# Patient Record
Sex: Female | Born: 1985 | Race: Black or African American | Hispanic: No | Marital: Single | Smoking: Never smoker
Health system: Southern US, Community
[De-identification: ages and names within clinical notes are randomized; demographics above are authoritative.]

## PROBLEM LIST (undated history)

## (undated) ENCOUNTER — Inpatient Hospital Stay (HOSPITAL_COMMUNITY): Payer: Self-pay

## (undated) ENCOUNTER — Inpatient Hospital Stay (HOSPITAL_COMMUNITY): Payer: Medicaid Other

## (undated) DIAGNOSIS — A549 Gonococcal infection, unspecified: Secondary | ICD-10-CM

## (undated) DIAGNOSIS — D649 Anemia, unspecified: Secondary | ICD-10-CM

## (undated) DIAGNOSIS — A749 Chlamydial infection, unspecified: Secondary | ICD-10-CM

## (undated) DIAGNOSIS — R87619 Unspecified abnormal cytological findings in specimens from cervix uteri: Secondary | ICD-10-CM

## (undated) DIAGNOSIS — N390 Urinary tract infection, site not specified: Secondary | ICD-10-CM

## (undated) DIAGNOSIS — IMO0002 Reserved for concepts with insufficient information to code with codable children: Secondary | ICD-10-CM

## (undated) HISTORY — PX: NO PAST SURGERIES: SHX2092

---

## 2008-08-22 ENCOUNTER — Ambulatory Visit (HOSPITAL_COMMUNITY): Admission: RE | Admit: 2008-08-22 | Discharge: 2008-08-22 | Payer: Self-pay | Admitting: Obstetrics and Gynecology

## 2008-10-03 ENCOUNTER — Ambulatory Visit (HOSPITAL_COMMUNITY): Admission: RE | Admit: 2008-10-03 | Discharge: 2008-10-03 | Payer: Self-pay | Admitting: Obstetrics and Gynecology

## 2008-11-01 ENCOUNTER — Ambulatory Visit (HOSPITAL_COMMUNITY): Admission: RE | Admit: 2008-11-01 | Discharge: 2008-11-01 | Payer: Self-pay | Admitting: Obstetrics and Gynecology

## 2008-11-21 ENCOUNTER — Inpatient Hospital Stay (HOSPITAL_COMMUNITY): Admission: AD | Admit: 2008-11-21 | Discharge: 2008-11-21 | Payer: Self-pay | Admitting: Obstetrics and Gynecology

## 2008-11-29 ENCOUNTER — Ambulatory Visit (HOSPITAL_COMMUNITY): Admission: RE | Admit: 2008-11-29 | Discharge: 2008-11-29 | Payer: Self-pay | Admitting: Obstetrics and Gynecology

## 2008-12-06 ENCOUNTER — Inpatient Hospital Stay (HOSPITAL_COMMUNITY): Admission: AD | Admit: 2008-12-06 | Discharge: 2008-12-07 | Payer: Self-pay | Admitting: Obstetrics and Gynecology

## 2008-12-14 ENCOUNTER — Inpatient Hospital Stay (HOSPITAL_COMMUNITY): Admission: AD | Admit: 2008-12-14 | Discharge: 2008-12-15 | Payer: Self-pay | Admitting: Obstetrics and Gynecology

## 2008-12-17 ENCOUNTER — Inpatient Hospital Stay (HOSPITAL_COMMUNITY): Admission: AD | Admit: 2008-12-17 | Discharge: 2008-12-17 | Payer: Self-pay | Admitting: Obstetrics and Gynecology

## 2008-12-28 ENCOUNTER — Inpatient Hospital Stay (HOSPITAL_COMMUNITY): Admission: AD | Admit: 2008-12-28 | Discharge: 2008-12-29 | Payer: Self-pay | Admitting: Obstetrics and Gynecology

## 2010-06-29 ENCOUNTER — Encounter: Payer: Self-pay | Admitting: Obstetrics and Gynecology

## 2010-07-22 IMAGING — US US OB DETAIL+14 WK
1 series · 14 of 28 positions shown · non-contrast
Comparison: none

OBSTETRICAL ULTRASOUND:
 This ultrasound was performed in The [HOSPITAL], and the [facility] report will be stored to [REDACTED] PACS.

[Series 1: us ob detail+14 wk · 14 of 92 slices shown]
[im 4/92]
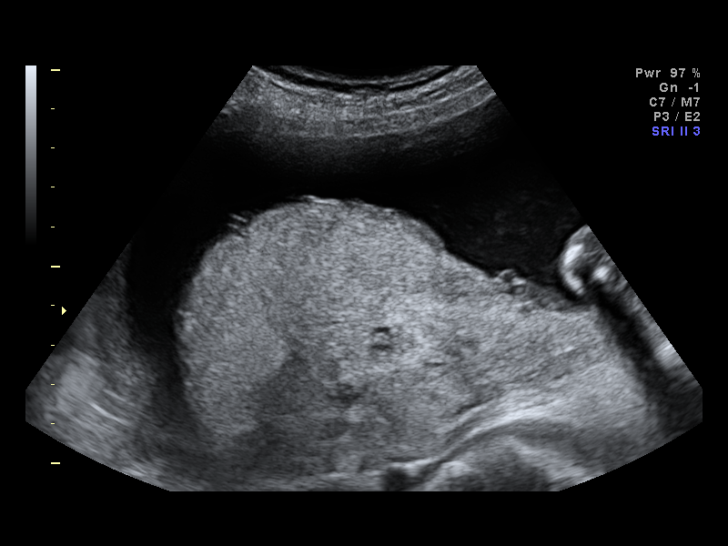
[im 11/92]
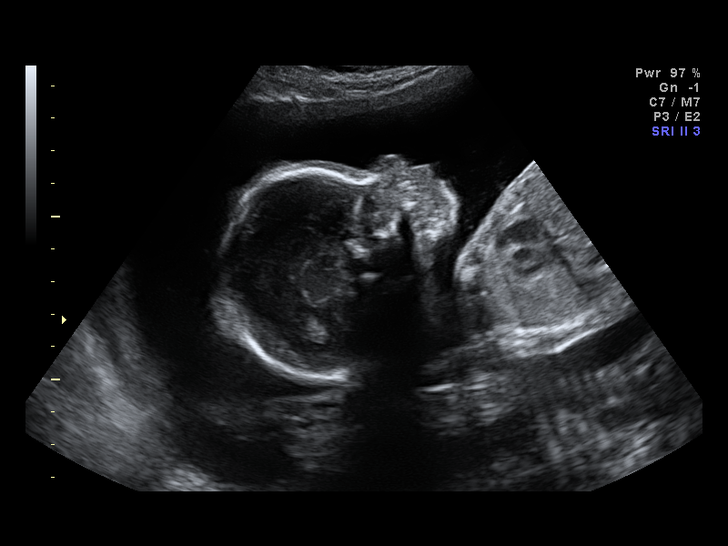
[im 17/92]
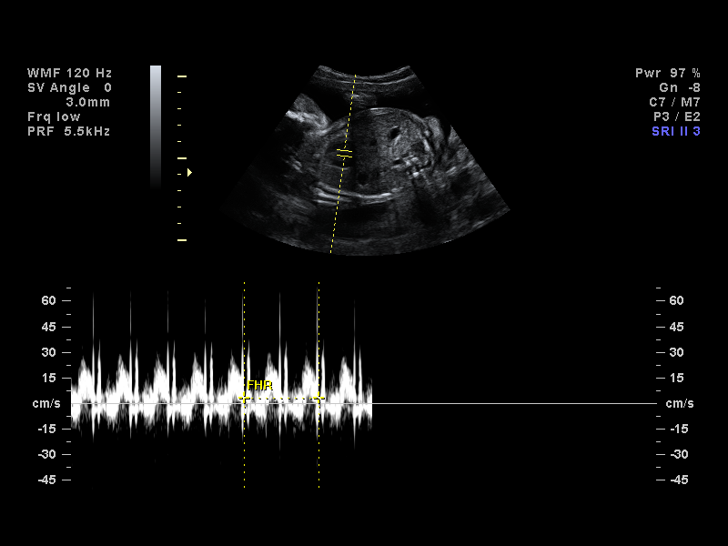
[im 24/92]
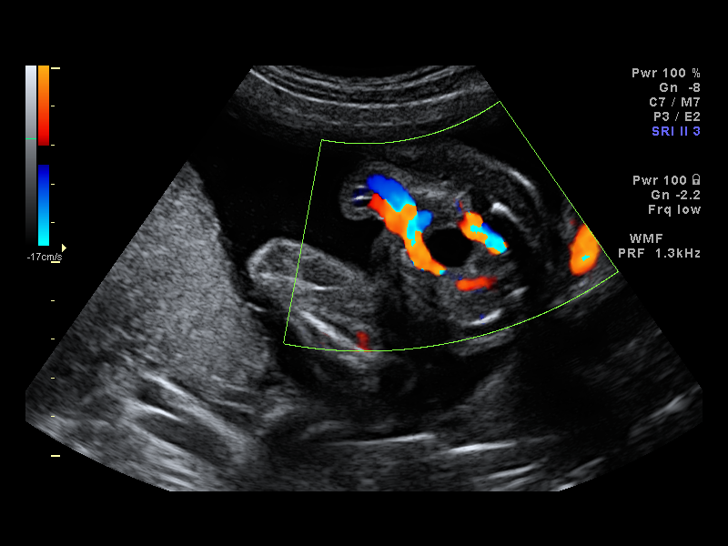
[im 31/92]
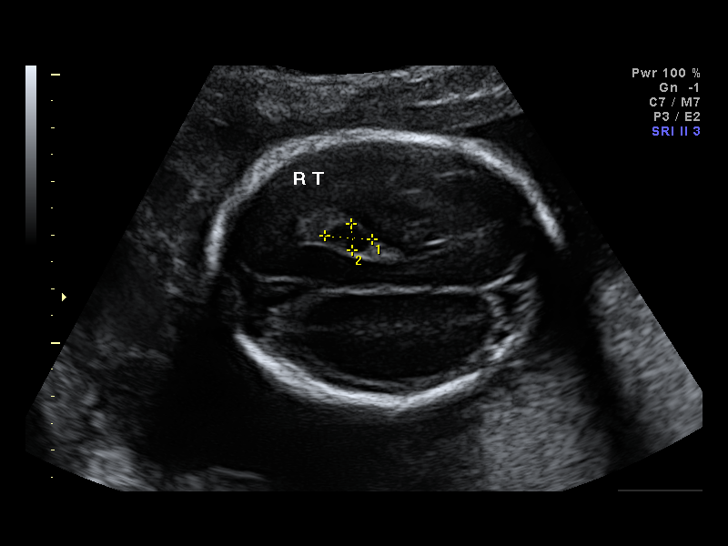
[im 38/92]
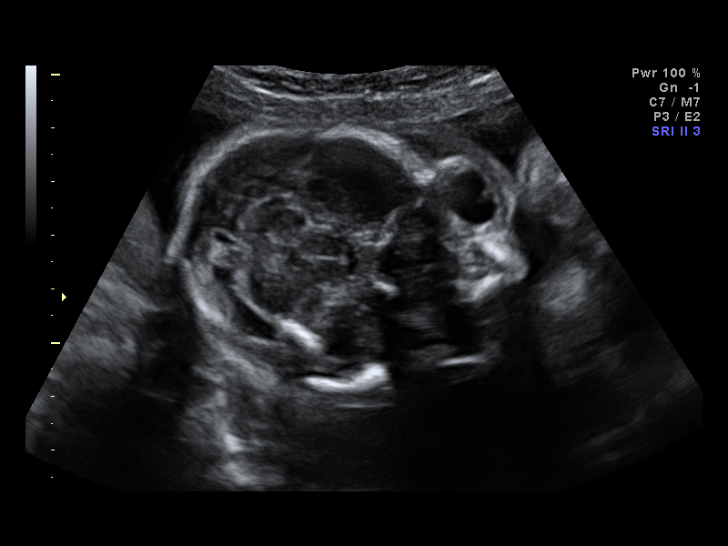
[im 44/92]
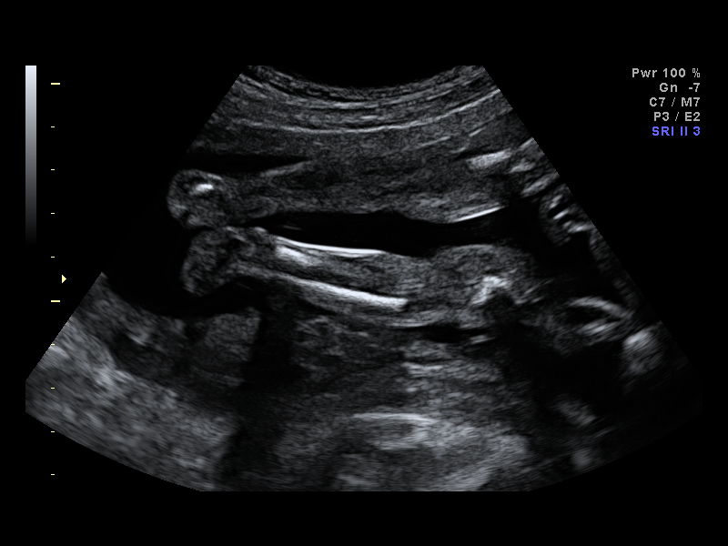
[im 51/92]
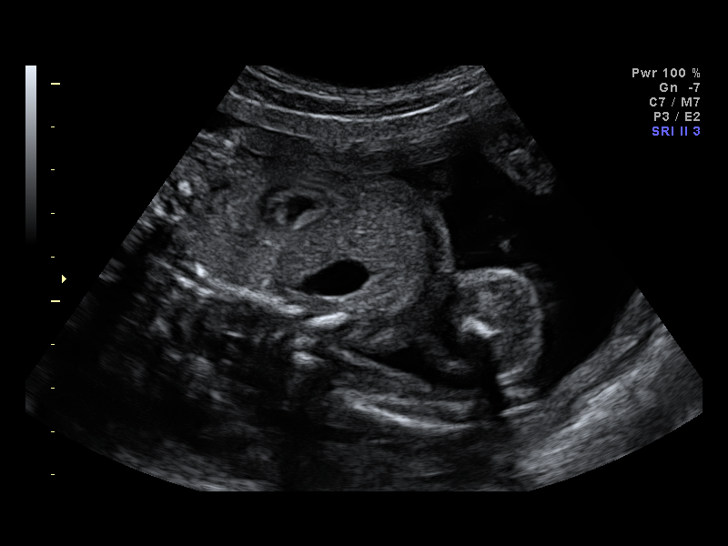
[im 58/92]
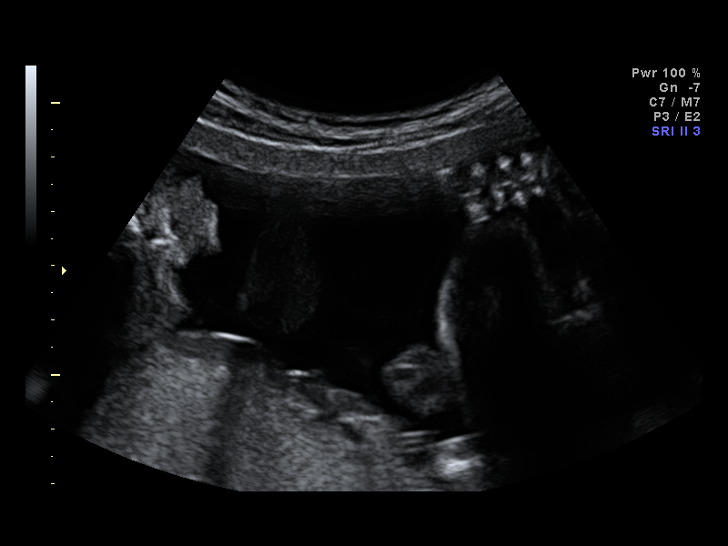
[im 65/92]
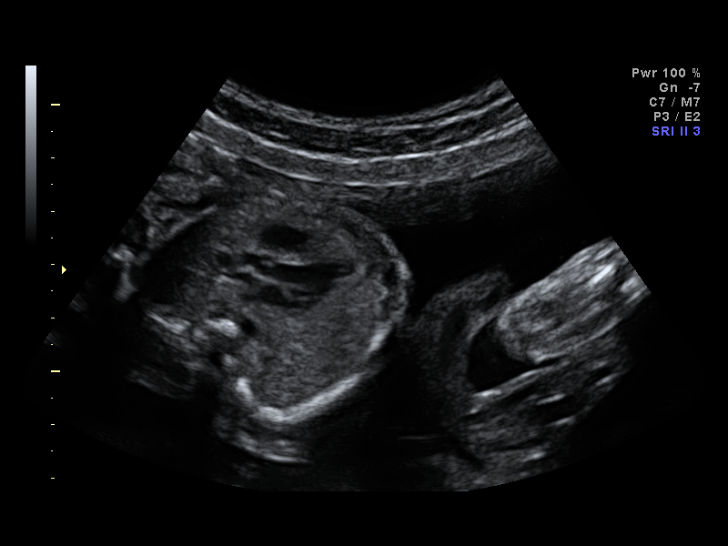
[im 71/92]
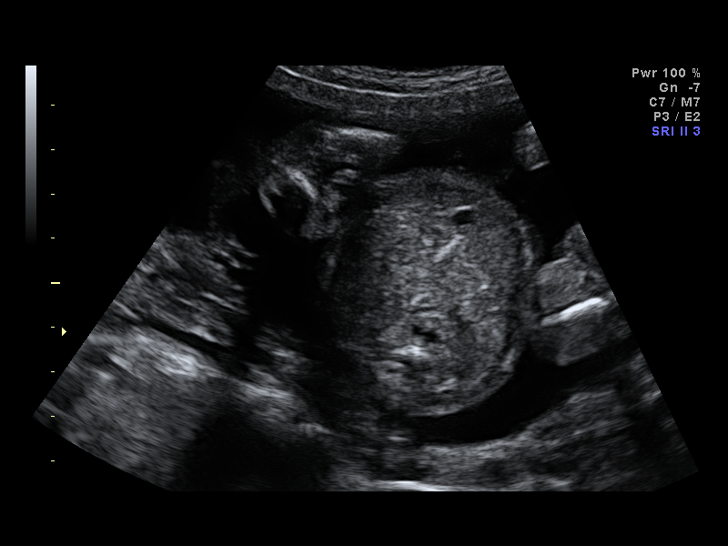
[im 78/92]
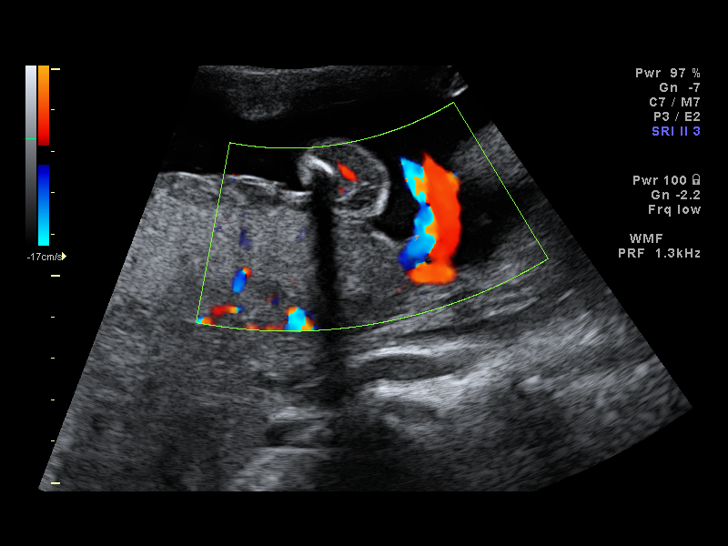
[im 85/92]
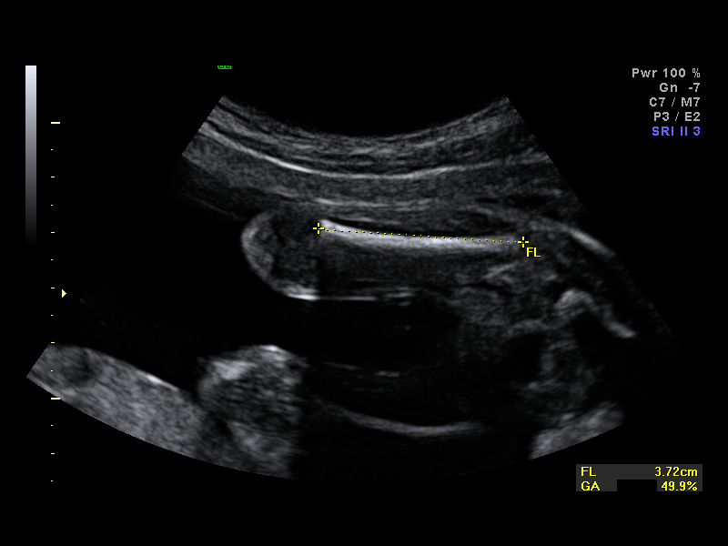
[im 92/92]
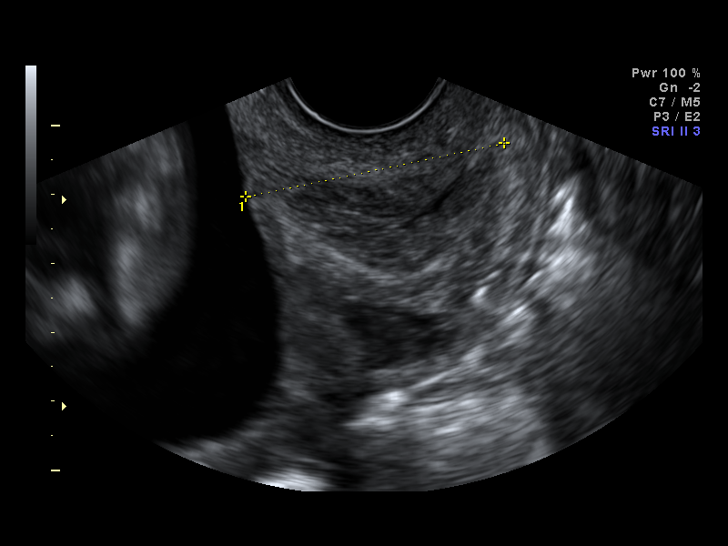

[14 of 28 positions shown; findings below may reference images not displayed]

IMPRESSION: [facility] has also been faxed to the ordering physician.

## 2010-09-14 LAB — URINALYSIS, ROUTINE W REFLEX MICROSCOPIC
Bilirubin Urine: NEGATIVE
Bilirubin Urine: NEGATIVE
Glucose, UA: NEGATIVE mg/dL
Ketones, ur: NEGATIVE mg/dL
Nitrite: NEGATIVE
Protein, ur: NEGATIVE mg/dL
Specific Gravity, Urine: 1.01 (ref 1.005–1.030)
Urobilinogen, UA: 0.2 mg/dL (ref 0.0–1.0)
pH: 7 (ref 5.0–8.0)

## 2010-09-14 LAB — CBC
HCT: 33.1 % — ABNORMAL LOW (ref 36.0–46.0)
Hemoglobin: 14.2 g/dL (ref 12.0–15.0)
MCV: 79.5 fL (ref 78.0–100.0)
RBC: 4.17 MIL/uL (ref 3.87–5.11)
RBC: 5.56 MIL/uL — ABNORMAL HIGH (ref 3.87–5.11)
WBC: 6.8 10*3/uL (ref 4.0–10.5)
WBC: 8.9 10*3/uL (ref 4.0–10.5)

## 2010-09-14 LAB — URINE MICROSCOPIC-ADD ON

## 2010-10-21 NOTE — Discharge Summary (Signed)
NAMEBROOKLYNN, BRANDENBURG NO.:  [id number]   MEDICAL RECORD NO.:  [id number]          PATIENT TYPE:  INP   LOCATION:  9124                          FACILITY:  [facility]   PHYSICIAN:  Arlyce Harman, MD     DATE OF BIRTH:  Dec 22, 1985   DATE OF ADMISSION:  12/28/2008  DATE OF DISCHARGE:  12/29/2008                               DISCHARGE SUMMARY   Ms. Carrie Bean was admitted on December 28, 2008, in active laborL Labor  progressed well and she delivered a healthy viable female with Apgars of  9 and 9. Weight was later found to be 8 pounds.  She delivered early on  the morning of December 28, 2008.   HOSPITAL COURSE:  Hospital course has been uncomplicated and she is  being discharged today in excellent condition and will be followed up in  our office in 5-6 weeks for postpartum exam.  Hemoglobin on October 23,  was 10.8, hematocrit 33.1.   FINAL DIAGNOSIS:  Term pregnancy that ended in a viable healthy female  infant via  normal spontaneous vaginal delivery.   SIGNIFICANT FINDINGS:  No episiotomy and no tears.   PROCEDURE PERFORMED:  Normal spontaneous vaginal delivery.   CONDITION:  The patient has been discharged in an excellent condition.  She is afebrile and she is voiding well and ambulatory.   Instructions given to the patient:  She is to have bedrest for the next  2-3 weeks with bathroom privileges and kitchen privileges.  She is not  to lift over 10 pounds for the next 2-3 weeks.  She is not to engage in  sexual intercourse for 6 weeks.  She is to follow up in our office in 5-  6 weeks.   MEDICATIONS:  1. Prenatal vitamin 1 a day.  2. Iron chromogen 1 a day.      Arlyce Harman, MD  Electronically Signed     EG/MEDQ  D:  12/29/2008  T:  12/29/2008  Job:  [id number]

## 2011-08-12 ENCOUNTER — Other Ambulatory Visit (HOSPITAL_COMMUNITY)
Admission: RE | Admit: 2011-08-12 | Discharge: 2011-08-12 | Disposition: A | Discharge status: Home / Self Care | Payer: Medicaid Other | Source: Ambulatory Visit | Attending: Obstetrics & Gynecology | Admitting: Obstetrics & Gynecology

## 2011-08-12 DIAGNOSIS — Z124 Encounter for screening for malignant neoplasm of cervix: Secondary | ICD-10-CM | POA: Insufficient documentation

## 2011-08-12 DIAGNOSIS — Z113 Encounter for screening for infections with a predominantly sexual mode of transmission: Secondary | ICD-10-CM | POA: Insufficient documentation

## 2011-08-25 LAB — OB RESULTS CONSOLE RUBELLA ANTIBODY, IGM: Rubella: IMMUNE

## 2011-08-25 LAB — OB RESULTS CONSOLE ANTIBODY SCREEN: Antibody Screen: NEGATIVE

## 2011-08-25 LAB — OB RESULTS CONSOLE ABO/RH: RH Type: POSITIVE

## 2011-09-11 ENCOUNTER — Ambulatory Visit: Payer: Self-pay | Admitting: Dietician

## 2011-12-31 ENCOUNTER — Inpatient Hospital Stay (HOSPITAL_COMMUNITY)
Admission: AD | Admit: 2011-12-31 | Discharge: 2012-01-01 | Disposition: A | Discharge status: Home / Self Care | Payer: Medicaid Other | Source: Ambulatory Visit | Attending: Obstetrics & Gynecology | Admitting: Obstetrics & Gynecology

## 2011-12-31 ENCOUNTER — Encounter (HOSPITAL_COMMUNITY): Payer: Self-pay | Admitting: *Deleted

## 2011-12-31 DIAGNOSIS — N760 Acute vaginitis: Secondary | ICD-10-CM | POA: Insufficient documentation

## 2011-12-31 DIAGNOSIS — N72 Inflammatory disease of cervix uteri: Secondary | ICD-10-CM | POA: Insufficient documentation

## 2011-12-31 DIAGNOSIS — O479 False labor, unspecified: Secondary | ICD-10-CM

## 2011-12-31 DIAGNOSIS — O99891 Other specified diseases and conditions complicating pregnancy: Secondary | ICD-10-CM | POA: Insufficient documentation

## 2011-12-31 DIAGNOSIS — B9689 Other specified bacterial agents as the cause of diseases classified elsewhere: Secondary | ICD-10-CM | POA: Insufficient documentation

## 2011-12-31 DIAGNOSIS — O2390 Unspecified genitourinary tract infection in pregnancy, unspecified trimester: Secondary | ICD-10-CM | POA: Insufficient documentation

## 2011-12-31 DIAGNOSIS — R209 Unspecified disturbances of skin sensation: Secondary | ICD-10-CM | POA: Insufficient documentation

## 2011-12-31 DIAGNOSIS — Z3490 Encounter for supervision of normal pregnancy, unspecified, unspecified trimester: Secondary | ICD-10-CM

## 2011-12-31 DIAGNOSIS — N949 Unspecified condition associated with female genital organs and menstrual cycle: Secondary | ICD-10-CM | POA: Insufficient documentation

## 2011-12-31 DIAGNOSIS — A499 Bacterial infection, unspecified: Secondary | ICD-10-CM | POA: Insufficient documentation

## 2011-12-31 LAB — WET PREP, GENITAL

## 2011-12-31 NOTE — MAU Note (Signed)
PT SAYS AT 8PM - SHE STARTED HAVING NUMBNESS IN L ELBOW-  WENT TO HER WRIST - THEN DOWN L OUTER SIDE.  BUT NOW--    ALL NUMBNESS IS GONE.       SAYS  X1 MTH HAS HAD PAIN/ PRESSURE IN VAGINA-   WHEN SHE WALKS .   WAS IN OFFICE ON Monday FOR P- SHOTS- TOLD THEM ABOUT ABOUT VAGINA-   VE - CLOSED.      PT CALLED EMS TONIGHT - ASSESSED- BUT MOM BROUGHT HER TO HOSPITAL.

## 2011-12-31 NOTE — MAU Provider Note (Signed)
History     CSN: [id number]  Arrival date and time: 12/31/11 2136   First Provider Initiated Contact with Patient 12/31/11 2316      Chief Complaint  Patient presents with  . Numbness    on left side  . Abdominal Pain    vaginal pressure   HPI Carrie Bean is a 26 y.o. female @ [redacted]w[redacted]d gestation who presents to MAU for vaginal pain. She describes the pain as sharp. The pain has been off and on for over a month. She told her doctor about it at her last office visit and he checked her and assured her that everything was normal and cervix closed. Tonight she called EMS to her home because she had an episode of tingling in her left elbow that radiated to her hand and up to her shoulder. She also had tingling in her left side. The pain resolved when EMS got there. They did their evaluation and told her her blood pressure was normal and she checked out ok. Her mother drove her to MAU. She denies any pain, numbness or tingling. She denies vaginal bleeding or leaking of fluid. The history was provided by the patient.  OB History    Grav Para Term Preterm Abortions TAB SAB Ect Mult Living   3 2 1 1      2       Past Medical History  Diagnosis Date  . No pertinent past medical history     Past Surgical History  Procedure Date  . No past surgeries     Family History  Problem Relation Age of Onset  . Other Neg Hx     History  Substance Use Topics  . Smoking status: Never Smoker   . Smokeless tobacco: Not on file  . Alcohol Use: No    Allergies: No Known Allergies  Prescriptions prior to admission  Medication Sig Dispense Refill  . Prenatal Vit-Fe Fumarate-FA (PRENATAL MULTIVITAMIN) TABS Take 1 tablet by mouth daily.        ROS: As stated in HPI    Blood pressure 112/64, pulse 95, temperature 98.2 F (36.8 C), temperature source Oral, resp. rate 16, height 5\' 10"  (1.778 m), weight 160 lb 3.2 oz (72.666 kg).  Physical Exam  Nursing note and vitals  reviewed. Constitutional: She is oriented to person, place, and time. She appears well-developed and well-nourished. No distress.  HENT:  Head: Normocephalic and atraumatic.  Eyes: EOM are normal.  Neck: Neck supple.  Cardiovascular: Normal rate.   Respiratory: Effort normal.  GI: Soft. There is no tenderness.  Genitourinary:       External genitalia without lesions. White discharge vaginal vault. Cervix inflamed, positive CMT, closed, thick, high. Uterus consistent with dates.  Musculoskeletal: Normal range of motion.  Neurological: She is alert and oriented to person, place, and time.  Skin: Skin is warm and dry.  Psychiatric: She has a normal mood and affect. Her behavior is normal. Judgment and thought content normal.   Results for orders placed during the hospital encounter of 12/31/11 (from the past 24 hour(s))  WET PREP, GENITAL     Status: Abnormal   Collection Time   12/31/11 11:20 PM      Component Value Range   Yeast Wet Prep HPF POC NONE SEEN  NONE SEEN   Trich, Wet Prep NONE SEEN  NONE SEEN   Clue Cells Wet Prep HPF POC MODERATE (*) NONE SEEN   WBC, Wet Prep HPF POC MANY (*) NONE  SEEN    Assessment: 26 y.o. female @ [redacted]w[redacted]d gestation with    Cervicitis   Bacterial vaginosis    Vaginal pain  Plan:  Rx Flagyl   Zithromax 1 gram po now   GC, Chlamydia cultures pending   Call the office for follow up, return here as needed.  I have reviewed this patient's vital signs, nurses notes and appropriate labs. I have discussed the clinical and lab finding with Dr. Christell Constant. He request we treat the patient for BV and have her follow up in the office.  I have discussed the results with the patient and plan of care. Patient voices understanding.   Procedures EFM baseline 130's, reactive tracing, 2 contractions in one hour   NEESE,HOPE, RN, FNP, [facility] 12/31/2011, 11:36 PM

## 2011-12-31 NOTE — MAU Note (Signed)
Home resting and left arm went numb.  Having abdominal soreness and vaginal pressure.  Started about 1 hour ago

## 2012-01-01 LAB — GC/CHLAMYDIA PROBE AMP, GENITAL: Chlamydia, DNA Probe: NEGATIVE

## 2012-01-01 MED ORDER — AZITHROMYCIN 250 MG PO TABS
1000.0000 mg | ORAL_TABLET | Freq: Once | ORAL | Status: AC
  Administered 2012-01-01: 1000 mg via ORAL
  Filled 2012-01-01: qty 4

## 2012-01-01 MED ORDER — METRONIDAZOLE 500 MG PO TABS: 500.0000 mg | ORAL_TABLET | Freq: Two times a day (BID) | ORAL | Status: AC

## 2012-01-01 NOTE — MAU Provider Note (Signed)
Agree with treatment and plan.

## 2012-01-07 ENCOUNTER — Inpatient Hospital Stay (HOSPITAL_COMMUNITY)
Admission: AD | Admit: 2012-01-07 | Discharge: 2012-01-07 | Disposition: A | Discharge status: Home / Self Care | Payer: Medicaid Other | Source: Ambulatory Visit | Attending: Obstetrics & Gynecology | Admitting: Obstetrics & Gynecology

## 2012-01-07 DIAGNOSIS — O4700 False labor before 37 completed weeks of gestation, unspecified trimester: Secondary | ICD-10-CM | POA: Insufficient documentation

## 2012-01-07 MED ORDER — BETAMETHASONE SOD PHOS & ACET 6 (3-3) MG/ML IJ SUSP
12.0000 mg | Freq: Once | INTRAMUSCULAR | Status: AC
  Administered 2012-01-07: 12 mg via INTRAMUSCULAR
  Filled 2012-01-07: qty 2

## 2012-01-08 ENCOUNTER — Encounter (HOSPITAL_COMMUNITY): Payer: Self-pay | Admitting: *Deleted

## 2012-01-08 ENCOUNTER — Inpatient Hospital Stay (HOSPITAL_COMMUNITY)
Admission: AD | Admit: 2012-01-08 | Discharge: 2012-01-08 | Disposition: A | Discharge status: Home / Self Care | Payer: Medicaid Other | Source: Ambulatory Visit | Attending: Obstetrics and Gynecology | Admitting: Obstetrics and Gynecology

## 2012-01-08 DIAGNOSIS — R109 Unspecified abdominal pain: Secondary | ICD-10-CM | POA: Insufficient documentation

## 2012-01-08 DIAGNOSIS — O99891 Other specified diseases and conditions complicating pregnancy: Secondary | ICD-10-CM | POA: Insufficient documentation

## 2012-01-08 DIAGNOSIS — O26899 Other specified pregnancy related conditions, unspecified trimester: Secondary | ICD-10-CM

## 2012-01-08 DIAGNOSIS — R1030 Lower abdominal pain, unspecified: Secondary | ICD-10-CM

## 2012-01-08 DIAGNOSIS — O9989 Other specified diseases and conditions complicating pregnancy, childbirth and the puerperium: Secondary | ICD-10-CM

## 2012-01-08 MED ORDER — BETAMETHASONE SOD PHOS & ACET 6 (3-3) MG/ML IJ SUSP
12.0000 mg | Freq: Once | INTRAMUSCULAR | Status: AC
  Administered 2012-01-08: 12 mg via INTRAMUSCULAR
  Filled 2012-01-08: qty 2

## 2012-01-08 MED ORDER — LACTATED RINGERS IV SOLN
Freq: Once | INTRAVENOUS | Status: AC
  Administered 2012-01-08: 13:00:00 via INTRAVENOUS

## 2012-01-08 NOTE — MAU Provider Note (Signed)
  History     CSN: [id number]  Arrival date and time: 01/08/12 1019   First Provider Initiated Contact with Patient 01/08/12 1115      Chief Complaint  Patient presents with  . Abdominal Pain   HPI Carrie Bean 25 y.o. [redacted]w[redacted]d  Here for lower abdominal cramping. Was seen yesterday in the office for same. Was given Betamethasone and is to have second dose today. Was not put on tocolytic.  Was 2-3 cm yesterday. Denies leaking or bleeding.   OB History    Grav Para Term Preterm Abortions TAB SAB Ect Mult Living   3 2 1 1      2       Past Medical History  Diagnosis Date  . No pertinent past medical history     Past Surgical History  Procedure Date  . No past surgeries     Family History  Problem Relation Age of Onset  . Other Neg Hx     History  Substance Use Topics  . Smoking status: Never Smoker   . Smokeless tobacco: Not on file  . Alcohol Use: No    Allergies: No Known Allergies  Prescriptions prior to admission  Medication Sig Dispense Refill  . clotrimazole-betamethasone (LOTRISONE) cream Apply 1 application topically 2 (two) times daily.      . fluconazole (DIFLUCAN) 150 MG tablet Take 150 mg by mouth once.      . metroNIDAZOLE (FLAGYL) 500 MG tablet Take 1 tablet (500 mg total) by mouth 2 (two) times daily.  14 tablet  0  . Prenatal Vit-Fe Fumarate-FA (PRENATAL MULTIVITAMIN) TABS Take 1 tablet by mouth daily.        ROS Physical Exam   Blood pressure 111/66, pulse 108, temperature 98.4 F (36.9 C), temperature source Oral, height 5\' 9"  (1.753 m), weight 157 lb 6.4 oz (71.396 kg), SpO2 99.00%.  Physical Exam  Constitutional: She is oriented to person, place, and time. She appears well-developed and well-nourished. No distress.  Cardiovascular: Normal rate.   Respiratory: Effort normal.  GI: Soft. She exhibits no distension and no mass. There is no tenderness. There is no rebound and no guarding.  Musculoskeletal: Normal range of motion.    Neurological: She is alert and oriented to person, place, and time.  Skin: Skin is warm and dry.  Psychiatric: She has a normal mood and affect.        MAU Course  Procedures  MDM Discussed with Dr Neva Seat who ordered IV hydration >> UCs almost totally stopped after hydration. FHR remained reassuring. Discussed with Dr Neva Seat. Will discharge and have her followup in office.  Assessment and Plan  A:  SIUP at [redacted]w[redacted]d       Uterine irritability       P:  Discharge home      Followup in office  BURLESON,TERRI 01/08/2012, 12:13 PM

## 2012-01-08 NOTE — MAU Note (Signed)
Patient states she has been having abdominal pain all over the abdomen since about 0500. States she was seen in the office 8-1 and was 2-3 cm and sent to MAU for her first Betamethasone injection, second due today at about 1630. Denies any bleeding and reports good fetal movement.

## 2012-01-08 NOTE — MAU Note (Signed)
Pt presents for worsening abdominal pain that started at 0445 this morning.  She received first dose of BMZ yesterday, and was to return today at 1645 today for second dose.  Denies any LOF or bleeding.  Reports good fetal movement.

## 2012-01-19 ENCOUNTER — Inpatient Hospital Stay (HOSPITAL_COMMUNITY)
Admission: AD | Admit: 2012-01-19 | Discharge: 2012-01-19 | Disposition: A | Discharge status: Home / Self Care | Payer: Medicaid Other | Source: Ambulatory Visit | Attending: Obstetrics and Gynecology | Admitting: Obstetrics and Gynecology

## 2012-01-19 ENCOUNTER — Encounter (HOSPITAL_COMMUNITY): Payer: Self-pay | Admitting: *Deleted

## 2012-01-19 DIAGNOSIS — R109 Unspecified abdominal pain: Secondary | ICD-10-CM | POA: Insufficient documentation

## 2012-01-19 DIAGNOSIS — O99891 Other specified diseases and conditions complicating pregnancy: Secondary | ICD-10-CM | POA: Insufficient documentation

## 2012-01-19 DIAGNOSIS — N949 Unspecified condition associated with female genital organs and menstrual cycle: Secondary | ICD-10-CM | POA: Insufficient documentation

## 2012-01-19 HISTORY — DX: Reserved for concepts with insufficient information to code with codable children: IMO0002

## 2012-01-19 HISTORY — DX: Urinary tract infection, site not specified: N39.0

## 2012-01-19 HISTORY — DX: Chlamydial infection, unspecified: A74.9

## 2012-01-19 HISTORY — DX: Gonococcal infection, unspecified: A54.9

## 2012-01-19 HISTORY — DX: Unspecified abnormal cytological findings in specimens from cervix uteri: R87.619

## 2012-01-19 LAB — URINE MICROSCOPIC-ADD ON

## 2012-01-19 LAB — URINALYSIS, ROUTINE W REFLEX MICROSCOPIC
Nitrite: NEGATIVE
Specific Gravity, Urine: 1.02 (ref 1.005–1.030)
pH: 6.5 (ref 5.0–8.0)

## 2012-01-19 LAB — FETAL FIBRONECTIN: Fetal Fibronectin: NEGATIVE

## 2012-01-19 NOTE — MAU Note (Signed)
Sharp pains and pressure in vagina. Has been on bedrest, pelvic rest. Brownish d/c today.

## 2012-01-19 NOTE — MAU Note (Signed)
Patient states she has been 3 cm and on bedrest. Received progesterone shots weekly.

## 2012-01-19 NOTE — MAU Provider Note (Signed)
History     CSN: [id number]  Arrival date and time: 01/19/12 1327   First Provider Initiated Contact with Patient 01/19/12 1512      Chief Complaint  Patient presents with  . Abdominal Pain  . Vaginal Pain   HPI 26 y.o. [id number] at [redacted]w[redacted]d c/o vaginal pain and pressure, denies contractions, bleeding, LOF. Pain increased with movement. H/O preterm labor this pregnancy, cervix 3 cm last week, negative FFN either last week or the week prior per patient, on bedrest/pelvic rest at home, has had BMZ this pregnancy. H/O 36 week and term delivery. On 17P injections.     Past Medical History  Diagnosis Date  . Preterm labor   . Urinary tract infection   . Abnormal Pap smear     f/u was normal  . Gonorrhea   . Chlamydia     Past Surgical History  Procedure Date  . No past surgeries     Family History  Problem Relation Age of Onset  . Other Neg Hx   . Hearing loss Neg Hx     History  Substance Use Topics  . Smoking status: Never Smoker   . Smokeless tobacco: Never Used  . Alcohol Use: No    Allergies: No Known Allergies  Prescriptions prior to admission  Medication Sig Dispense Refill  . acetaminophen (TYLENOL) 325 MG tablet Take 650 mg by mouth every 6 (six) hours as needed.      . clotrimazole-betamethasone (LOTRISONE) cream Apply 1 application topically 2 (two) times daily.      . fluconazole (DIFLUCAN) 150 MG tablet Take 150 mg by mouth once.      . Prenatal Vit-Fe Fumarate-FA (PRENATAL MULTIVITAMIN) TABS Take 1 tablet by mouth daily.        Review of Systems  Constitutional: Negative.   Respiratory: Negative.   Cardiovascular: Negative.   Gastrointestinal: Negative for nausea, vomiting, abdominal pain, diarrhea and constipation.  Genitourinary: Negative for dysuria, urgency, frequency, hematuria and flank pain.       Negative for vaginal bleeding, cramping/contractions  Musculoskeletal: Negative.   Neurological: Negative.   Psychiatric/Behavioral: Negative.      Physical Exam   Blood pressure 118/64, pulse 97, temperature 98.2 F (36.8 C), temperature source Oral, resp. rate 16, height 5\' 8"  (1.727 m), weight 159 lb (72.122 kg), SpO2 100.00%.  Physical Exam  Nursing note and vitals reviewed. Constitutional: She is oriented to person, place, and time. She appears well-developed and well-nourished. No distress.  Cardiovascular: Normal rate.   Respiratory: Effort normal.  GI: Soft. There is no tenderness.  Genitourinary: There is no rash, tenderness or lesion on the right labia. There is no rash, tenderness or lesion on the left labia. Cervix exhibits no discharge and no friability. No bleeding around the vagina. Vaginal discharge (clear/white mucous) found.       SVE:3/thick/high  Musculoskeletal: Normal range of motion.  Neurological: She is alert and oriented to person, place, and time.  Skin: Skin is warm and dry.  Psychiatric: She has a normal mood and affect.   NST reactive, TOCO: mild irritability, one contraction MAU Course  Procedures  Results for orders placed during the hospital encounter of 01/19/12 (from the past 24 hour(s))  URINALYSIS, ROUTINE W REFLEX MICROSCOPIC     Status: Abnormal   Collection Time   01/19/12  2:20 PM      Component Value Range   Color, Urine YELLOW  YELLOW   APPearance CLEAR  CLEAR   Specific Gravity, Urine  1.020  1.005 - 1.030   pH 6.5  5.0 - 8.0   Glucose, UA NEGATIVE  NEGATIVE mg/dL   Hgb urine dipstick NEGATIVE  NEGATIVE   Bilirubin Urine NEGATIVE  NEGATIVE   Ketones, ur NEGATIVE  NEGATIVE mg/dL   Protein, ur NEGATIVE  NEGATIVE mg/dL   Urobilinogen, UA 0.2  0.0 - 1.0 mg/dL   Nitrite NEGATIVE  NEGATIVE   Leukocytes, UA TRACE (*) NEGATIVE  URINE MICROSCOPIC-ADD ON     Status: Abnormal   Collection Time   01/19/12  2:20 PM      Component Value Range   Squamous Epithelial / LPF FEW (*) RARE   WBC, UA 0-2  <3 WBC/hpf  FETAL FIBRONECTIN     Status: Normal   Collection Time   01/19/12  3:10 PM       Component Value Range   Fetal Fibronectin NEGATIVE  NEGATIVE     Assessment and Plan  26 y.o. [id number] at [redacted]w[redacted]d Round ligament pain Continue current plan of care, follow up as scheduled in office Precautions rev'd  FRAZIER,NATALIE 01/19/2012, 4:22 PM

## 2012-01-19 NOTE — MAU Note (Signed)
Patient states she started having vaginal pain about 1030 and then lower abdominal pain about 1230 and now having back pain. Reports good fetal movement, and a slight vaginal discharge.

## 2012-02-03 ENCOUNTER — Inpatient Hospital Stay (HOSPITAL_COMMUNITY)
Admission: AD | Admit: 2012-02-03 | Discharge: 2012-02-04 | Disposition: A | Discharge status: Home / Self Care | Payer: Medicaid Other | Source: Ambulatory Visit | Attending: Obstetrics & Gynecology | Admitting: Obstetrics & Gynecology

## 2012-02-03 DIAGNOSIS — O4700 False labor before 37 completed weeks of gestation, unspecified trimester: Secondary | ICD-10-CM | POA: Insufficient documentation

## 2012-02-03 DIAGNOSIS — N949 Unspecified condition associated with female genital organs and menstrual cycle: Secondary | ICD-10-CM | POA: Insufficient documentation

## 2012-02-03 DIAGNOSIS — O479 False labor, unspecified: Secondary | ICD-10-CM

## 2012-02-03 LAB — URINALYSIS, ROUTINE W REFLEX MICROSCOPIC
Bilirubin Urine: NEGATIVE
Hgb urine dipstick: NEGATIVE
Ketones, ur: NEGATIVE mg/dL
Nitrite: NEGATIVE
Urobilinogen, UA: 0.2 mg/dL (ref 0.0–1.0)

## 2012-02-03 NOTE — MAU Provider Note (Signed)
Chief Complaint:  Vaginal Pain and Rectal Pain  First Provider Initiated Contact with Patient 02/03/12 2332    HPI: Carrie Bean is a 26 y.o. [id number] at 66w1dwho presents to maternity admissions reporting constant vaginal and rectal pain. Pain started this evening around 1830. Hx PTL, 3 cm at last check. Hx PTD at 36 weeks, on 17-P. Had BMZ this pregnancy.  . Denies leakage of fluid or vaginal bleeding. Good fetal movement.   Past Medical History: Past Medical History  Diagnosis Date  . Preterm labor   . Urinary tract infection   . Abnormal Pap smear     f/u was normal  . Gonorrhea   . Chlamydia     Past obstetric history: OB History    Grav Para Term Preterm Abortions TAB SAB Ect Mult Living   3 2 1 1      2      # Outc Date GA Lbr Len/2nd Wgt Sex Del Anes PTL Lv   1 PRE     M SVD EPI     Comments: 36wks   2 TRM     M SVD  No Yes   3 CUR               Past Surgical History: Past Surgical History  Procedure Date  . No past surgeries     Family History: Family History  Problem Relation Age of Onset  . Other Neg Hx   . Hearing loss Neg Hx     Social History: History  Substance Use Topics  . Smoking status: Never Smoker   . Smokeless tobacco: Never Used  . Alcohol Use: No    Allergies: No Known Allergies  Meds:  Prescriptions prior to admission  Medication Sig Dispense Refill  . acetaminophen (TYLENOL) 325 MG tablet Take 650 mg by mouth every 6 (six) hours as needed.      . clotrimazole-betamethasone (LOTRISONE) cream Apply 1 application topically 2 (two) times daily.      . fluconazole (DIFLUCAN) 150 MG tablet Take 150 mg by mouth once.      . Prenatal Vit-Fe Fumarate-FA (PRENATAL MULTIVITAMIN) TABS Take 1 tablet by mouth daily.        ROS: Pertinent findings in history of present illness.  Physical Exam  Blood pressure 116/67, pulse 95, temperature 97.7 F (36.5 C), temperature source Oral, resp. rate 18, height 5\' 10"  (1.778 m), weight 73.846 kg  (162 lb 12.8 oz). GENERAL: Well-developed, well-nourished female in no acute distress.  HEENT: normocephalic HEART: normal rate RESP: normal effort ABDOMEN: Soft, non-tender, gravid appropriate for gestational age EXTREMITIES: Nontender, no edema NEURO: alert and oriented SPECULUM EXAM: deferred  Dilation: 3 Effacement (%): Thick Cervical Position: Anterior Station: -1 Presentation: Vertex Exam by:: Ivonne Andrew CNM  FHT:  Baseline 120 , moderate variability, accelerations present, no decelerations Contractions:UI   Labs: Results for orders placed during the hospital encounter of 02/03/12 (from the past 24 hour(s))  URINALYSIS, ROUTINE W REFLEX MICROSCOPIC     Status: Normal   Collection Time   02/03/12 10:25 PM      Component Value Range   Color, Urine YELLOW  YELLOW   APPearance CLEAR  CLEAR   Specific Gravity, Urine 1.015  1.005 - 1.030   pH 6.5  5.0 - 8.0   Glucose, UA NEGATIVE  NEGATIVE mg/dL   Hgb urine dipstick NEGATIVE  NEGATIVE   Bilirubin Urine NEGATIVE  NEGATIVE   Ketones, ur NEGATIVE  NEGATIVE mg/dL  Protein, ur NEGATIVE  NEGATIVE mg/dL   Urobilinogen, UA 0.2  0.0 - 1.0 mg/dL   Nitrite NEGATIVE  NEGATIVE   Leukocytes, UA NEGATIVE  NEGATIVE    Imaging:  No results found. ED Course No change since last cervical exam.  Assessment: 1. Preterm contractions    Plan: Discharge home per Dr. Richardson Dopp. Labor precautions and fetal kick counts Increase fluids and rest Medication List  As of 02/03/2012 11:14 PM   ASK your doctor about these medications         acetaminophen 325 MG tablet   Commonly known as: TYLENOL   Take 650 mg by mouth every 6 (six) hours as needed.      clotrimazole-betamethasone cream   Commonly known as: LOTRISONE   Apply 1 application topically 2 (two) times daily.      fluconazole 150 MG tablet   Commonly known as: DIFLUCAN   Take 150 mg by mouth once.      prenatal multivitamin Tabs   Take 1 tablet by mouth daily.            Follow-up Information    Follow up with Delbert Harness., MD. (as scheduled)    Contact information:   [address], [address] [place] [employer] [place] [address] [phone]       Follow up with [facility]. (As needed if symptoms worsen)    Contact information:   [address] [place] [address] [phone]        Dorathy Kinsman, [place] 02/03/2012 11:14 PM

## 2012-02-03 NOTE — MAU Note (Signed)
Pt complaining of vaginal and rectal pain. Pain started this evening around 1830. Pt described pain as constant

## 2012-02-03 NOTE — Progress Notes (Signed)
Pt states she sometimes have pain shooting through when she walks and when she sits down

## 2012-02-03 NOTE — MAU Note (Signed)
Pt G3 P2 at 35.1wks having vaginal and rectal pain today.  Irregular contractions and yellow discharge.  Hx PTL on bedrest.  SVE 3cm.

## 2012-02-10 NOTE — MAU Provider Note (Signed)
Agree with plan of care

## 2012-02-18 ENCOUNTER — Encounter (HOSPITAL_COMMUNITY): Payer: Self-pay | Admitting: *Deleted

## 2012-02-18 ENCOUNTER — Telehealth (HOSPITAL_COMMUNITY): Payer: Self-pay | Admitting: *Deleted

## 2012-02-18 NOTE — Telephone Encounter (Signed)
Preadmission screen  

## 2012-02-21 ENCOUNTER — Inpatient Hospital Stay (HOSPITAL_COMMUNITY)
Admission: AD | Admit: 2012-02-21 | Discharge: 2012-02-21 | Disposition: A | Discharge status: Home / Self Care | Payer: Medicaid Other | Source: Ambulatory Visit | Attending: Obstetrics & Gynecology | Admitting: Obstetrics & Gynecology

## 2012-02-21 ENCOUNTER — Encounter (HOSPITAL_COMMUNITY): Payer: Self-pay | Admitting: *Deleted

## 2012-02-21 DIAGNOSIS — O99891 Other specified diseases and conditions complicating pregnancy: Secondary | ICD-10-CM | POA: Insufficient documentation

## 2012-02-21 DIAGNOSIS — N949 Unspecified condition associated with female genital organs and menstrual cycle: Secondary | ICD-10-CM | POA: Insufficient documentation

## 2012-02-21 NOTE — MAU Note (Addendum)
Pt reports having a runny discharge for 2 day Now having some bloody discharge as well.. Having sharp pain in her vaginal on and off as well.

## 2012-02-21 NOTE — MAU Note (Signed)
Blood streaked d/c. Increased watery discharge past couple days.  Has been 4 cm dilated. Is for induction on 09/24. Hx of PTL with  Preg, was getting progesterone injection, received betamethasone injections.

## 2012-02-26 ENCOUNTER — Other Ambulatory Visit: Payer: Self-pay | Admitting: Obstetrics & Gynecology

## 2012-02-26 NOTE — H&P (Signed)
  Carrie Bean is a 26 y.o. female presenting for elective induction of labor after successfully taking the pregnancy to term after first pregnancy delivered at 36 weeks and the subsequent pregnancy delivered at 40 weeks and this pregnancy she has received the 17 hydroxyprogesterone injections weekly until 36 weeks.  She now would like to be delivered at her earliest possible convenience.  The increased risk of inducing delivery with regard to the increased operative delivery including cesarean delivery.  Wishes to go ahead and proceed with induction of labor.  I did tell her that I thought there: Exam which was was last checked on 02/16/2012 noted to be 4/80/-2/vertex/soft/anterior 40 Bishop score of 10.  She wishes to proceed all questions were answered.    OB History    Grav Para Term Preterm Abortions TAB SAB Ect Mult Living   3 2 1 1      2      Past Medical History  Diagnosis Date  . Preterm labor   . Urinary tract infection   . Gonorrhea   . Chlamydia   . Abnormal Pap smear     f/u was normal   Past Surgical History  Procedure Date  . No past surgeries    Family History: family history is negative for Other and Hearing loss. Social History:  reports that she has never smoked. She has never used smokeless tobacco. She reports that she does not drink alcohol or use illicit drugs.  Review of Systems  Constitutional: Negative.  Negative for fever and chills.  HENT: Negative for hearing loss and tinnitus.        Mild HA   Eyes: Negative for blurred vision, double vision and photophobia.  Respiratory: Negative.  Negative for cough, hemoptysis, sputum production, shortness of breath and wheezing.   Cardiovascular: Negative.  Negative for chest pain and palpitations.  Gastrointestinal: Positive for nausea. Negative for heartburn, vomiting, abdominal pain, diarrhea, constipation, blood in stool and melena.  Genitourinary: Positive for frequency. Negative for dysuria, urgency,  hematuria and flank pain.  Musculoskeletal: Negative.  Negative for myalgias and joint pain.  Skin: Negative.  Negative for rash.  Neurological: Positive for headaches. Negative for dizziness, tingling and seizures.  Endo/Heme/Allergies: Negative.  Does not bruise/bleed easily.  Psychiatric/Behavioral: Negative.  Negative for depression and suicidal ideas.   General Appearance: alert, well appearing, in no apparent distress, oriented to person, place and time Thyroid: no thyromegaly or masses present   Mouth: mucous membranes moist, pharynx normal without lesions Breasts: no masses noted, no significant tenderness, no palpable axillary nodes, no skin changes Lungs: clear to auscultation, no wheezes, rales or rhonchi, symmetric air entry Heart: regular rate and rhythm, no murmurs Abdomen: FHT present Pelvic exam as above Back exam: no CVA tenderness  Extremities: no redness or tenderness in the calves or thighs, no edema Skin: normal coloration and turgor, no rashes   There were no vitals taken for this visit.   Prenatal labs: ABO, Rh: B/Positive/-- (03/19 0000) Antibody: Negative (03/19 0000) Rubella:   RPR: Nonreactive (03/19 0000)  HBsAg: Negative (03/19 0000)  HIV: Non-reactive (03/19 0000)  GBS:     Assessment/Plan:   IUP at 39 weeks and 0 days on 03/01/2012 after an EDC of 03/08/2012 History preterm labor Plan: Elective induction of labor with Pitocin.   Asmar Brozek H. 02/26/2012, 2:09 PM

## 2012-02-27 ENCOUNTER — Encounter (HOSPITAL_COMMUNITY): Payer: Self-pay | Admitting: Obstetrics and Gynecology

## 2012-02-27 ENCOUNTER — Inpatient Hospital Stay (HOSPITAL_COMMUNITY)
Admission: AD | Admit: 2012-02-27 | Discharge: 2012-02-27 | Disposition: A | Discharge status: Home / Self Care | Payer: Medicaid Other | Source: Ambulatory Visit | Attending: Obstetrics & Gynecology | Admitting: Obstetrics & Gynecology

## 2012-02-27 DIAGNOSIS — O479 False labor, unspecified: Secondary | ICD-10-CM | POA: Insufficient documentation

## 2012-02-27 NOTE — MAU Note (Signed)
Pt reports increased pelvic pressure and pain with contractions. reports some bloody show and good fetal moment

## 2012-02-27 NOTE — MAU Note (Signed)
"  I started UC's last night.  They are coming about every 10 minutes.  I came because of the pelvic pain and pressure.  I'm having streaks of blood in my discharge, but no frank bleeding.  No LOF.  (+) FM."

## 2012-03-01 ENCOUNTER — Encounter (HOSPITAL_COMMUNITY): Payer: Self-pay | Admitting: Anesthesiology

## 2012-03-01 ENCOUNTER — Encounter (HOSPITAL_COMMUNITY): Payer: Self-pay

## 2012-03-01 ENCOUNTER — Inpatient Hospital Stay (HOSPITAL_COMMUNITY)
Admission: RE | Admit: 2012-03-01 | Discharge: 2012-03-03 | DRG: 775 | Disposition: A | Discharge status: Home / Self Care | Payer: Medicaid Other | Source: Ambulatory Visit | Attending: Obstetrics & Gynecology | Admitting: Obstetrics & Gynecology

## 2012-03-01 ENCOUNTER — Inpatient Hospital Stay (HOSPITAL_COMMUNITY): Payer: Medicaid Other | Admitting: Anesthesiology

## 2012-03-01 LAB — CBC
Hemoglobin: 10.6 g/dL — ABNORMAL LOW (ref 12.0–15.0)
MCH: 24 pg — ABNORMAL LOW (ref 26.0–34.0)
MCV: 76.9 fL — ABNORMAL LOW (ref 78.0–100.0)
RBC: 4.41 MIL/uL (ref 3.87–5.11)
WBC: 7 10*3/uL (ref 4.0–10.5)

## 2012-03-01 LAB — TYPE AND SCREEN
ABO/RH(D): B POS
Antibody Screen: NEGATIVE

## 2012-03-01 LAB — RPR: RPR Ser Ql: NONREACTIVE

## 2012-03-01 MED ORDER — SODIUM CHLORIDE 0.9 % IJ SOLN: 3.0000 mL | INTRAMUSCULAR | Status: DC | PRN

## 2012-03-01 MED ORDER — IBUPROFEN 600 MG PO TABS
600.0000 mg | ORAL_TABLET | Freq: Four times a day (QID) | ORAL | Status: DC
  Administered 2012-03-02 – 2012-03-03 (×6): 600 mg via ORAL
  Filled 2012-03-01 (×7): qty 1

## 2012-03-01 MED ORDER — SODIUM CHLORIDE 0.9 % IV SOLN: 250.0000 mL | INTRAVENOUS | Status: DC | PRN

## 2012-03-01 MED ORDER — EPHEDRINE 5 MG/ML INJ
10.0000 mg | INTRAVENOUS | Status: DC | PRN
  Filled 2012-03-01: qty 4

## 2012-03-01 MED ORDER — BUTORPHANOL TARTRATE 1 MG/ML IJ SOLN: 1.0000 mg | INTRAMUSCULAR | Status: DC | PRN

## 2012-03-01 MED ORDER — PRENATAL MULTIVITAMIN CH
1.0000 | ORAL_TABLET | Freq: Every day | ORAL | Status: DC
  Administered 2012-03-02 – 2012-03-03 (×2): 1 via ORAL
  Filled 2012-03-01 (×2): qty 1

## 2012-03-01 MED ORDER — EPHEDRINE 5 MG/ML INJ: 10.0000 mg | INTRAVENOUS | Status: DC | PRN

## 2012-03-01 MED ORDER — PHENYLEPHRINE 40 MCG/ML (10ML) SYRINGE FOR IV PUSH (FOR BLOOD PRESSURE SUPPORT)
80.0000 ug | PREFILLED_SYRINGE | INTRAVENOUS | Status: DC | PRN
  Filled 2012-03-01: qty 5

## 2012-03-01 MED ORDER — WITCH HAZEL-GLYCERIN EX PADS: 1.0000 "application " | MEDICATED_PAD | CUTANEOUS | Status: DC | PRN

## 2012-03-01 MED ORDER — TETANUS-DIPHTH-ACELL PERTUSSIS 5-2.5-18.5 LF-MCG/0.5 IM SUSP: 0.5000 mL | Freq: Once | INTRAMUSCULAR | Status: DC

## 2012-03-01 MED ORDER — HYDROXYZINE HCL 50 MG PO TABS: 50.0000 mg | ORAL_TABLET | Freq: Four times a day (QID) | ORAL | Status: DC | PRN

## 2012-03-01 MED ORDER — LACTATED RINGERS IV SOLN: 500.0000 mL | Freq: Once | INTRAVENOUS | Status: DC

## 2012-03-01 MED ORDER — ONDANSETRON HCL 4 MG/2ML IJ SOLN: 4.0000 mg | Freq: Four times a day (QID) | INTRAMUSCULAR | Status: DC | PRN

## 2012-03-01 MED ORDER — PHENYLEPHRINE 40 MCG/ML (10ML) SYRINGE FOR IV PUSH (FOR BLOOD PRESSURE SUPPORT): 80.0000 ug | PREFILLED_SYRINGE | INTRAVENOUS | Status: DC | PRN

## 2012-03-01 MED ORDER — ZOLPIDEM TARTRATE 5 MG PO TABS: 5.0000 mg | ORAL_TABLET | Freq: Every evening | ORAL | Status: DC | PRN

## 2012-03-01 MED ORDER — HYDROXYZINE HCL 50 MG/ML IM SOLN
50.0000 mg | Freq: Four times a day (QID) | INTRAMUSCULAR | Status: DC | PRN
  Filled 2012-03-01: qty 1

## 2012-03-01 MED ORDER — OXYTOCIN BOLUS FROM INFUSION
500.0000 mL | Freq: Once | INTRAVENOUS | Status: AC
  Administered 2012-03-01: 500 mL via INTRAVENOUS
  Filled 2012-03-01: qty 500

## 2012-03-01 MED ORDER — LIDOCAINE HCL (PF) 1 % IJ SOLN
INTRAMUSCULAR | Status: DC | PRN
  Administered 2012-03-01 (×4): 4 mL

## 2012-03-01 MED ORDER — LIDOCAINE HCL (PF) 1 % IJ SOLN
30.0000 mL | INTRAMUSCULAR | Status: DC | PRN
  Filled 2012-03-01: qty 30

## 2012-03-01 MED ORDER — SODIUM CHLORIDE 0.9 % IJ SOLN: 3.0000 mL | Freq: Two times a day (BID) | INTRAMUSCULAR | Status: DC

## 2012-03-01 MED ORDER — TERBUTALINE SULFATE 1 MG/ML IJ SOLN: 0.2500 mg | Freq: Once | INTRAMUSCULAR | Status: DC | PRN

## 2012-03-01 MED ORDER — LACTATED RINGERS IV SOLN: 500.0000 mL | INTRAVENOUS | Status: DC | PRN

## 2012-03-01 MED ORDER — FENTANYL 2.5 MCG/ML BUPIVACAINE 1/10 % EPIDURAL INFUSION (WH - ANES)
14.0000 mL/h | INTRAMUSCULAR | Status: DC
  Administered 2012-03-01: 16 mL/h via EPIDURAL
  Administered 2012-03-01: 14 mL/h via EPIDURAL
  Filled 2012-03-01 (×2): qty 60

## 2012-03-01 MED ORDER — ONDANSETRON HCL 4 MG PO TABS: 4.0000 mg | ORAL_TABLET | ORAL | Status: DC | PRN

## 2012-03-01 MED ORDER — OXYTOCIN 40 UNITS IN LACTATED RINGERS INFUSION - SIMPLE MED
62.5000 mL/h | Freq: Once | INTRAVENOUS | Status: AC
  Administered 2012-03-01: 62.5 mL/h via INTRAVENOUS

## 2012-03-01 MED ORDER — DIPHENHYDRAMINE HCL 25 MG PO CAPS: 25.0000 mg | ORAL_CAPSULE | Freq: Four times a day (QID) | ORAL | Status: DC | PRN

## 2012-03-01 MED ORDER — CITRIC ACID-SODIUM CITRATE 334-500 MG/5ML PO SOLN: 30.0000 mL | ORAL | Status: DC | PRN

## 2012-03-01 MED ORDER — DIBUCAINE 1 % RE OINT: 1.0000 "application " | TOPICAL_OINTMENT | RECTAL | Status: DC | PRN

## 2012-03-01 MED ORDER — OXYTOCIN 40 UNITS IN LACTATED RINGERS INFUSION - SIMPLE MED
1.0000 m[IU]/min | INTRAVENOUS | Status: DC
  Administered 2012-03-01: 2 m[IU]/min via INTRAVENOUS
  Filled 2012-03-01: qty 1000

## 2012-03-01 MED ORDER — SENNOSIDES-DOCUSATE SODIUM 8.6-50 MG PO TABS
2.0000 | ORAL_TABLET | Freq: Every day | ORAL | Status: DC
  Administered 2012-03-01 – 2012-03-02 (×2): 2 via ORAL

## 2012-03-01 MED ORDER — OXYCODONE-ACETAMINOPHEN 5-325 MG PO TABS: 1.0000 | ORAL_TABLET | ORAL | Status: DC | PRN

## 2012-03-01 MED ORDER — LACTATED RINGERS IV SOLN
INTRAVENOUS | Status: DC
  Administered 2012-03-01 (×2): via INTRAVENOUS

## 2012-03-01 MED ORDER — DIPHENHYDRAMINE HCL 50 MG/ML IJ SOLN: 12.5000 mg | INTRAMUSCULAR | Status: DC | PRN

## 2012-03-01 MED ORDER — ONDANSETRON HCL 4 MG/2ML IJ SOLN: 4.0000 mg | INTRAMUSCULAR | Status: DC | PRN

## 2012-03-01 MED ORDER — IBUPROFEN 600 MG PO TABS: 600.0000 mg | ORAL_TABLET | Freq: Four times a day (QID) | ORAL | Status: DC | PRN

## 2012-03-01 MED ORDER — BENZOCAINE-MENTHOL 20-0.5 % EX AERO: 1.0000 "application " | INHALATION_SPRAY | CUTANEOUS | Status: DC | PRN

## 2012-03-01 MED ORDER — SIMETHICONE 80 MG PO CHEW: 80.0000 mg | CHEWABLE_TABLET | ORAL | Status: DC | PRN

## 2012-03-01 MED ORDER — ACETAMINOPHEN 325 MG PO TABS: 650.0000 mg | ORAL_TABLET | ORAL | Status: DC | PRN

## 2012-03-01 MED ORDER — LANOLIN HYDROUS EX OINT: TOPICAL_OINTMENT | CUTANEOUS | Status: DC | PRN

## 2012-03-01 NOTE — Progress Notes (Signed)
Carrie Bean is a 26 y.o. G3P1102 at [redacted]w[redacted]d by ultrasound admitted for induction of labor due to Elective at term.  Subjective: Doing well will get CLE and also saw pt at 0830 this am and nothing has changed except the pain has increased and Pitocin on 8 miliunits per min  Objective: BP 114/88  Pulse 94  Temp 98.2 F (36.8 C) (Oral)  Resp 20  Ht 5\' 10"  (1.778 m)  Wt 73.936 kg (163 lb)  BMI 23.39 kg/m2      FHT:  FHR: 130s bpm, variability: moderate,  accelerations:  Present,  decelerations:  Absent UC:   irregular, every 3-5 minutes SVE:   Dilation: 6.5 Effacement (%): 90 Station: -1 Exam by:: Dr. Christell Constant  Labs: Lab Results  Component Value Date   WBC 7.0 03/01/2012   HGB 10.6* 03/01/2012   HCT 33.9* 03/01/2012   MCV 76.9* 03/01/2012   PLT 191 03/01/2012    Assessment / Plan: IUP at [redacted]w[redacted]d doing well for electinve induction with proven pelvis and favorable Bishop score   Labor: Progressing on Pitocin, will continue to increase then AROM AROM just now with clear fluid Preeclampsia:  na Fetal Wellbeing:  Category I Pain Control:  Epidural I/D:  n/a Anticipated MOD:  NSVD  Carrie Bean H. 03/01/2012, 11:52 AM

## 2012-03-01 NOTE — Anesthesia Procedure Notes (Signed)
Epidural Patient location during procedure: OB Start time: 03/01/2012 11:55 AM  Staffing Performed by: anesthesiologist   Preanesthetic Checklist Completed: patient identified, site marked, surgical consent, pre-op evaluation, timeout performed, IV checked, risks and benefits discussed and monitors and equipment checked  Epidural Patient position: sitting Prep: site prepped and draped and DuraPrep Patient monitoring: continuous pulse ox and blood pressure Approach: midline Injection technique: LOR air  Needle:  Needle type: Tuohy  Needle gauge: 17 G Needle length: 9 cm and 9 Needle insertion depth: 5 cm cm Catheter type: closed end flexible Catheter size: 19 Gauge Catheter at skin depth: 10 cm Test dose: negative  Assessment Events: blood not aspirated, injection not painful, no injection resistance, negative IV test and no paresthesia  Additional Notes Discussed risk of headache, infection, bleeding, nerve injury and failed or incomplete block.  Patient voices understanding and wishes to proceed. Reason for block:procedure for pain

## 2012-03-01 NOTE — Anesthesia Postprocedure Evaluation (Signed)
  Anesthesia Post-op Note  Patient: Carrie Bean  Procedure(s) Performed: * No procedures listed *  Patient Location: Mother/Baby  Anesthesia Type: Epidural  Level of Consciousness: awake  Airway and Oxygen Therapy: Patient Spontanous Breathing  Post-op Pain: mild  Post-op Assessment: Patient's Cardiovascular Status Stable and Respiratory Function Stable  Post-op Vital Signs: stable  Complications: No apparent anesthesia complications

## 2012-03-01 NOTE — Op Note (Signed)
Delivery Note At 2:47 PM a viable and healthy female was delivered via Vaginal, Spontaneous Delivery (Presentation: Right Occiput Anterior).  APGAR: 8, 9; weight 7 lb 15.2 oz (3606 g).   Placenta status: Intact, Spontaneous.  Cord: 3 vessels with the following complications: None.  Cord pH: na  Anesthesia: Epidural  Episiotomy: None Lacerations: None Suture Repair: no lacerations Est. Blood Loss (mL): 250  Mom to postpartum.  Baby to nursery-stable.  Marry Kusch H. 03/01/2012, 4:11 PM

## 2012-03-01 NOTE — Anesthesia Preprocedure Evaluation (Signed)

## 2012-03-02 LAB — CBC
Hemoglobin: 9.6 g/dL — ABNORMAL LOW (ref 12.0–15.0)
MCH: 24.7 pg — ABNORMAL LOW (ref 26.0–34.0)
MCHC: 32 g/dL (ref 30.0–36.0)
Platelets: 168 10*3/uL (ref 150–400)
RDW: 15.4 % (ref 11.5–15.5)

## 2012-03-02 NOTE — Progress Notes (Signed)
Post Partum Day 1 Subjective: no complaints  Objective: Blood pressure 109/70, pulse 74, temperature 97.6 F (36.4 C), temperature source Oral, resp. rate 20, height 5\' 10"  (1.778 m), weight 73.936 kg (163 lb), SpO2 97.00%, unknown if currently breastfeeding.  Physical Exam:  General: alert, cooperative and no distress Lochia: appropriate Uterine Fundus: firm Incision: na DVT Evaluation: No evidence of DVT seen on physical exam. Negative Homan's sign. No cords or calf tenderness. No significant calf/ankle edema.   Basename 03/02/12 0540 03/01/12 0740  HGB 9.6* 10.6*  HCT 30.0* 33.9*    Assessment/Plan: Plan for discharge tomorrow   LOS: 1 day   Carrie Bean H. 03/02/2012, 9:16 AM

## 2012-03-02 NOTE — Progress Notes (Signed)
Ur chart review completed.  

## 2012-03-03 MED ORDER — INFLUENZA VIRUS VACC SPLIT PF IM SUSP
0.5000 mL | INTRAMUSCULAR | Status: AC
  Administered 2012-03-03: 0.5 mL via INTRAMUSCULAR

## 2012-03-03 NOTE — Discharge Summary (Signed)
Obstetric Discharge Summary Reason for Admission: induction of labor Prenatal Procedures: none Intrapartum Procedures: spontaneous vaginal delivery Postpartum Procedures: none Complications-Operative and Postpartum: none  Hemoglobin  Date Value Range Status  03/02/2012 9.6* 12.0 - 15.0 g/dL Final     HCT  Date Value Range Status  03/02/2012 30.0* 36.0 - 46.0 % Final    Discharge Diagnoses: Term Pregnancy-delivered  Discharge Information: Date: 03/03/2012 Activity: pelvic rest Diet: routine Medications: Ibuprofen and Iron otc. Prenatal vitamins Condition: stable Instructions: refer to practice specific booklet Discharge to: home   Newborn Data: Live born  Information for the patient's newborn:  Kilynn, Fiumara [[id number]]  female ; APGAR , ; weight ;  Home with mother.  Magnum Lunde E 03/03/2012, 10:41 AM

## 2012-03-03 NOTE — Progress Notes (Signed)
Post Partum Day 2 Subjective: no complaints  Objective: Blood pressure 110/75, pulse 80, temperature 97.7 F (36.5 C), temperature source Oral, resp. rate 18, height 5\' 10"  (1.778 m), weight 73.936 kg (163 lb), SpO2 97.00%, unknown if currently breastfeeding.  Physical Exam:  General: alert and cooperative Lochia: appropriate Uterine Fundus: firm Episiotomy, laceration : na DVT Evaluation: No evidence of DVT seen on physical exam.   Basename 03/02/12 0540 03/01/12 0740  HGB 9.6* 10.6*  HCT 30.0* 33.9*    Assessment/Plan: Discharge home   LOS: 2 days   Jacquetta Polhamus E 03/03/2012, 10:39 AM

## 2012-03-11 NOTE — H&P (Signed)
Delbert Harness, MD Physician Signed  H&P 02/26/2012 2:09 PM  Related encounter: Orders Only from 02/26/2012 in [facility]   Carrie Bean is a 26 y.o. female presenting for elective induction of labor after successfully taking the pregnancy to term after first pregnancy delivered at 36 weeks and the subsequent pregnancy delivered at 40 weeks and this pregnancy she has received the 17 hydroxyprogesterone injections weekly until 36 weeks. She now would like to be delivered at her earliest possible convenience. The increased risk of inducing delivery with regard to the increased operative delivery including cesarean delivery. Wishes to go ahead and proceed with induction of labor. I did tell her that I thought there: Exam which was was last checked on 02/16/2012 noted to be 4/80/-2/vertex/soft/anterior 40 Bishop score of 10. She wishes to proceed all questions were answered.    OB History     Grav  Para  Term  Preterm  Abortions  TAB  SAB  Ect  Mult  Living     3  2  1  1       2           Past Medical History    Diagnosis  Date    .  Preterm labor     .  Urinary tract infection     .  Gonorrhea     .  Chlamydia     .  Abnormal Pap smear       f/u was normal       Past Surgical History    Procedure  Date    .  No past surgeries      Family History: family history is negative for Other and Hearing loss.  Social History: reports that she has never smoked. She has never used smokeless tobacco. She reports that she does not drink alcohol or use illicit drugs.  Review of Systems  Constitutional: Negative. Negative for fever and chills.  HENT: Negative for hearing loss and tinnitus.  Mild HA  Eyes: Negative for blurred vision, double vision and photophobia.  Respiratory: Negative. Negative for cough, hemoptysis, sputum production, shortness of breath and wheezing.  Cardiovascular: Negative. Negative for chest pain and palpitations.  Gastrointestinal: Positive for nausea. Negative for  heartburn, vomiting, abdominal pain, diarrhea, constipation, blood in stool and melena.  Genitourinary: Positive for frequency. Negative for dysuria, urgency, hematuria and flank pain.  Musculoskeletal: Negative. Negative for myalgias and joint pain.  Skin: Negative. Negative for rash.  Neurological: Positive for headaches. Negative for dizziness, tingling and seizures.  Endo/Heme/Allergies: Negative. Does not bruise/bleed easily.  Psychiatric/Behavioral: Negative. Negative for depression and suicidal ideas.   General Appearance: alert, well appearing, in no apparent distress, oriented to person, place and time  Thyroid: no thyromegaly or masses present  Mouth: mucous membranes moist, pharynx normal without lesions  Breasts: no masses noted, no significant tenderness, no palpable axillary nodes, no skin changes  Lungs: clear to auscultation, no wheezes, rales or rhonchi, symmetric air entry  Heart: regular rate and rhythm, no murmurs  Abdomen: FHT present  Pelvic exam as above  Back exam: no CVA tenderness  Extremities: no redness or tenderness in the calves or thighs, no edema  Skin: normal coloration and turgor, no rashes   There were no vitals taken for this visit.  Prenatal labs:  ABO, Rh: B/Positive/-- (03/19 0000)  Antibody: Negative (03/19 0000)  Rubella:  RPR: Nonreactive (03/19 0000)  HBsAg: Negative (03/19 0000)  HIV: Non-reactive (03/19 0000)  GBS:  Assessment/Plan:  IUP at 39 weeks and 0 days on 03/01/2012 after an EDC of 03/08/2012  History preterm labor  Plan:  Elective induction of labor with Pitocin.  Levante Simones H.  02/26/2012, 2:09 PM

## 2013-04-13 ENCOUNTER — Encounter: Payer: Self-pay | Admitting: Obstetrics & Gynecology

## 2013-04-13 ENCOUNTER — Ambulatory Visit (INDEPENDENT_AMBULATORY_CARE_PROVIDER_SITE_OTHER): Payer: Medicaid Other | Admitting: Obstetrics & Gynecology

## 2013-04-13 VITALS — BP 110/70 | Temp 97.3°F | Wt 151.0 lb

## 2013-04-13 DIAGNOSIS — O0992 Supervision of high risk pregnancy, unspecified, second trimester: Secondary | ICD-10-CM

## 2013-04-13 DIAGNOSIS — O09219 Supervision of pregnancy with history of pre-term labor, unspecified trimester: Secondary | ICD-10-CM

## 2013-04-13 DIAGNOSIS — O0990 Supervision of high risk pregnancy, unspecified, unspecified trimester: Secondary | ICD-10-CM | POA: Insufficient documentation

## 2013-04-13 LAB — POCT URINALYSIS DIP (DEVICE)
Glucose, UA: NEGATIVE mg/dL
Hgb urine dipstick: NEGATIVE
Protein, ur: 30 mg/dL — AB
pH: 6.5 (ref 5.0–8.0)

## 2013-04-13 NOTE — Progress Notes (Signed)
P= 90 Pt. Reports she is 26 weeks.  Pt. States she has had all of her lab work as well as a pap and GC/chlamydia done.  Pt. States she has been to the hospital twice for this pregnancy; last time was two days ago for contractions. Pt. Stated she was not dialated but MD could not tell if she was thinning out or not, pt. States it was too painful for her to continue with exam. Pt. States she has not felt contractions since that visit but she does c/o of intermittent lower abdominal cramping.  Was treated for BV during this pregnancy but it has since cleared up.

## 2013-04-13 NOTE — Progress Notes (Signed)
Transfer from [facility] at recommendation [facility], Shanda Bumps. Too late to start 17 P, her h/o PTB is at 36.6 weeks, normal size baby. Will have f/u US, RTC 2 weeks.

## 2013-04-13 NOTE — Progress Notes (Signed)
Nutrition note: 1st visit consult Pt has gained 16# @ [redacted]w[redacted]d (per pt, due 07/22/13), which is wnl. Pt reports eating 3-4 meals & 4-5 snacks/d.  Pt is taking PNV. Pt reports no N/V or heartburn. Pt received verbal & written education on general nutrition during pregnancy. Discussed wt gain goals of 25-35# or 1#/wk. Pt agrees to continue taking PNV. Pt does not have WIC but plans to apply. Pt plans to BF. F/u if referred Blondell Reveal, MS, RD, LDN

## 2013-04-13 NOTE — Progress Notes (Signed)
U/S scheduled 04/17/13 at 930 am.

## 2013-04-13 NOTE — Patient Instructions (Signed)
Preterm Labor Information Preterm labor is when labor starts at less than 37 weeks of pregnancy. The normal length of a pregnancy is 39 to 41 weeks. CAUSES Often, there is no identifiable underlying cause as to why a woman goes into preterm labor. One of the most common known causes of preterm labor is infection. Infections of the uterus, cervix, vagina, amniotic sac, bladder, kidney, or even the lungs (pneumonia) can cause labor to start. Other suspected causes of preterm labor include:   Urogenital infections, such as yeast infections and bacterial vaginosis.   Uterine abnormalities (uterine shape, uterine septum, fibroids, or bleeding from the placenta).   A cervix that has been operated on (it may fail to stay closed).   Malformations in the fetus.   Multiple gestations (twins, triplets, and so on).   Breakage of the amniotic sac.  RISK FACTORS  Having a previous history of preterm labor.   Having premature rupture of membranes (PROM).   Having a placenta that covers the opening of the cervix (placenta previa).   Having a placenta that separates from the uterus (placental abruption).   Having a cervix that is too weak to hold the fetus in the uterus (incompetent cervix).   Having too much fluid in the amniotic sac (polyhydramnios).   Taking illegal drugs or smoking while pregnant.   Not gaining enough weight while pregnant.   Being younger than 18 and older than 25 years old.   Having a low socioeconomic status.   Being African American. SYMPTOMS Signs and symptoms of preterm labor include:   Menstrual-like cramps, abdominal pain, or back pain.  Uterine contractions that are regular, as frequent as six in an hour, regardless of their intensity (may be mild or painful).  Contractions that start on the top of the uterus and spread down to the lower abdomen and back.   A sense of increased pelvic pressure.   A watery or bloody mucus discharge that  comes from the vagina.  TREATMENT Depending on the length of the pregnancy and other circumstances, your health care provider may suggest bed rest. If necessary, there are medicines that can be given to stop contractions and to mature the fetal lungs. If labor happens before 34 weeks of pregnancy, a prolonged hospital stay may be recommended. Treatment depends on the condition of both you and the fetus.  WHAT SHOULD YOU DO IF YOU THINK YOU ARE IN PRETERM LABOR? Call your health care provider right away. You will need to go to the hospital to get checked immediately. HOW CAN YOU PREVENT PRETERM LABOR IN FUTURE PREGNANCIES? You should:   Stop smoking if you smoke.  Maintain healthy weight gain and avoid chemicals and drugs that are not necessary.  Be watchful for any type of infection.  Inform your health care provider if you have a known history of preterm labor. Document Released: 08/15/2003 Document Revised: 01/25/2013 Document Reviewed: 06/27/2012 ExitCare Patient Information 2014 [employer].    

## 2013-04-15 LAB — PRESCRIPTION MONITORING PROFILE (19 PANEL)
Amphetamine/Meth: NEGATIVE ng/mL
Barbiturate Screen, Urine: NEGATIVE ng/mL
Benzodiazepine Screen, Urine: NEGATIVE ng/mL
Buprenorphine, Urine: NEGATIVE ng/mL
Cannabinoid Scrn, Ur: NEGATIVE ng/mL
Carisoprodol, Urine: NEGATIVE ng/mL
Methadone Screen, Urine: NEGATIVE ng/mL
Methaqualone: NEGATIVE ng/mL
Nitrites, Initial: NEGATIVE ug/mL
Opiate Screen, Urine: NEGATIVE ng/mL
Phencyclidine, Ur: NEGATIVE ng/mL
Propoxyphene: NEGATIVE ng/mL
Tapentadol, urine: NEGATIVE ng/mL

## 2013-04-17 ENCOUNTER — Ambulatory Visit (HOSPITAL_COMMUNITY)
Admission: RE | Admit: 2013-04-17 | Discharge: 2013-04-17 | Disposition: A | Discharge status: Home / Self Care | Payer: Medicaid Other | Source: Ambulatory Visit | Attending: Obstetrics & Gynecology | Admitting: Obstetrics & Gynecology

## 2013-04-17 ENCOUNTER — Ambulatory Visit (HOSPITAL_COMMUNITY): Admission: RE | Admit: 2013-04-17 | Payer: Medicaid Other | Source: Ambulatory Visit

## 2013-04-17 DIAGNOSIS — O0992 Supervision of high risk pregnancy, unspecified, second trimester: Secondary | ICD-10-CM

## 2013-04-17 DIAGNOSIS — Z8751 Personal history of pre-term labor: Secondary | ICD-10-CM | POA: Insufficient documentation

## 2013-04-17 DIAGNOSIS — Z3689 Encounter for other specified antenatal screening: Secondary | ICD-10-CM | POA: Insufficient documentation

## 2013-04-21 ENCOUNTER — Encounter: Payer: Self-pay | Admitting: *Deleted

## 2013-04-21 DIAGNOSIS — O09219 Supervision of pregnancy with history of pre-term labor, unspecified trimester: Secondary | ICD-10-CM | POA: Insufficient documentation

## 2013-04-27 ENCOUNTER — Ambulatory Visit (INDEPENDENT_AMBULATORY_CARE_PROVIDER_SITE_OTHER): Payer: Medicaid Other | Admitting: Family

## 2013-04-27 ENCOUNTER — Encounter: Payer: Self-pay | Admitting: Family

## 2013-04-27 VITALS — BP 121/68 | Temp 98.4°F

## 2013-04-27 DIAGNOSIS — Z23 Encounter for immunization: Secondary | ICD-10-CM

## 2013-04-27 DIAGNOSIS — O09219 Supervision of pregnancy with history of pre-term labor, unspecified trimester: Secondary | ICD-10-CM

## 2013-04-27 DIAGNOSIS — O0992 Supervision of high risk pregnancy, unspecified, second trimester: Secondary | ICD-10-CM

## 2013-04-27 MED ORDER — TETANUS-DIPHTH-ACELL PERTUSSIS 5-2.5-18.5 LF-MCG/0.5 IM SUSP: 0.5000 mL | Freq: Once | INTRAMUSCULAR | Status: DC

## 2013-04-27 NOTE — Progress Notes (Signed)
P=108, c/ o less contractions than she was having. States still having everyday, more at night,.

## 2013-04-27 NOTE — Progress Notes (Signed)
No questions or concerns.  Less contractions, no bleeding or leaking of fluid. Obtain OB panel, 1 hr test today.  tdap today.  Urine results not available at discharge.

## 2013-04-28 LAB — OBSTETRIC PANEL
Hemoglobin: 10.7 g/dL — ABNORMAL LOW (ref 12.0–15.0)
Hepatitis B Surface Ag: NEGATIVE
Lymphocytes Relative: 15 % (ref 12–46)
Lymphs Abs: 0.9 10*3/uL (ref 0.7–4.0)
MCH: 26 pg (ref 26.0–34.0)
MCV: 77.6 fL — ABNORMAL LOW (ref 78.0–100.0)
Monocytes Relative: 9 % (ref 3–12)
Neutrophils Relative %: 71 % (ref 43–77)
Platelets: 229 10*3/uL (ref 150–400)
RBC: 4.11 MIL/uL (ref 3.87–5.11)
Rh Type: POSITIVE
Rubella: 2.32 Index — ABNORMAL HIGH (ref <0.90)
WBC: 6.4 10*3/uL (ref 4.0–10.5)

## 2013-04-28 LAB — GLUCOSE TOLERANCE, 1 HOUR (50G) W/O FASTING: Glucose, 1 Hour GTT: 121 mg/dL (ref 70–140)

## 2013-04-29 ENCOUNTER — Encounter: Payer: Self-pay | Admitting: Family

## 2013-05-01 ENCOUNTER — Encounter: Payer: Self-pay | Admitting: *Deleted

## 2013-05-01 LAB — HEMOGLOBINOPATHY EVALUATION
Hemoglobin Other: 0 %
Hgb S Quant: 0 %

## 2013-05-02 ENCOUNTER — Telehealth: Payer: Self-pay | Admitting: *Deleted

## 2013-05-02 NOTE — Telephone Encounter (Signed)
05/02/2013:  Pt called nurse line, did not leave a message.  05/02/2013: Attempted to call, message left to call office.

## 2013-05-03 ENCOUNTER — Encounter: Payer: Medicaid Other | Admitting: Advanced Practice Midwife

## 2013-05-03 NOTE — Telephone Encounter (Signed)
Called [facility] and she c/o pains in her lower stomach since Friday, states they are not contractions. States she went to [facility] and they told her to call us since she goes here.Per chart has a history of preterm labor.   Informed her she needs to be evaluated and she agreed to an appointment today at 10:15.

## 2013-05-11 ENCOUNTER — Ambulatory Visit (INDEPENDENT_AMBULATORY_CARE_PROVIDER_SITE_OTHER): Payer: Medicaid Other | Admitting: Family

## 2013-05-11 VITALS — BP 112/72 | Temp 97.4°F | Wt 160.3 lb

## 2013-05-11 DIAGNOSIS — O09219 Supervision of pregnancy with history of pre-term labor, unspecified trimester: Secondary | ICD-10-CM

## 2013-05-11 DIAGNOSIS — O0992 Supervision of high risk pregnancy, unspecified, second trimester: Secondary | ICD-10-CM

## 2013-05-11 NOTE — Progress Notes (Signed)
Pulse- 98 Patient reports pelvic & back pain as well as pelvic pressure; reports contractions happening on and off

## 2013-05-11 NOTE — Progress Notes (Signed)
Reports contractions that increased starting 5 days ago; approximately 3-4/hour.  Went to hospital in [facility] and was told she couldn't get anything for the pain and pressure.  No vaginal bleeding.  Reports thin white discharge x 3 days.  No sexual intercourse in past 48 hours.  Pelvic - neg pooling, neg ferning.  Copious, foamy discharge > wet prep sent to lab.  Preterm labor precautions given.

## 2013-05-12 ENCOUNTER — Other Ambulatory Visit: Payer: Self-pay | Admitting: Family

## 2013-05-12 LAB — WET PREP, GENITAL
Trich, Wet Prep: NONE SEEN
Yeast Wet Prep HPF POC: NONE SEEN

## 2013-05-12 MED ORDER — METRONIDAZOLE 500 MG PO TABS: 500.0000 mg | ORAL_TABLET | Freq: Two times a day (BID) | ORAL | Status: DC

## 2013-05-12 NOTE — Progress Notes (Signed)
Pt notified regarding bacterial vaginosis and RX sent to lab.

## 2013-05-25 ENCOUNTER — Ambulatory Visit (INDEPENDENT_AMBULATORY_CARE_PROVIDER_SITE_OTHER): Payer: Medicaid Other | Admitting: Obstetrics & Gynecology

## 2013-05-25 VITALS — BP 108/71 | Temp 97.0°F | Wt 158.4 lb

## 2013-05-25 DIAGNOSIS — O09219 Supervision of pregnancy with history of pre-term labor, unspecified trimester: Secondary | ICD-10-CM

## 2013-05-25 DIAGNOSIS — O0993 Supervision of high risk pregnancy, unspecified, third trimester: Secondary | ICD-10-CM

## 2013-05-25 NOTE — Progress Notes (Signed)
P - 97 

## 2013-05-25 NOTE — Progress Notes (Signed)
Occasional contractions, no leaking or bleeding.

## 2013-06-08 NOTE — L&D Delivery Note (Signed)
Delivery Note At 1:23 PM a viable female was delivered via Vaginal, Spontaneous Delivery (Presentation:right; Occiput Anterior).  APGAR: 9, 9; weight Pending.   Placenta status: Intact, Spontaneous.  Cord: 3 vessels with the following complications: None.  Cord pH: NA  Anesthesia: Epidural  Episiotomy: None Lacerations: None Suture Repair: NA Est. Blood Loss (mL): 300  Mom to postpartum.  Baby to Couplet care / Skin to Skin. Placenta to: Birthing Suites Feeding: Breast Circ: OP Contraception: Carrie ProwsMirena  Carrie Bean 07/16/2013, 2:14 PM

## 2013-06-15 ENCOUNTER — Ambulatory Visit (INDEPENDENT_AMBULATORY_CARE_PROVIDER_SITE_OTHER): Payer: Medicaid Other | Admitting: Advanced Practice Midwife

## 2013-06-15 ENCOUNTER — Encounter: Payer: Self-pay | Admitting: Advanced Practice Midwife

## 2013-06-15 VITALS — BP 122/74 | Temp 98.6°F | Wt 160.7 lb

## 2013-06-15 DIAGNOSIS — O09219 Supervision of pregnancy with history of pre-term labor, unspecified trimester: Secondary | ICD-10-CM

## 2013-06-15 DIAGNOSIS — O09213 Supervision of pregnancy with history of pre-term labor, third trimester: Secondary | ICD-10-CM

## 2013-06-15 NOTE — Patient Instructions (Signed)
Preterm Labor Information Preterm labor is when labor starts at less than 37 weeks of pregnancy. The normal length of a pregnancy is 39 to 41 weeks. CAUSES Often, there is no identifiable underlying cause as to why a woman goes into preterm labor. One of the most common known causes of preterm labor is infection. Infections of the uterus, cervix, vagina, amniotic sac, bladder, kidney, or even the lungs (pneumonia) can cause labor to start. Other suspected causes of preterm labor include:   Urogenital infections, such as yeast infections and bacterial vaginosis.   Uterine abnormalities (uterine shape, uterine septum, fibroids, or bleeding from the placenta).   A cervix that has been operated on (it may fail to stay closed).   Malformations in the fetus.   Multiple gestations (twins, triplets, and so on).   Breakage of the amniotic sac.  RISK FACTORS  Having a previous history of preterm labor.   Having premature rupture of membranes (PROM).   Having a placenta that covers the opening of the cervix (placenta previa).   Having a placenta that separates from the uterus (placental abruption).   Having a cervix that is too weak to hold the fetus in the uterus (incompetent cervix).   Having too much fluid in the amniotic sac (polyhydramnios).   Taking illegal drugs or smoking while pregnant.   Not gaining enough weight while pregnant.   Being younger than 18 and older than 26 years old.   Having a low socioeconomic status.   Being African American. SYMPTOMS Signs and symptoms of preterm labor include:   Menstrual-like cramps, abdominal pain, or back pain.  Uterine contractions that are regular, as frequent as six in an hour, regardless of their intensity (may be mild or painful).  Contractions that start on the top of the uterus and spread down to the lower abdomen and back.   A sense of increased pelvic pressure.   A watery or bloody mucus discharge that  comes from the vagina.  TREATMENT Depending on the length of the pregnancy and other circumstances, your health care provider may suggest bed rest. If necessary, there are medicines that can be given to stop contractions and to mature the fetal lungs. If labor happens before 34 weeks of pregnancy, a prolonged hospital stay may be recommended. Treatment depends on the condition of both you and the fetus.  WHAT SHOULD YOU DO IF YOU THINK YOU ARE IN PRETERM LABOR? Call your health care provider right away. You will need to go to the hospital to get checked immediately. HOW CAN YOU PREVENT PRETERM LABOR IN FUTURE PREGNANCIES? You should:   Stop smoking if you smoke.  Maintain healthy weight gain and avoid chemicals and drugs that are not necessary.  Be watchful for any type of infection.  Inform your health care provider if you have a known history of preterm labor. Document Released: 08/15/2003 Document Revised: 01/25/2013 Document Reviewed: 06/27/2012 ExitCare Patient Information 2014 [employer].    

## 2013-06-15 NOTE — Progress Notes (Signed)
[facility].  Discussed PTL. Did not have many contractions. Treated with IVF. Cervix stable today, but dilated.  PTL precautions. Too far for B-Methasone.

## 2013-06-15 NOTE — Progress Notes (Signed)
Pulse- 100 Patient states she went to [facility] for vaginal pressure, was treated for dehydration & a bladder infection and was told her cervix was thinning and she had a bulging bag of water; reports a lot of vaginal & pelvic pressure today

## 2013-06-21 ENCOUNTER — Telehealth: Payer: Self-pay | Admitting: *Deleted

## 2013-06-21 NOTE — Telephone Encounter (Signed)
Patient reports that she is experiencing lots of pressure and back pain. She feels a gush when she changes positions. Her underwear are soaked with fluid. I advised that she go to MAU to be evaluated. Patient agreed.

## 2013-06-23 ENCOUNTER — Inpatient Hospital Stay (HOSPITAL_COMMUNITY)
Admission: AD | Admit: 2013-06-23 | Discharge: 2013-06-23 | Disposition: A | Discharge status: Home / Self Care | Payer: Medicaid Other | Source: Ambulatory Visit | Attending: Obstetrics & Gynecology | Admitting: Obstetrics & Gynecology

## 2013-06-23 ENCOUNTER — Encounter (HOSPITAL_COMMUNITY): Payer: Self-pay | Admitting: General Practice

## 2013-06-23 DIAGNOSIS — M549 Dorsalgia, unspecified: Secondary | ICD-10-CM | POA: Insufficient documentation

## 2013-06-23 DIAGNOSIS — O4700 False labor before 37 completed weeks of gestation, unspecified trimester: Secondary | ICD-10-CM | POA: Insufficient documentation

## 2013-06-23 DIAGNOSIS — N949 Unspecified condition associated with female genital organs and menstrual cycle: Secondary | ICD-10-CM | POA: Insufficient documentation

## 2013-06-23 DIAGNOSIS — O479 False labor, unspecified: Secondary | ICD-10-CM

## 2013-06-23 DIAGNOSIS — E860 Dehydration: Secondary | ICD-10-CM | POA: Insufficient documentation

## 2013-06-23 LAB — URINALYSIS, ROUTINE W REFLEX MICROSCOPIC
Bilirubin Urine: NEGATIVE
Glucose, UA: NEGATIVE mg/dL
Hgb urine dipstick: NEGATIVE
Ketones, ur: 15 mg/dL — AB
LEUKOCYTES UA: NEGATIVE
NITRITE: NEGATIVE
PH: 6 (ref 5.0–8.0)
Protein, ur: NEGATIVE mg/dL
Urobilinogen, UA: 4 mg/dL — ABNORMAL HIGH (ref 0.0–1.0)

## 2013-06-23 NOTE — Discharge Instructions (Signed)

## 2013-06-23 NOTE — MAU Provider Note (Signed)
History     CSN: [id number]  Arrival date and time: 06/23/13 [id number]   First Provider Initiated Contact with Patient 06/23/13 2104      Chief Complaint  Patient presents with  . Pelvic Pain  . Back Pain   HPI This is a 28 y.o. female at 8929w0d who presents with c/o pelvic pressure. Has some irregular contractions which precipitate this pressure. No bleeding. Baby moving normally  RN Note: Last night my vagina hurt so bad due to a lot of pressure. Also pressure in lower back. Pressure continues today. Having watery d/c        OB History   Grav Para Term Preterm Abortions TAB SAB Ect Mult Living   4 3 2 1  0 0 0 0 0 3      Past Medical History  Diagnosis Date  . Preterm labor   . Urinary tract infection   . Gonorrhea   . Chlamydia   . Abnormal Pap smear     f/u was normal    Past Surgical History  Procedure Laterality Date  . No past surgeries      Family History  Problem Relation Age of Onset  . Other Neg Hx   . Hearing loss Neg Hx     History  Substance Use Topics  . Smoking status: Never Smoker   . Smokeless tobacco: Never Used  . Alcohol Use: No    Allergies: No Known Allergies  Prescriptions prior to admission  Medication Sig Dispense Refill  . Prenatal Vit-Fe Fumarate-FA (PRENATAL MULTIVITAMIN) TABS Take 1 tablet by mouth daily.        Review of Systems  Constitutional: Negative for fever, chills and malaise/fatigue.  Gastrointestinal: Negative for nausea, vomiting, abdominal pain (but does have pressure with contractions), diarrhea and constipation.  Genitourinary: Negative for dysuria.   Physical Exam   Blood pressure 116/70, pulse 102, temperature 97.8 F (36.6 C), resp. rate 20, height 5\' 10"  (1.778 m), weight 75.206 kg (165 lb 12.8 oz).  Physical Exam  Constitutional: She is oriented to person, place, and time. She appears well-developed and well-nourished. No distress.  Cardiovascular: Normal rate.   Respiratory: Effort normal.  GI:  Soft.  Genitourinary: Uterus normal. Vaginal discharge (thick mucous, no watery discharge) found.  Dilation: 1.5 Effacement (%): 40 Station: Ballotable Exam by:: Wynelle BourgeoisMarie Cardarius Senat CNM  Was 1cm last week by my exam  Musculoskeletal: Normal range of motion.  Neurological: She is alert and oriented to person, place, and time.  Skin: Skin is warm and dry.  Psychiatric: She has a normal mood and affect.   FHR reactive with no decels Category I Some uterine irritability, cramps only last 23-30 seconds each  MAU Course  Procedures  MDM Results for orders placed during the hospital encounter of 06/23/13 (from the past 24 hour(s))  URINALYSIS, ROUTINE W REFLEX MICROSCOPIC     Status: Abnormal   Collection Time    06/23/13  8:06 PM      Result Value Range   Color, Urine YELLOW  YELLOW   APPearance CLEAR  CLEAR   Specific Gravity, Urine >1.030 (*) 1.005 - 1.030   pH 6.0  5.0 - 8.0   Glucose, UA NEGATIVE  NEGATIVE mg/dL   Hgb urine dipstick NEGATIVE  NEGATIVE   Bilirubin Urine NEGATIVE  NEGATIVE   Ketones, ur 15 (*) NEGATIVE mg/dL   Protein, ur NEGATIVE  NEGATIVE mg/dL   Urobilinogen, UA 4.0 (*) 0.0 - 1.0 mg/dL   Nitrite NEGATIVE  NEGATIVE   Leukocytes, UA NEGATIVE  NEGATIVE     Assessment and Plan  A:  SIUP at [redacted]w[redacted]d       Uterine irritability, not in labor      Mild dehydration  P:  Discharge home       Encouraged to drink more liberally, not just juice (drinks 3 small water bottles per 24 hrs)      Followup in clinic      Labor precautions.    [facility] 06/23/2013, 9:28 PM

## 2013-06-23 NOTE — MAU Note (Signed)
Pt presents to MAu with c/o sharp pressure in vagina and back since last night around 2000.

## 2013-06-23 NOTE — Progress Notes (Signed)
Carrie Bean CNM in to check pt and discuss d/c plan. Written and verbal d/c instructions given and understanding voiced

## 2013-06-23 NOTE — MAU Note (Signed)
Last night my vagina hurt so bad due to a lot of pressure. Also pressure in lower back. Pressure continues today. Having watery d/c

## 2013-06-28 ENCOUNTER — Ambulatory Visit (INDEPENDENT_AMBULATORY_CARE_PROVIDER_SITE_OTHER): Payer: Medicaid Other | Admitting: Advanced Practice Midwife

## 2013-06-28 VITALS — BP 117/73 | Temp 98.7°F | Wt 163.6 lb

## 2013-06-28 DIAGNOSIS — O09899 Supervision of other high risk pregnancies, unspecified trimester: Secondary | ICD-10-CM

## 2013-06-28 DIAGNOSIS — O09219 Supervision of pregnancy with history of pre-term labor, unspecified trimester: Secondary | ICD-10-CM

## 2013-06-28 DIAGNOSIS — O9989 Other specified diseases and conditions complicating pregnancy, childbirth and the puerperium: Secondary | ICD-10-CM

## 2013-06-28 DIAGNOSIS — O26899 Other specified pregnancy related conditions, unspecified trimester: Principal | ICD-10-CM

## 2013-06-28 DIAGNOSIS — N898 Other specified noninflammatory disorders of vagina: Secondary | ICD-10-CM

## 2013-06-28 LAB — POCT URINALYSIS DIP (DEVICE)
Glucose, UA: NEGATIVE mg/dL
Hgb urine dipstick: NEGATIVE
Leukocytes, UA: NEGATIVE
Nitrite: NEGATIVE
PH: 6.5 (ref 5.0–8.0)
PROTEIN: 30 mg/dL — AB
Urobilinogen, UA: 1 mg/dL (ref 0.0–1.0)

## 2013-06-28 LAB — OB RESULTS CONSOLE GC/CHLAMYDIA
Chlamydia: NEGATIVE
Gonorrhea: NEGATIVE

## 2013-06-28 LAB — OB RESULTS CONSOLE GBS: STREP GROUP B AG: NEGATIVE

## 2013-06-28 MED ORDER — TERCONAZOLE 0.4 % VA CREA: 1.0000 | TOPICAL_CREAM | Freq: Every day | VAGINAL | Status: DC

## 2013-06-28 NOTE — Progress Notes (Signed)
Doing well.  Good fetal movement, denies vaginal bleeding, LOF, regular contractions.  Reports irregular painful braxton-hicks contractions and vaginal discharge with itching.  White thick vaginal discharge on exam.  Terazol 7 sent to pharmacy.  Wet prep collected.  GBS/GCC collected. Reviewed signs of labor/reasons to come to hospital. Needs to leave urine sample after visit.

## 2013-06-28 NOTE — Progress Notes (Signed)
Pulse-  106 Pt reports itching discharge;

## 2013-06-29 LAB — WET PREP, GENITAL
Trich, Wet Prep: NONE SEEN
YEAST WET PREP: NONE SEEN

## 2013-06-29 LAB — GC/CHLAMYDIA PROBE AMP
CT Probe RNA: NEGATIVE
GC PROBE AMP APTIMA: NEGATIVE

## 2013-06-30 LAB — CULTURE, BETA STREP (GROUP B ONLY)

## 2013-07-01 ENCOUNTER — Inpatient Hospital Stay (HOSPITAL_COMMUNITY)
Admission: AD | Admit: 2013-07-01 | Discharge: 2013-07-01 | Disposition: A | Discharge status: Home / Self Care | Payer: Medicaid Other | Source: Ambulatory Visit | Attending: Obstetrics & Gynecology | Admitting: Obstetrics & Gynecology

## 2013-07-01 ENCOUNTER — Encounter (HOSPITAL_COMMUNITY): Payer: Self-pay | Admitting: Family

## 2013-07-01 DIAGNOSIS — O479 False labor, unspecified: Secondary | ICD-10-CM | POA: Insufficient documentation

## 2013-07-01 DIAGNOSIS — O09219 Supervision of pregnancy with history of pre-term labor, unspecified trimester: Secondary | ICD-10-CM

## 2013-07-01 LAB — POCT FERN TEST: POCT FERN TEST: NEGATIVE

## 2013-07-01 NOTE — MAU Note (Signed)
28 yo, G4P3 at 7940w0d, presents with c/o contractions every 10 minutes since this morning. Denies VB, HSV. Is unsure if water is broken; felt sensation as if water had broken last night. No LOF since that time. Reports +FM.

## 2013-07-01 NOTE — MAU Provider Note (Signed)
Speculum Exam for possible ROM No pooling seen, but clear thick mucus seen coming from cervical os.  Small amount of yellow discharge.  Fern slide made and to be read by RN.   Assessment: no obvious signs of ROM.

## 2013-07-04 ENCOUNTER — Telehealth: Payer: Self-pay | Admitting: *Deleted

## 2013-07-04 NOTE — Telephone Encounter (Signed)
Patient called this morning complaining of having a lot of pressure and some contractions. She denies vaginal bleeding or leakage of fluids. She says she isn't sure how much the baby is moving. I recommended that she go lay down for a while and count the movements that she feels. If she doesn't count 10 kicks in that hour she needs to go to MAU. Patient agrees. Also advised that if her contractions become regular or she develops bleeding or leakage of fluid to come directly to MAU.

## 2013-07-05 ENCOUNTER — Ambulatory Visit (INDEPENDENT_AMBULATORY_CARE_PROVIDER_SITE_OTHER): Payer: Medicaid Other | Admitting: Advanced Practice Midwife

## 2013-07-05 ENCOUNTER — Telehealth: Payer: Self-pay | Admitting: General Practice

## 2013-07-05 VITALS — BP 121/77 | Wt 164.3 lb

## 2013-07-05 DIAGNOSIS — M7918 Myalgia, other site: Secondary | ICD-10-CM

## 2013-07-05 DIAGNOSIS — IMO0001 Reserved for inherently not codable concepts without codable children: Secondary | ICD-10-CM

## 2013-07-05 DIAGNOSIS — O09219 Supervision of pregnancy with history of pre-term labor, unspecified trimester: Secondary | ICD-10-CM

## 2013-07-05 DIAGNOSIS — O0990 Supervision of high risk pregnancy, unspecified, unspecified trimester: Secondary | ICD-10-CM

## 2013-07-05 LAB — POCT URINALYSIS DIP (DEVICE)
BILIRUBIN URINE: NEGATIVE
Glucose, UA: NEGATIVE mg/dL
Hgb urine dipstick: NEGATIVE
KETONES UR: NEGATIVE mg/dL
Nitrite: NEGATIVE
PH: 6.5 (ref 5.0–8.0)
PROTEIN: NEGATIVE mg/dL
SPECIFIC GRAVITY, URINE: 1.02 (ref 1.005–1.030)
Urobilinogen, UA: 2 mg/dL — ABNORMAL HIGH (ref 0.0–1.0)

## 2013-07-05 MED ORDER — CYCLOBENZAPRINE HCL 10 MG PO TABS: 5.0000 mg | ORAL_TABLET | Freq: Three times a day (TID) | ORAL | Status: DC | PRN

## 2013-07-05 NOTE — Progress Notes (Signed)
Pulse-  95 

## 2013-07-05 NOTE — Patient Instructions (Signed)
Braxton Hicks Contractions Pregnancy is commonly associated with contractions of the uterus throughout the pregnancy. Towards the end of pregnancy (32 to 34 weeks), these contractions (Braxton Hicks) can develop more often and may become more forceful. This is not true labor because these contractions do not result in opening (dilatation) and thinning of the cervix. They are sometimes difficult to tell apart from true labor because these contractions can be forceful and people have different pain tolerances. You should not feel embarrassed if you go to the hospital with false labor. Sometimes, the only way to tell if you are in true labor is for your caregiver to follow the changes in the cervix. How to tell the difference between true and false labor:  False labor.  The contractions of false labor are usually shorter, irregular and not as hard as those of true labor.  They are often felt in the front of the lower abdomen and in the groin.  They may leave with walking around or changing positions while lying down.  They get weaker and are shorter lasting as time goes on.  These contractions are usually irregular.  They do not usually become progressively stronger, regular and closer together as with true labor.  True labor.  Contractions in true labor last 30 to 70 seconds, become very regular, usually become more intense, and increase in frequency.  They do not go away with walking.  The discomfort is usually felt in the top of the uterus and spreads to the lower abdomen and low back.  True labor can be determined by your caregiver with an exam. This will show that the cervix is dilating and getting thinner. If there are no prenatal problems or other health problems associated with the pregnancy, it is completely safe to be sent home with false labor and await the onset of true labor. HOME CARE INSTRUCTIONS   Keep up with your usual exercises and instructions.  Take medications as  directed.  Keep your regular prenatal appointment.  Eat and drink lightly if you think you are going into labor.  If BH contractions are making you uncomfortable:  Change your activity position from lying down or resting to walking/walking to resting.  Sit and rest in a tub of warm water.  Drink 2 to 3 glasses of water. Dehydration may cause B-H contractions.  Do slow and deep breathing several times an hour. SEEK IMMEDIATE MEDICAL CARE IF:   Your contractions continue to become stronger, more regular, and closer together.  You have a gushing, burst or leaking of fluid from the vagina.  An oral temperature above 102 F (38.9 C) develops.  You have passage of blood-tinged mucus.  You develop vaginal bleeding.  You develop continuous belly (abdominal) pain.  You have low back pain that you never had before.  You feel the baby's head pushing down causing pelvic pressure.  The baby is not moving as much as it used to. Document Released: 05/25/2005 Document Revised: 08/17/2011 Document Reviewed: 03/06/2013 [employer] Patient Information 2014 [employer].  Fetal Movement Counts Patient Name: __________________________________________________ Patient Due Date: ____________________ Performing a fetal movement count is highly recommended in high-risk pregnancies, but it is good for every pregnant woman to do. Your caregiver may ask you to start counting fetal movements at 28 weeks of the pregnancy. Fetal movements often increase:  After eating a full meal.  After physical activity.  After eating or drinking something sweet or cold.  At rest. Pay attention to when you feel   the baby is most active. This will help you notice a pattern of your baby's sleep and wake cycles and what factors contribute to an increase in fetal movement. It is important to perform a fetal movement count at the same time each day when your baby is normally most active.  HOW TO COUNT FETAL  MOVEMENTS 1. Find a quiet and comfortable area to sit or lie down on your left side. Lying on your left side provides the best blood and oxygen circulation to your baby. 2. Write down the day and time on a sheet of paper or in a journal. 3. Start counting kicks, flutters, swishes, rolls, or jabs in a 2 hour period. You should feel at least 10 movements within 2 hours. 4. If you do not feel 10 movements in 2 hours, wait 2 3 hours and count again. Look for a change in the pattern or not enough counts in 2 hours. SEEK MEDICAL CARE IF:  You feel less than 10 counts in 2 hours, tried twice.  There is no movement in over an hour.  The pattern is changing or taking longer each day to reach 10 counts in 2 hours.  You feel the baby is not moving as he or she usually does. Date: ____________ Movements: ____________ Start time: ____________ Finish time: ____________  Date: ____________ Movements: ____________ Start time: ____________ Finish time: ____________ Date: ____________ Movements: ____________ Start time: ____________ Finish time: ____________ Date: ____________ Movements: ____________ Start time: ____________ Finish time: ____________ Date: ____________ Movements: ____________ Start time: ____________ Finish time: ____________ Date: ____________ Movements: ____________ Start time: ____________ Finish time: ____________ Date: ____________ Movements: ____________ Start time: ____________ Finish time: ____________ Date: ____________ Movements: ____________ Start time: ____________ Finish time: ____________  Date: ____________ Movements: ____________ Start time: ____________ Finish time: ____________ Date: ____________ Movements: ____________ Start time: ____________ Finish time: ____________ Date: ____________ Movements: ____________ Start time: ____________ Finish time: ____________ Date: ____________ Movements: ____________ Start time: ____________ Finish time: ____________ Date: ____________  Movements: ____________ Start time: ____________ Finish time: ____________ Date: ____________ Movements: ____________ Start time: ____________ Finish time: ____________ Date: ____________ Movements: ____________ Start time: ____________ Finish time: ____________  Date: ____________ Movements: ____________ Start time: ____________ Finish time: ____________ Date: ____________ Movements: ____________ Start time: ____________ Finish time: ____________ Date: ____________ Movements: ____________ Start time: ____________ Finish time: ____________ Date: ____________ Movements: ____________ Start time: ____________ Finish time: ____________ Date: ____________ Movements: ____________ Start time: ____________ Finish time: ____________ Date: ____________ Movements: ____________ Start time: ____________ Finish time: ____________ Date: ____________ Movements: ____________ Start time: ____________ Finish time: ____________  Date: ____________ Movements: ____________ Start time: ____________ Finish time: ____________ Date: ____________ Movements: ____________ Start time: ____________ Finish time: ____________ Date: ____________ Movements: ____________ Start time: ____________ Finish time: ____________ Date: ____________ Movements: ____________ Start time: ____________ Finish time: ____________ Date: ____________ Movements: ____________ Start time: ____________ Finish time: ____________ Date: ____________ Movements: ____________ Start time: ____________ Finish time: ____________ Date: ____________ Movements: ____________ Start time: ____________ Finish time: ____________  Date: ____________ Movements: ____________ Start time: ____________ Finish time: ____________ Date: ____________ Movements: ____________ Start time: ____________ Finish time: ____________ Date: ____________ Movements: ____________ Start time: ____________ Finish time: ____________ Date: ____________ Movements: ____________ Start time:  ____________ Finish time: ____________ Date: ____________ Movements: ____________ Start time: ____________ Finish time: ____________ Date: ____________ Movements: ____________ Start time: ____________ Finish time: ____________ Date: ____________ Movements: ____________ Start time: ____________ Finish time: ____________  Date: ____________ Movements: ____________ Start time: ____________ Finish time: ____________ Date: ____________ Movements: ____________ Start   time: ____________ Finish time: ____________ Date: ____________ Movements: ____________ Start time: ____________ Finish time: ____________ Date: ____________ Movements: ____________ Start time: ____________ Finish time: ____________ Date: ____________ Movements: ____________ Start time: ____________ Finish time: ____________ Date: ____________ Movements: ____________ Start time: ____________ Finish time: ____________ Date: ____________ Movements: ____________ Start time: ____________ Finish time: ____________  Date: ____________ Movements: ____________ Start time: ____________ Finish time: ____________ Date: ____________ Movements: ____________ Start time: ____________ Finish time: ____________ Date: ____________ Movements: ____________ Start time: ____________ Finish time: ____________ Date: ____________ Movements: ____________ Start time: ____________ Finish time: ____________ Date: ____________ Movements: ____________ Start time: ____________ Finish time: ____________ Date: ____________ Movements: ____________ Start time: ____________ Finish time: ____________ Date: ____________ Movements: ____________ Start time: ____________ Finish time: ____________  Date: ____________ Movements: ____________ Start time: ____________ Finish time: ____________ Date: ____________ Movements: ____________ Start time: ____________ Finish time: ____________ Date: ____________ Movements: ____________ Start time: ____________ Finish time: ____________ Date:  ____________ Movements: ____________ Start time: ____________ Finish time: ____________ Date: ____________ Movements: ____________ Start time: ____________ Finish time: ____________ Date: ____________ Movements: ____________ Start time: ____________ Finish time: ____________ Document Released: 06/24/2006 Document Revised: 05/11/2012 Document Reviewed: 03/21/2012 [employer] Patient Information 2014 [employer].  

## 2013-07-05 NOTE — Telephone Encounter (Signed)
Opened in error

## 2013-07-12 ENCOUNTER — Ambulatory Visit (INDEPENDENT_AMBULATORY_CARE_PROVIDER_SITE_OTHER): Payer: Medicaid Other | Admitting: Obstetrics and Gynecology

## 2013-07-12 ENCOUNTER — Encounter: Payer: Self-pay | Admitting: Obstetrics and Gynecology

## 2013-07-12 VITALS — BP 120/81 | Wt 166.8 lb

## 2013-07-12 DIAGNOSIS — O09219 Supervision of pregnancy with history of pre-term labor, unspecified trimester: Secondary | ICD-10-CM

## 2013-07-12 DIAGNOSIS — O09899 Supervision of other high risk pregnancies, unspecified trimester: Secondary | ICD-10-CM

## 2013-07-12 NOTE — Patient Instructions (Signed)
Third Trimester of Pregnancy  The third trimester is from week 29 through week 42, months 7 through 9. The third trimester is a time when the fetus is growing rapidly. At the end of the ninth month, the fetus is about 20 inches in length and weighs 6 10 pounds.   BODY CHANGES  Your body goes through many changes during pregnancy. The changes vary from woman to woman.    Your weight will continue to increase. You can expect to gain 25 35 pounds (11 16 kg) by the end of the pregnancy.   You may begin to get stretch marks on your hips, abdomen, and breasts.   You may urinate more often because the fetus is moving lower into your pelvis and pressing on your bladder.   You may develop or continue to have heartburn as a result of your pregnancy.   You may develop constipation because certain hormones are causing the muscles that push waste through your intestines to slow down.   You may develop hemorrhoids or swollen, bulging veins (varicose veins).   You may have pelvic pain because of the weight gain and pregnancy hormones relaxing your joints between the bones in your pelvis. Back aches may result from over exertion of the muscles supporting your posture.   Your breasts will continue to grow and be tender. A yellow discharge may leak from your breasts called colostrum.   Your belly button may stick out.   You may feel short of breath because of your expanding uterus.   You may notice the fetus "dropping," or moving lower in your abdomen.   You may have a bloody mucus discharge. This usually occurs a few days to a week before labor begins.   Your cervix becomes thin and soft (effaced) near your due date.  WHAT TO EXPECT AT YOUR PRENATAL EXAMS   You will have prenatal exams every 2 weeks until week 36. Then, you will have weekly prenatal exams. During a routine prenatal visit:   You will be weighed to make sure you and the fetus are growing normally.   Your blood pressure is taken.   Your abdomen will be  measured to track your baby's growth.   The fetal heartbeat will be listened to.   Any test results from the previous visit will be discussed.   You may have a cervical check near your due date to see if you have effaced.  At around 36 weeks, your caregiver will check your cervix. At the same time, your caregiver will also perform a test on the secretions of the vaginal tissue. This test is to determine if a type of bacteria, Group B streptococcus, is present. Your caregiver will explain this further.  Your caregiver may ask you:   What your birth plan is.   How you are feeling.   If you are feeling the baby move.   If you have had any abnormal symptoms, such as leaking fluid, bleeding, severe headaches, or abdominal cramping.   If you have any questions.  Other tests or screenings that may be performed during your third trimester include:   Blood tests that check for low iron levels (anemia).   Fetal testing to check the health, activity level, and growth of the fetus. Testing is done if you have certain medical conditions or if there are problems during the pregnancy.  FALSE LABOR  You may feel small, irregular contractions that eventually go away. These are called Braxton Hicks contractions, or   false labor. Contractions may last for hours, days, or even weeks before true labor sets in. If contractions come at regular intervals, intensify, or become painful, it is best to be seen by your caregiver.   SIGNS OF LABOR    Menstrual-like cramps.   Contractions that are 5 minutes apart or less.   Contractions that start on the top of the uterus and spread down to the lower abdomen and back.   A sense of increased pelvic pressure or back pain.   A watery or bloody mucus discharge that comes from the vagina.  If you have any of these signs before the 37th week of pregnancy, call your caregiver right away. You need to go to the hospital to get checked immediately.  HOME CARE INSTRUCTIONS    Avoid all  smoking, herbs, alcohol, and unprescribed drugs. These chemicals affect the formation and growth of the baby.   Follow your caregiver's instructions regarding medicine use. There are medicines that are either safe or unsafe to take during pregnancy.   Exercise only as directed by your caregiver. Experiencing uterine cramps is a good sign to stop exercising.   Continue to eat regular, healthy meals.   Wear a good support bra for breast tenderness.   Do not use hot tubs, steam rooms, or saunas.   Wear your seat belt at all times when driving.   Avoid raw meat, uncooked cheese, cat litter boxes, and soil used by cats. These carry germs that can cause birth defects in the baby.   Take your prenatal vitamins.   Try taking a stool softener (if your caregiver approves) if you develop constipation. Eat more high-fiber foods, such as fresh vegetables or fruit and whole grains. Drink plenty of fluids to keep your urine clear or pale yellow.   Take warm sitz baths to soothe any pain or discomfort caused by hemorrhoids. Use hemorrhoid cream if your caregiver approves.   If you develop varicose veins, wear support hose. Elevate your feet for 15 minutes, 3 4 times a day. Limit salt in your diet.   Avoid heavy lifting, wear low heal shoes, and practice good posture.   Rest a lot with your legs elevated if you have leg cramps or low back pain.   Visit your dentist if you have not gone during your pregnancy. Use a soft toothbrush to brush your teeth and be gentle when you floss.   A sexual relationship may be continued unless your caregiver directs you otherwise.   Do not travel far distances unless it is absolutely necessary and only with the approval of your caregiver.   Take prenatal classes to understand, practice, and ask questions about the labor and delivery.   Make a trial run to the hospital.   Pack your hospital bag.   Prepare the baby's nursery.   Continue to go to all your prenatal visits as directed  by your caregiver.  SEEK MEDICAL CARE IF:   You are unsure if you are in labor or if your water has broken.   You have dizziness.   You have mild pelvic cramps, pelvic pressure, or nagging pain in your abdominal area.   You have persistent nausea, vomiting, or diarrhea.   You have a bad smelling vaginal discharge.   You have pain with urination.  SEEK IMMEDIATE MEDICAL CARE IF:    You have a fever.   You are leaking fluid from your vagina.   You have spotting or bleeding from your vagina.     You have severe abdominal cramping or pain.   You have rapid weight loss or gain.   You have shortness of breath with chest pain.   You notice sudden or extreme swelling of your face, hands, ankles, feet, or legs.   You have not felt your baby move in over an hour.   You have severe headaches that do not go away with medicine.   You have vision changes.  Document Released: 05/19/2001 Document Revised: 01/25/2013 Document Reviewed: 07/26/2012  [employer] Patient Information 2014 [employer].

## 2013-07-12 NOTE — Progress Notes (Signed)
P = 90   Pt desires to have membranes swept today.

## 2013-07-12 NOTE — Progress Notes (Signed)
Cx stretchy, posterior. GBS neg. Membranes swept per request. Reviewed plans and s/sx labor, FM awareness.

## 2013-07-14 ENCOUNTER — Encounter: Payer: Medicaid Other | Admitting: Obstetrics & Gynecology

## 2013-07-15 ENCOUNTER — Encounter (HOSPITAL_COMMUNITY): Payer: Self-pay | Admitting: *Deleted

## 2013-07-15 ENCOUNTER — Inpatient Hospital Stay (HOSPITAL_COMMUNITY)
Admission: AD | Admit: 2013-07-15 | Discharge: 2013-07-15 | Disposition: A | Discharge status: Home / Self Care | Payer: Medicaid Other | Source: Ambulatory Visit | Attending: Obstetrics & Gynecology | Admitting: Obstetrics & Gynecology

## 2013-07-15 DIAGNOSIS — R109 Unspecified abdominal pain: Secondary | ICD-10-CM | POA: Insufficient documentation

## 2013-07-15 DIAGNOSIS — O9989 Other specified diseases and conditions complicating pregnancy, childbirth and the puerperium: Principal | ICD-10-CM

## 2013-07-15 DIAGNOSIS — O99891 Other specified diseases and conditions complicating pregnancy: Secondary | ICD-10-CM | POA: Insufficient documentation

## 2013-07-15 NOTE — Discharge Instructions (Signed)

## 2013-07-15 NOTE — MAU Note (Signed)
Patient presents with complaints of abdominal pain and pressure since Thursday. Patient states she had her membranes stripped Wednesday in the office.

## 2013-07-16 ENCOUNTER — Encounter (HOSPITAL_COMMUNITY): Payer: Self-pay | Admitting: *Deleted

## 2013-07-16 ENCOUNTER — Inpatient Hospital Stay (HOSPITAL_COMMUNITY)
Admission: AD | Admit: 2013-07-16 | Discharge: 2013-07-17 | DRG: 775 | Disposition: A | Discharge status: Home / Self Care | Payer: Medicaid Other | Source: Ambulatory Visit | Attending: Obstetrics & Gynecology | Admitting: Obstetrics & Gynecology

## 2013-07-16 ENCOUNTER — Inpatient Hospital Stay (HOSPITAL_COMMUNITY): Payer: Medicaid Other | Admitting: Anesthesiology

## 2013-07-16 ENCOUNTER — Encounter (HOSPITAL_COMMUNITY): Payer: Medicaid Other | Admitting: Anesthesiology

## 2013-07-16 DIAGNOSIS — O4290 Premature rupture of membranes, unspecified as to length of time between rupture and onset of labor, unspecified weeks of gestation: Secondary | ICD-10-CM | POA: Diagnosis present

## 2013-07-16 DIAGNOSIS — O09219 Supervision of pregnancy with history of pre-term labor, unspecified trimester: Secondary | ICD-10-CM

## 2013-07-16 LAB — RPR: RPR Ser Ql: NONREACTIVE

## 2013-07-16 LAB — CBC
HCT: 35.5 % — ABNORMAL LOW (ref 36.0–46.0)
HEMOGLOBIN: 11.3 g/dL — AB (ref 12.0–15.0)
MCH: 23.5 pg — AB (ref 26.0–34.0)
MCHC: 31.8 g/dL (ref 30.0–36.0)
MCV: 73.8 fL — AB (ref 78.0–100.0)
Platelets: 246 10*3/uL (ref 150–400)
RBC: 4.81 MIL/uL (ref 3.87–5.11)
RDW: 17.3 % — AB (ref 11.5–15.5)
WBC: 7.5 10*3/uL (ref 4.0–10.5)

## 2013-07-16 LAB — POCT FERN TEST: POCT Fern Test: POSITIVE

## 2013-07-16 MED ORDER — WITCH HAZEL-GLYCERIN EX PADS: 1.0000 "application " | MEDICATED_PAD | CUTANEOUS | Status: DC | PRN

## 2013-07-16 MED ORDER — DIPHENHYDRAMINE HCL 25 MG PO CAPS
25.0000 mg | ORAL_CAPSULE | Freq: Four times a day (QID) | ORAL | Status: DC | PRN
  Administered 2013-07-16: 25 mg via ORAL
  Filled 2013-07-16: qty 1

## 2013-07-16 MED ORDER — OXYCODONE-ACETAMINOPHEN 5-325 MG PO TABS: 1.0000 | ORAL_TABLET | ORAL | Status: DC | PRN

## 2013-07-16 MED ORDER — MISOPROSTOL 200 MCG PO TABS
ORAL_TABLET | ORAL | Status: AC
  Filled 2013-07-16: qty 4

## 2013-07-16 MED ORDER — FLEET ENEMA 7-19 GM/118ML RE ENEM: 1.0000 | ENEMA | RECTAL | Status: DC | PRN

## 2013-07-16 MED ORDER — TERBUTALINE SULFATE 1 MG/ML IJ SOLN: 0.2500 mg | Freq: Once | INTRAMUSCULAR | Status: DC | PRN

## 2013-07-16 MED ORDER — LIDOCAINE HCL (PF) 1 % IJ SOLN
INTRAMUSCULAR | Status: DC | PRN
  Administered 2013-07-16 (×3): 5 mL

## 2013-07-16 MED ORDER — OXYTOCIN 40 UNITS IN LACTATED RINGERS INFUSION - SIMPLE MED: 62.5000 mL/h | INTRAVENOUS | Status: DC

## 2013-07-16 MED ORDER — ZOLPIDEM TARTRATE 5 MG PO TABS: 5.0000 mg | ORAL_TABLET | Freq: Every evening | ORAL | Status: DC | PRN

## 2013-07-16 MED ORDER — MAGNESIUM HYDROXIDE 400 MG/5ML PO SUSP: 30.0000 mL | ORAL | Status: DC | PRN

## 2013-07-16 MED ORDER — SENNOSIDES-DOCUSATE SODIUM 8.6-50 MG PO TABS
2.0000 | ORAL_TABLET | ORAL | Status: DC
  Administered 2013-07-16: 2 via ORAL
  Filled 2013-07-16: qty 2

## 2013-07-16 MED ORDER — MISOPROSTOL 200 MCG PO TABS
800.0000 ug | ORAL_TABLET | Freq: Once | ORAL | Status: AC
  Administered 2013-07-16: 800 ug via RECTAL
  Filled 2013-07-16: qty 4

## 2013-07-16 MED ORDER — METHYLERGONOVINE MALEATE 0.2 MG/ML IJ SOLN
0.2000 mg | Freq: Once | INTRAMUSCULAR | Status: AC
  Administered 2013-07-16: 0.2 mg via INTRAMUSCULAR

## 2013-07-16 MED ORDER — MEASLES, MUMPS & RUBELLA VAC ~~LOC~~ INJ: 0.5000 mL | INJECTION | Freq: Once | SUBCUTANEOUS | Status: DC

## 2013-07-16 MED ORDER — OXYTOCIN BOLUS FROM INFUSION
500.0000 mL | INTRAVENOUS | Status: DC
  Administered 2013-07-16: 500 mL via INTRAVENOUS

## 2013-07-16 MED ORDER — PHENYLEPHRINE 40 MCG/ML (10ML) SYRINGE FOR IV PUSH (FOR BLOOD PRESSURE SUPPORT)
80.0000 ug | PREFILLED_SYRINGE | INTRAVENOUS | Status: AC | PRN
  Administered 2013-07-16 (×3): 40 ug via INTRAVENOUS

## 2013-07-16 MED ORDER — OXYTOCIN 40 UNITS IN LACTATED RINGERS INFUSION - SIMPLE MED
1.0000 m[IU]/min | INTRAVENOUS | Status: DC
Start: 2013-07-16 — End: 2013-07-16
  Administered 2013-07-16: 2 m[IU]/min via INTRAVENOUS
  Filled 2013-07-16: qty 1000

## 2013-07-16 MED ORDER — LANOLIN HYDROUS EX OINT
1.0000 | TOPICAL_OINTMENT | CUTANEOUS | Status: DC | PRN
Start: 2013-07-16 — End: 2013-07-17

## 2013-07-16 MED ORDER — METHYLERGONOVINE MALEATE 0.2 MG PO TABS: 0.2000 mg | ORAL_TABLET | ORAL | Status: DC | PRN

## 2013-07-16 MED ORDER — LACTATED RINGERS IV SOLN: 500.0000 mL | Freq: Once | INTRAVENOUS | Status: DC

## 2013-07-16 MED ORDER — LIDOCAINE HCL (PF) 1 % IJ SOLN
30.0000 mL | INTRAMUSCULAR | Status: DC | PRN
  Filled 2013-07-16: qty 30

## 2013-07-16 MED ORDER — DIPHENHYDRAMINE HCL 50 MG/ML IJ SOLN: 12.5000 mg | INTRAMUSCULAR | Status: DC | PRN

## 2013-07-16 MED ORDER — METHYLERGONOVINE MALEATE 0.2 MG/ML IJ SOLN: 0.2000 mg | INTRAMUSCULAR | Status: DC | PRN

## 2013-07-16 MED ORDER — SIMETHICONE 80 MG PO CHEW: 80.0000 mg | CHEWABLE_TABLET | ORAL | Status: DC | PRN

## 2013-07-16 MED ORDER — DIBUCAINE 1 % RE OINT: 1.0000 "application " | TOPICAL_OINTMENT | RECTAL | Status: DC | PRN

## 2013-07-16 MED ORDER — FENTANYL 2.5 MCG/ML BUPIVACAINE 1/10 % EPIDURAL INFUSION (WH - ANES)
INTRAMUSCULAR | Status: DC | PRN
  Administered 2013-07-16: 14 mL/h via EPIDURAL

## 2013-07-16 MED ORDER — LACTATED RINGERS IV SOLN
INTRAVENOUS | Status: DC
  Administered 2013-07-16: 09:00:00 via INTRAVENOUS

## 2013-07-16 MED ORDER — IBUPROFEN 600 MG PO TABS
600.0000 mg | ORAL_TABLET | Freq: Four times a day (QID) | ORAL | Status: DC
  Administered 2013-07-16 – 2013-07-17 (×4): 600 mg via ORAL
  Filled 2013-07-16 (×4): qty 1

## 2013-07-16 MED ORDER — TETANUS-DIPHTH-ACELL PERTUSSIS 5-2.5-18.5 LF-MCG/0.5 IM SUSP: 0.5000 mL | Freq: Once | INTRAMUSCULAR | Status: DC

## 2013-07-16 MED ORDER — PHENYLEPHRINE 40 MCG/ML (10ML) SYRINGE FOR IV PUSH (FOR BLOOD PRESSURE SUPPORT)
80.0000 ug | PREFILLED_SYRINGE | INTRAVENOUS | Status: DC | PRN
  Filled 2013-07-16: qty 2
  Filled 2013-07-16: qty 10

## 2013-07-16 MED ORDER — ONDANSETRON HCL 4 MG/2ML IJ SOLN: 4.0000 mg | Freq: Four times a day (QID) | INTRAMUSCULAR | Status: DC | PRN

## 2013-07-16 MED ORDER — EPHEDRINE 5 MG/ML INJ
10.0000 mg | INTRAVENOUS | Status: DC | PRN
  Filled 2013-07-16: qty 2

## 2013-07-16 MED ORDER — IBUPROFEN 600 MG PO TABS: 600.0000 mg | ORAL_TABLET | Freq: Four times a day (QID) | ORAL | Status: DC | PRN

## 2013-07-16 MED ORDER — PRENATAL MULTIVITAMIN CH
1.0000 | ORAL_TABLET | Freq: Every day | ORAL | Status: DC
  Administered 2013-07-17: 1 via ORAL
  Filled 2013-07-16: qty 1

## 2013-07-16 MED ORDER — FERROUS SULFATE 325 (65 FE) MG PO TABS
325.0000 mg | ORAL_TABLET | Freq: Two times a day (BID) | ORAL | Status: DC
  Administered 2013-07-16 – 2013-07-17 (×2): 325 mg via ORAL
  Filled 2013-07-16 (×2): qty 1

## 2013-07-16 MED ORDER — BENZOCAINE-MENTHOL 20-0.5 % EX AERO
1.0000 "application " | INHALATION_SPRAY | CUTANEOUS | Status: DC | PRN
  Administered 2013-07-16: 1 via TOPICAL
  Filled 2013-07-16: qty 56

## 2013-07-16 MED ORDER — ONDANSETRON HCL 4 MG PO TABS: 4.0000 mg | ORAL_TABLET | ORAL | Status: DC | PRN

## 2013-07-16 MED ORDER — LACTATED RINGERS IV SOLN
500.0000 mL | INTRAVENOUS | Status: DC | PRN
  Administered 2013-07-16: 500 mL via INTRAVENOUS

## 2013-07-16 MED ORDER — CITRIC ACID-SODIUM CITRATE 334-500 MG/5ML PO SOLN: 30.0000 mL | ORAL | Status: DC | PRN

## 2013-07-16 MED ORDER — EPHEDRINE 5 MG/ML INJ
10.0000 mg | INTRAVENOUS | Status: DC | PRN
  Filled 2013-07-16: qty 2
  Filled 2013-07-16: qty 4

## 2013-07-16 MED ORDER — FENTANYL 2.5 MCG/ML BUPIVACAINE 1/10 % EPIDURAL INFUSION (WH - ANES)
14.0000 mL/h | INTRAMUSCULAR | Status: DC | PRN
  Filled 2013-07-16: qty 125

## 2013-07-16 MED ORDER — ONDANSETRON HCL 4 MG/2ML IJ SOLN: 4.0000 mg | INTRAMUSCULAR | Status: DC | PRN

## 2013-07-16 MED ORDER — ACETAMINOPHEN 325 MG PO TABS: 650.0000 mg | ORAL_TABLET | ORAL | Status: DC | PRN

## 2013-07-16 NOTE — Progress Notes (Signed)
Carrie Bean is a 28 y.o. [id number] at 4470w1d.  Subjective: Comfortable w/ epidural  Objective: BP 86/52  Pulse 84  Temp(Src) 97.9 F (36.6 C) (Oral)  Resp 18  Ht 5\' 10"  (1.778 m)  Wt 75.751 kg (167 lb)  BMI 23.96 kg/m2  SpO2 100%      FHT:  FHR: 130 bpm, variability: moderate,  accelerations:  Present,  decelerations:  Absent UC:   irregular, every 5-8 minutes, moderate SVE:   Dilation: 6 Effacement (%): 80 Station: 0 Exam by:: patti moore rn  Labs: Lab Results  Component Value Date   WBC 7.5 07/16/2013   HGB 11.3* 07/16/2013   HCT 35.5* 07/16/2013   MCV 73.8* 07/16/2013   PLT 246 07/16/2013    Assessment / Plan: Protracted latent phase  Labor: Progressing slowly, but contraction pattern inadequate. Preeclampsia:  NA Fetal Wellbeing:  Category I Pain Control:  Epidural I/D:  n/a Anticipated MOD:  NSVD Start pitocin augmentation  Carrie Bean 07/16/2013, 11:08 AM

## 2013-07-16 NOTE — H&P (Addendum)
Carrie Bean is a 28 y.o. [id number] female presenting for SROM at 4am this morning.  She is not feeling strong contractions, pressure, or an urge to push.  Reports some bloody show when wiping. No other VB. No pain in abdomen. Reports good FM.   Denies HA, blurry vision, dizziness. Denies complications with this pregnancy.  Denies complications with past pregnancies or deliveries.   Maternal Medical History:  Reason for admission: Nausea.    OB History   Grav Para Term Preterm Abortions TAB SAB Ect Mult Living   4 3 2 1  0 0 0 0 0 3     Past Medical History  Diagnosis Date  . Preterm labor   . Urinary tract infection   . Gonorrhea   . Chlamydia   . Abnormal Pap smear     f/u was normal   Past Surgical History  Procedure Laterality Date  . No past surgeries     Family History: family history is negative for Other and Hearing loss. Social History:  reports that she has never smoked. She has never used smokeless tobacco. She reports that she does not drink alcohol or use illicit drugs.   Prenatal Transfer Tool  Maternal Diabetes: No Genetic Screening: Declined Maternal Ultrasounds/Referrals: Normal Fetal Ultrasounds or other Referrals:  None Maternal Substance Abuse:  No Significant Maternal Medications:  None Significant Maternal Lab Results:  None Other Comments:  None  Review of Systems  Constitutional: Negative for fever.  Eyes: Negative for blurred vision and double vision.  Respiratory: Negative for cough and shortness of breath.   Cardiovascular: Negative for chest pain.  Gastrointestinal: Negative for nausea, vomiting, abdominal pain and diarrhea.  Genitourinary: Negative for dysuria.  Musculoskeletal: Negative for myalgias.  Neurological: Negative for dizziness, tingling and headaches.    Dilation: 4 Effacement (%): 80 Station: -1 Exam by:: Lucy ChrisErin Bray RNC Blood pressure 119/73, pulse 104, temperature 97.8 F (36.6 C), temperature source Oral, resp.  rate 18, height 5\' 10"  (1.778 m), weight 75.751 kg (167 lb), SpO2 100.00%. Exam Physical Exam  Constitutional: She is oriented to person, place, and time. She appears well-developed and well-nourished.  HENT:  Head: Normocephalic and atraumatic.  Eyes: Conjunctivae are normal.  Cardiovascular: Normal rate, regular rhythm, normal heart sounds and intact distal pulses.   Respiratory: Effort normal and breath sounds normal. No respiratory distress.  GI:  Appropriately gravid uterus. Est 6.5 lbs by leopolds. nontender  Genitourinary: Vagina normal and uterus normal.  Musculoskeletal: She exhibits no edema and no tenderness.  Neurological: She is alert and oriented to person, place, and time.  No focal deficits  Skin: Skin is warm and dry.  Psychiatric: She has a normal mood and affect. Her behavior is normal. Thought content normal.    Prenatal labs: ABO, Rh: B/POS/-- (11/20 1405) Antibody: NEG (11/20 1405) Rubella: 2.32 (11/20 1405) RPR: NON REAC (11/20 1405)  HBsAg: NEGATIVE (11/20 1405)  HIV: NON REACTIVE (11/20 1405)  GBS: Negative (01/21 0000)   FWB: Cat 1. Baseline 125. Accels present. Decels absent. UC's q676min. Vertex by Bedside US  Assessment/Plan:  #SROM  - Admit to labor and delivery.  - Expectant management  Pain: patient requests epidural Pre-E: Prenatal BP wnl. EFW: 2 GTT 121.  Est. 6.5lbs by leopolds. GBS neg  MOC: mirena MOF: breast/bottle   Quincy SimmondsFeeney, Patricia L 07/16/2013, 5:51 AM  I examined pt and agree with documentation above and resident plan of care. [facility],Semaje Kinker

## 2013-07-16 NOTE — Anesthesia Preprocedure Evaluation (Signed)

## 2013-07-16 NOTE — Lactation Note (Signed)
This note was copied from the chart of Carrie Bean. Lactation Consultation Note  Patient Name: Carrie Bean: 07/16/2013 Reason for consult: Initial assessment Mom reports she plans to breast and bottle feed. LC reviewed risk of early supplementation to BF success. Encouraged to BF with each feeding before giving any supplement to encourage milk production, prevent engorgement and protect milk supply. Guidelines for supplementing with BF given to Mom. BF basics reviewed. Cluster feeding discussed. Lactation Brochure left for review. Advised of OP services and support group. Encouraged Mom to call for assist as needed.   Maternal Data Formula Feeding for Exclusion: Yes Reason for exclusion: Mother's choice to formula and breast feed on admission Infant to breast within first hour of birth: Yes Has patient been taught Hand Expression?: Yes Does the patient have breastfeeding experience prior to this delivery?: Yes  Feeding Feeding Type: Breast Fed  LATCH Score/Interventions       Type of Nipple: Everted at rest and after stimulation  Comfort (Breast/Nipple): Soft / non-tender           Lactation Tools Discussed/Used     Consult Status Consult Status: Follow-up Bean: 07/17/13 Follow-up type: In-patient    Alfred LevinsGranger, Hannahgrace Lalli Ann 07/16/2013, 8:18 PM

## 2013-07-16 NOTE — H&P (Signed)
I examined pt and agree with documentation above and resident plan of care. [name],Carrie Bean  

## 2013-07-16 NOTE — MAU Note (Signed)
Water broke around 430 am 

## 2013-07-16 NOTE — Anesthesia Procedure Notes (Signed)
Epidural Patient location during procedure: OB  Staffing Anesthesiologist: Kesleigh Morson Performed by: anesthesiologist   Preanesthetic Checklist Completed: patient identified, site marked, surgical consent, pre-op evaluation, timeout performed, IV checked, risks and benefits discussed and monitors and equipment checked  Epidural Patient position: sitting Prep: ChloraPrep Patient monitoring: heart rate, continuous pulse ox and blood pressure Approach: right paramedian Injection technique: LOR saline  Needle:  Needle type: Tuohy  Needle gauge: 17 G Needle length: 9 cm and 9 Needle insertion depth: 6 cm Catheter type: closed end flexible Catheter size: 20 Guage Catheter at skin depth: 11 cm Test dose: negative  Assessment Events: blood not aspirated, injection not painful, no injection resistance, negative IV test and no paresthesia  Additional Notes   Patient tolerated the insertion well without complications.   

## 2013-07-17 MED ORDER — IBUPROFEN 600 MG PO TABS: 600.0000 mg | ORAL_TABLET | Freq: Four times a day (QID) | ORAL | Status: DC

## 2013-07-17 NOTE — Discharge Summary (Signed)
Attestation of Attending Supervision of Advanced Practitioner (CNM/NP): Evaluation and management procedures were performed by the Advanced Practitioner under my supervision and collaboration.  I have reviewed the Advanced Practitioner's note and chart, and I agree with the management and plan.  Kayleen Alig 07/17/2013 8:14 AM   

## 2013-07-17 NOTE — Progress Notes (Signed)
Ur chart review completed.  

## 2013-07-17 NOTE — Anesthesia Postprocedure Evaluation (Signed)
  Anesthesia Post-op Note  Patient: Carrie Bean  Procedure(s) Performed: * No procedures listed *  Patient Location: Mother/Baby  Anesthesia Type:Epidural  Level of Consciousness: awake  Airway and Oxygen Therapy: Patient Spontanous Breathing  Post-op Pain: none  Post-op Assessment: Patient's Cardiovascular Status Stable, Respiratory Function Stable, Patent Airway, No signs of Nausea or vomiting, Adequate PO intake, Pain level controlled, No headache, No backache, No residual numbness and No residual motor weakness  Post-op Vital Signs: Reviewed and stable  Complications: No apparent anesthesia complications

## 2013-07-17 NOTE — Discharge Summary (Signed)
Obstetric Discharge Summary Reason for Admission: rupture of membranes Prenatal Procedures: none Intrapartum Procedures: spontaneous vaginal delivery Postpartum Procedures: none Complications-Operative and Postpartum: none Hemoglobin  Date Value Range Status  07/16/2013 11.3* 12.0 - 15.0 g/dL Final     HCT  Date Value Range Status  07/16/2013 35.5* 36.0 - 46.0 % Final   Hospital Course: Patient is a 28 y.o. [id number] who was admitted at 5547w1d with SROM. She delivered vaginally without complications. She and her female infant did well postpartum and she was discharged on PPD#1.   Physical Exam:  General: alert, cooperative and no distress Lochia: appropriate Uterine Fundus: firm Incision: na DVT Evaluation: No evidence of DVT seen on physical exam. No cords or calf tenderness. No significant calf/ankle edema.  Discharge Diagnoses: Term Pregnancy-delivered  Discharge Information: Date: 07/17/2013 Activity: pelvic rest Diet: routine Medications: PNV and Ibuprofen Condition: stable Instructions: refer to practice specific booklet Discharge to: home Contraception: mirena Follow-up Information   Follow up with [facility]. Schedule an appointment as soon as possible for a visit in 4 weeks.      Newborn Data: Live born female  Birth Weight: 7 lb 4.9 oz (3315 g) APGAR: 9, 9  Home with mother.  Beverely Lowdamo, Elena 07/17/2013, 7:37 AM  I spoke with and examined patient and agree with resident's note and plan of care.  Tawana ScaleMichael Ryan Lulamae Skorupski, MD OB Fellow 07/17/2013 8:05 AM

## 2013-07-17 NOTE — Discharge Instructions (Signed)

## 2013-07-24 ENCOUNTER — Encounter: Payer: Medicaid Other | Admitting: Family Medicine

## 2013-09-21 ENCOUNTER — Telehealth: Payer: Self-pay | Admitting: *Deleted

## 2013-09-21 ENCOUNTER — Ambulatory Visit: Payer: Medicaid Other | Admitting: Obstetrics & Gynecology

## 2013-09-21 NOTE — Telephone Encounter (Signed)
Carrie Bean missed her postpartum appointment. Called her and she states she forgot- she does want to reschedule it. Informed her front office will call her with new appt.

## 2013-09-29 ENCOUNTER — Encounter (HOSPITAL_BASED_OUTPATIENT_CLINIC_OR_DEPARTMENT_OTHER): Payer: Self-pay | Admitting: Emergency Medicine

## 2013-09-29 ENCOUNTER — Emergency Department (HOSPITAL_BASED_OUTPATIENT_CLINIC_OR_DEPARTMENT_OTHER)
Admission: EM | Admit: 2013-09-29 | Discharge: 2013-09-29 | Disposition: A | Discharge status: Home / Self Care | Payer: Medicaid Other | Attending: Emergency Medicine | Admitting: Emergency Medicine

## 2013-09-29 DIAGNOSIS — Z3202 Encounter for pregnancy test, result negative: Secondary | ICD-10-CM | POA: Insufficient documentation

## 2013-09-29 DIAGNOSIS — Z8619 Personal history of other infectious and parasitic diseases: Secondary | ICD-10-CM | POA: Insufficient documentation

## 2013-09-29 DIAGNOSIS — Z792 Long term (current) use of antibiotics: Secondary | ICD-10-CM | POA: Insufficient documentation

## 2013-09-29 DIAGNOSIS — Z8751 Personal history of pre-term labor: Secondary | ICD-10-CM | POA: Insufficient documentation

## 2013-09-29 DIAGNOSIS — Z791 Long term (current) use of non-steroidal anti-inflammatories (NSAID): Secondary | ICD-10-CM | POA: Insufficient documentation

## 2013-09-29 DIAGNOSIS — Z79899 Other long term (current) drug therapy: Secondary | ICD-10-CM | POA: Insufficient documentation

## 2013-09-29 DIAGNOSIS — N390 Urinary tract infection, site not specified: Secondary | ICD-10-CM

## 2013-09-29 LAB — URINE MICROSCOPIC-ADD ON

## 2013-09-29 LAB — URINALYSIS, ROUTINE W REFLEX MICROSCOPIC
Bilirubin Urine: NEGATIVE
GLUCOSE, UA: NEGATIVE mg/dL
KETONES UR: NEGATIVE mg/dL
Nitrite: POSITIVE — AB
PH: 6 (ref 5.0–8.0)
Protein, ur: 100 mg/dL — AB
Specific Gravity, Urine: 1.02 (ref 1.005–1.030)
Urobilinogen, UA: 1 mg/dL (ref 0.0–1.0)

## 2013-09-29 LAB — PREGNANCY, URINE: Preg Test, Ur: NEGATIVE

## 2013-09-29 MED ORDER — PHENAZOPYRIDINE HCL 200 MG PO TABS: 200.0000 mg | ORAL_TABLET | Freq: Three times a day (TID) | ORAL | Status: DC

## 2013-09-29 MED ORDER — CEPHALEXIN 500 MG PO CAPS: 500.0000 mg | ORAL_CAPSULE | Freq: Four times a day (QID) | ORAL | Status: DC

## 2013-09-29 NOTE — Discharge Instructions (Signed)
Urinary Tract Infection °A urinary tract infection (UTI) can occur any place along the urinary tract. The tract includes the kidneys, ureters, bladder, and urethra. A type of germ called bacteria often causes a UTI. UTIs are often helped with antibiotic medicine.  °HOME CARE  °· If given, take antibiotics as told by your doctor. Finish them even if you start to feel better. °· Drink enough fluids to keep your pee (urine) clear or pale yellow. °· Avoid tea, drinks with caffeine, and bubbly (carbonated) drinks. °· Pee often. Avoid holding your pee in for a long time. °· Pee before and after having sex (intercourse). °· Wipe from front to back after you poop (bowel movement) if you are a woman. Use each tissue only once. °GET HELP RIGHT AWAY IF:  °· You have back pain. °· You have lower belly (abdominal) pain. °· You have chills. °· You feel sick to your stomach (nauseous). °· You throw up (vomit). °· Your burning or discomfort with peeing does not go away. °· You have a fever. °· Your symptoms are not better in 3 days. °MAKE SURE YOU:  °· Understand these instructions. °· Will watch your condition. °· Will get help right away if you are not doing well or get worse. °Document Released: 11/11/2007 Document Revised: 02/17/2012 Document Reviewed: 12/24/2011 °ExitCare® Patient Information ©2014 [employer]. ° °

## 2013-09-29 NOTE — ED Notes (Signed)
Pt. Reports symptoms of a UTI.  Pt. Reports UTIs in past with last 3 pregnancys and this feels the same.  Pt. Reports she is not pregnant now.

## 2013-10-01 LAB — URINE CULTURE: Colony Count: >=100000

## 2013-10-01 NOTE — ED Provider Notes (Signed)
CSN: [id number]     Arrival date & time 09/29/13  1302 History   First MD Initiated Contact with Patient 09/29/13 1314     Chief Complaint  Patient presents with  . Back Pain     (Consider location/radiation/quality/duration/timing/severity/associated sxs/prior Treatment) Patient is a 28 y.o. female presenting with dysuria. The history is provided by the patient.  Dysuria Pain quality:  Aching and burning Pain severity:  Moderate Onset quality:  Gradual Duration:  2 days Timing:  Constant Progression:  Unchanged Chronicity:  Recurrent Recent urinary tract infections: yes   Relieved by:  Nothing Exacerbated by: urination. Ineffective treatments:  None tried Urinary symptoms: discolored urine and frequent urination   Urinary symptoms: no hematuria, no hesitancy and no bladder incontinence   Associated symptoms: no abdominal pain, no fever, no flank pain, no genital lesions, no nausea, no vaginal discharge and no vomiting   Associated symptoms comment:  Low back pain Risk factors: recurrent urinary tract infections   Risk factors: no hx of pyelonephritis, not pregnant, no renal disease, no sexually transmitted infections and no urinary catheter     Past Medical History  Diagnosis Date  . Preterm labor   . Urinary tract infection   . Gonorrhea   . Chlamydia   . Abnormal Pap smear     f/u was normal   Past Surgical History  Procedure Laterality Date  . No past surgeries     Family History  Problem Relation Age of Onset  . Other Neg Hx   . Hearing loss Neg Hx    History  Substance Use Topics  . Smoking status: Never Smoker   . Smokeless tobacco: Never Used  . Alcohol Use: No   OB History   Grav Para Term Preterm Abortions TAB SAB Ect Mult Living   4 4 3 1  0 0 0 0 0 4     Review of Systems  Constitutional: Negative for fever, activity change and appetite change.  Gastrointestinal: Negative for nausea, vomiting and abdominal pain.  Genitourinary: Positive for  dysuria. Negative for flank pain, decreased urine volume, vaginal discharge, difficulty urinating, genital sores, menstrual problem and pelvic pain.  Musculoskeletal: Positive for back pain.  Neurological: Negative for weakness and numbness.  All other systems reviewed and are negative.     Allergies  Review of patient's allergies indicates no known allergies.  Home Medications   Prior to Admission medications   Medication Sig Start Date End Date Taking? Authorizing Provider  cephALEXin (KEFLEX) 500 MG capsule Take 1 capsule (500 mg total) by mouth 4 (four) times daily. For 7 days 09/29/13   Cale Bethard L. Annsleigh Dragoo, PA-C  ibuprofen (ADVIL,MOTRIN) 600 MG tablet Take 1 tablet (600 mg total) by mouth every 6 (six) hours. 07/17/13   Beverely LowElena Adamo, MD  phenazopyridine (PYRIDIUM) 200 MG tablet Take 1 tablet (200 mg total) by mouth 3 (three) times daily. 09/29/13   Xavious Sharrar L. Hason Ofarrell, PA-C  Prenatal Vit-Fe Fumarate-FA (PRENATAL MULTIVITAMIN) TABS Take 1 tablet by mouth daily.    Historical Provider, MD   BP 114/61  Pulse 80  Temp(Src) 97.6 F (36.4 C) (Oral)  Resp 17  Ht 5\' 8"  (1.727 m)  Wt 130 lb (58.968 kg)  BMI 19.77 kg/m2  LMP 09/22/2013  Breastfeeding? Yes Physical Exam  Nursing note and vitals reviewed. Constitutional: She is oriented to person, place, and time. She appears well-developed and well-nourished. No distress.  HENT:  Head: Normocephalic and atraumatic.  Cardiovascular: Normal rate, regular rhythm, normal heart  sounds and intact distal pulses.   No murmur heard. Pulmonary/Chest: Effort normal and breath sounds normal. No respiratory distress.  Abdominal: Soft. Normal appearance and bowel sounds are normal. She exhibits no distension and no mass. There is no tenderness. There is no guarding and no CVA tenderness.  Musculoskeletal: Normal range of motion. She exhibits no edema.       Lumbar back: She exhibits tenderness. She exhibits normal range of motion, no bony tenderness and  no swelling.  ttp of the lumbar paraspinal muscles.  No CVA tenderness or spinal tenderness.   Neurological: She is alert and oriented to person, place, and time. She exhibits normal muscle tone. Coordination normal.  Skin: Skin is warm and dry.    ED Course  Procedures (including critical care time) Labs Review Labs Reviewed  URINALYSIS, ROUTINE W REFLEX MICROSCOPIC - Abnormal; Notable for the following:    APPearance TURBID (*)    Hgb urine dipstick LARGE (*)    Protein, ur 100 (*)    Nitrite POSITIVE (*)    Leukocytes, UA LARGE (*)    All other components within normal limits  URINE MICROSCOPIC-ADD ON - Abnormal; Notable for the following:    Bacteria, UA MANY (*)    All other components within normal limits  URINE CULTURE  PREGNANCY, URINE    Imaging Review No results found.   EKG Interpretation None      Urine culture pending.  MDM   Final diagnoses:  UTI (lower urinary tract infection)   Pt with hx of recurrent UTI's.  No CVA tenderness, vomiting, fever or chills to suggest pyelonephritis. No previous hx of kidney stones.  Pt is well appearing and vitals stable.  She agrees to keflex and pyridium and close f/u with her PMD.  She appears stable for d/c and advised to return for any worsening symtpoms   Nihal Marzella L. Trisha Mangleriplett, PA-C 10/01/13 1442

## 2013-10-02 ENCOUNTER — Telehealth (HOSPITAL_BASED_OUTPATIENT_CLINIC_OR_DEPARTMENT_OTHER): Payer: Self-pay

## 2013-10-02 NOTE — ED Provider Notes (Signed)
Medical screening examination/treatment/procedure(s) were performed by non-physician practitioner and as supervising physician I was immediately available for consultation/collaboration.   EKG Interpretation None        Shelda JakesScott W. Tyreka Henneke, MD 10/02/13 [phone]

## 2013-10-03 NOTE — Telephone Encounter (Signed)
Post ED Visit - Positive Culture Follow-up  Culture report reviewed by antimicrobial stewardship pharmacist: []  Wes Dulaney, Pharm.D., BCPS []  Celedonio MiyamotoJeremy Frens, Pharm.D., BCPS []  Georgina PillionElizabeth Martin, Pharm.D., BCPS []  BlackshearMinh Pham, VermontPharm.D., BCPS, AAHIVP []  Estella HuskMichelle Turner, Pharm.D., BCPS, AAHIVP [x]  Harvie JuniorNathan Cope, Pharm.D.  Positive urine culture Treated with Keflex, organism sensitive to the same and no further patient follow-up is required at this time.  Zeb ComfortKylie Aixa Corsello 10/03/2013, 12:35 PM

## 2013-10-11 ENCOUNTER — Telehealth: Payer: Self-pay

## 2013-10-11 ENCOUNTER — Ambulatory Visit: Payer: Medicaid Other | Admitting: Medical

## 2013-10-11 NOTE — Telephone Encounter (Signed)
Patient missed today's appointment. Missed last PP appointment as well. Patient is now 3 months PP. Called pt. No answer. Left message stating if you would like to reschedule your appointment please call clinic.

## 2014-04-09 ENCOUNTER — Encounter (HOSPITAL_BASED_OUTPATIENT_CLINIC_OR_DEPARTMENT_OTHER): Payer: Self-pay | Admitting: Emergency Medicine

## 2014-06-08 NOTE — L&D Delivery Note (Signed)
Delivery Note At 3:34 PM a healthy female was delivered via Vaginal, Spontaneous Delivery (Presentation: ROA).  APGAR: 9, 9; weight 4 lb 4.8 oz (1949 g).   Placenta status: retained, see note below. Cord: 3-vessel, manually extracted.  Anesthesia: Epidural  Episiotomy: None Lacerations:  None Suture Repair: N/A Est. Blood Loss (mL): 250  Mom to postpartum.  Baby to NICU.  Carrie Bean is a 29 y.o. female [phone] 1579G5P3205 with IUP at 466w0d admitted for chronic abruption then PPROM while inpatient.  Labor was induced at 34 weeks for PPROM.  She progressed to complete and pushed ~10 minutes to deliver.  Infant initially with slow transition so cord clamped by CNM, cut by FOB and infant taken to warmer to NICU team who were present for delivery.  Placenta delayed by more than 30 minutes.  Delivered small piece/lobe of placenta disconnected from rest of placenta, so difficult to apply cord traction.  CNM attempt at manual extraction unsuccessful so Dr Penne LashLeggett called to room.  Placenta manually extracted in room, then curette used to remove placental fragments. Pt tolerated well with epidural.  Perineum intact with minimal bleeding. Infant to NICU r/t gestational age.  Mom stable prior to transfer to postpartum. She plans on breastfeeding. She requests BTL for birth control.  LEFTWICH-KIRBY, LISA 04/23/2015, 4:19 PM

## 2014-10-04 ENCOUNTER — Emergency Department (HOSPITAL_BASED_OUTPATIENT_CLINIC_OR_DEPARTMENT_OTHER)
Admission: EM | Admit: 2014-10-04 | Discharge: 2014-10-04 | Disposition: A | Discharge status: Home / Self Care | Payer: Medicaid Other | Attending: Emergency Medicine | Admitting: Emergency Medicine

## 2014-10-04 ENCOUNTER — Encounter (HOSPITAL_BASED_OUTPATIENT_CLINIC_OR_DEPARTMENT_OTHER): Payer: Self-pay | Admitting: *Deleted

## 2014-10-04 ENCOUNTER — Emergency Department (HOSPITAL_BASED_OUTPATIENT_CLINIC_OR_DEPARTMENT_OTHER): Payer: Medicaid Other

## 2014-10-04 DIAGNOSIS — Z8619 Personal history of other infectious and parasitic diseases: Secondary | ICD-10-CM | POA: Diagnosis not present

## 2014-10-04 DIAGNOSIS — Z3A01 Less than 8 weeks gestation of pregnancy: Secondary | ICD-10-CM | POA: Diagnosis not present

## 2014-10-04 DIAGNOSIS — O23591 Infection of other part of genital tract in pregnancy, first trimester: Secondary | ICD-10-CM | POA: Insufficient documentation

## 2014-10-04 DIAGNOSIS — N760 Acute vaginitis: Secondary | ICD-10-CM

## 2014-10-04 DIAGNOSIS — Z79899 Other long term (current) drug therapy: Secondary | ICD-10-CM | POA: Insufficient documentation

## 2014-10-04 DIAGNOSIS — Z87440 Personal history of urinary (tract) infections: Secondary | ICD-10-CM | POA: Insufficient documentation

## 2014-10-04 DIAGNOSIS — N939 Abnormal uterine and vaginal bleeding, unspecified: Secondary | ICD-10-CM

## 2014-10-04 DIAGNOSIS — O200 Threatened abortion: Secondary | ICD-10-CM

## 2014-10-04 DIAGNOSIS — O4691 Antepartum hemorrhage, unspecified, first trimester: Secondary | ICD-10-CM | POA: Diagnosis present

## 2014-10-04 DIAGNOSIS — B9689 Other specified bacterial agents as the cause of diseases classified elsewhere: Secondary | ICD-10-CM

## 2014-10-04 LAB — URINALYSIS, ROUTINE W REFLEX MICROSCOPIC
BILIRUBIN URINE: NEGATIVE
GLUCOSE, UA: NEGATIVE mg/dL
HGB URINE DIPSTICK: NEGATIVE
Ketones, ur: 15 mg/dL — AB
NITRITE: NEGATIVE
PH: 6.5 (ref 5.0–8.0)
Protein, ur: NEGATIVE mg/dL
SPECIFIC GRAVITY, URINE: 1.029 (ref 1.005–1.030)
Urobilinogen, UA: 1 mg/dL (ref 0.0–1.0)

## 2014-10-04 LAB — HCG, QUANTITATIVE, PREGNANCY: HCG, BETA CHAIN, QUANT, S: 5357 m[IU]/mL — AB (ref <5)

## 2014-10-04 LAB — URINE MICROSCOPIC-ADD ON

## 2014-10-04 LAB — WET PREP, GENITAL
TRICH WET PREP: NONE SEEN
Yeast Wet Prep HPF POC: NONE SEEN

## 2014-10-04 LAB — PREGNANCY, URINE: Preg Test, Ur: POSITIVE — AB

## 2014-10-04 MED ORDER — METRONIDAZOLE 500 MG PO TABS: 500.0000 mg | ORAL_TABLET | Freq: Two times a day (BID) | ORAL | Status: DC

## 2014-10-04 MED ORDER — PRENATAL COMPLETE 14-0.4 MG PO TABS: 14.0000 mg | ORAL_TABLET | Freq: Every day | ORAL | Status: DC

## 2014-10-04 NOTE — ED Provider Notes (Signed)
CSN: [id number]     Arrival date & time 10/04/14  1520 History   First MD Initiated Contact with Patient 10/04/14 1525     Chief Complaint  Patient presents with  . Possible Pregnancy     (Consider location/radiation/quality/duration/timing/severity/associated sxs/prior Treatment) HPI Comments: 29 year old G5 P4 female complaining of vaginal spotting and lower abdominal cramping 2 days. She reports she took a home pregnancy test yesterday which was positive. LMP began March 22 and lasted 7 days. She is sexually active with her "baby daddy", however one month ago had intercourse with a new partner. Reports using protection. Not on birth control. States yesterday, she noticed slight pink in her underpants which continued into today. Lower abdominal pain described more as cramping, 6/10, intermittent. Nothing in specific makes her pain come and go. Denies fever or chills. Admits to nausea without vomiting.  Patient is a 29 y.o. female presenting with pregnancy problem. The history is provided by the patient.  Possible Pregnancy Associated symptoms include abdominal pain and nausea.    Past Medical History  Diagnosis Date  . Preterm labor   . Urinary tract infection   . Gonorrhea   . Chlamydia   . Abnormal Pap smear     f/u was normal   Past Surgical History  Procedure Laterality Date  . No past surgeries     Family History  Problem Relation Age of Onset  . Other Neg Hx   . Hearing loss Neg Hx    History  Substance Use Topics  . Smoking status: Never Smoker   . Smokeless tobacco: Never Used  . Alcohol Use: No   OB History    Gravida Para Term Preterm AB TAB SAB Ectopic Multiple Living   0 0 0 0 0 4     Review of Systems  Gastrointestinal: Positive for nausea and abdominal pain.  Genitourinary: Positive for vaginal bleeding (spotting).  All other systems reviewed and are negative.     Allergies  Review of patient's allergies indicates no known  allergies.  Home Medications   Prior to Admission medications   Medication Sig Start Date End Date Taking? Authorizing Provider  Prenatal Vit-Fe Fumarate-FA (PRENATAL COMPLETE) 14-0.4 MG TABS Take 14 mg by mouth daily. 10/04/14   Kathrynn Speed, PA-C  Prenatal Vit-Fe Fumarate-FA (PRENATAL MULTIVITAMIN) TABS Take 1 tablet by mouth daily.    Historical Provider, MD   BP 112/73 mmHg  Pulse 74  Temp(Src) 98.6 F (37 C) (Oral)  Resp 22  Ht  (1.778 m)  Wt 135 lb (61.236 kg)  BMI 19.37 kg/m2  SpO2 99%  LMP 08/28/2014 Physical Exam  Constitutional: She is oriented to person, place, and time. She appears well-developed and well-nourished. No distress.  HENT:  Head: Normocephalic and atraumatic.  Mouth/Throat: Oropharynx is clear and moist.  Eyes: Conjunctivae and EOM are normal.  Neck: Normal range of motion. Neck supple.  Cardiovascular: Normal rate, regular rhythm and normal heart sounds.   Pulmonary/Chest: Effort normal and breath sounds normal. No respiratory distress.  Abdominal: Soft. Bowel sounds are normal. She exhibits no distension. There is no tenderness. There is no rebound and no guarding.  Genitourinary: Uterus normal. Cervix exhibits no motion tenderness, no discharge and no friability. Right adnexum displays no tenderness. Left adnexum displays no tenderness. No bleeding in the vagina. Vaginal discharge (malodorous, thick, white) found.  Cervical os closed.  Musculoskeletal: Normal range of motion. She exhibits no edema.  Neurological: She is alert  and oriented to person, place, and time. No sensory deficit.  Skin: Skin is warm and dry.  Psychiatric: She has a normal mood and affect. Her behavior is normal.  Nursing note and vitals reviewed.   ED Course  Procedures (including critical care time) Labs Review Labs Reviewed  WET PREP, GENITAL - Abnormal; Notable for the following:    Clue Cells Wet Prep HPF POC MODERATE (*)    WBC, Wet Prep HPF POC FEW (*)    All  other components within normal limits  URINALYSIS, ROUTINE W REFLEX MICROSCOPIC - Abnormal; Notable for the following:    Ketones, ur 15 (*)    Leukocytes, UA SMALL (*)    All other components within normal limits  PREGNANCY, URINE - Abnormal; Notable for the following:    Preg Test, Ur POSITIVE (*)    All other components within normal limits  HCG, QUANTITATIVE, PREGNANCY - Abnormal; Notable for the following:    hCG, Beta Chain, Quant, S 5357 (*)    All other components within normal limits  URINE CULTURE  URINE MICROSCOPIC-ADD ON  GC/CHLAMYDIA PROBE AMP ([place])    Imaging Review US Ob Comp Less 14 Wks  10/04/2014   CLINICAL DATA:  Vaginal bleeding and cramping  EXAM: OBSTETRIC <14 WK [place] AND TRANSVAGINAL OB US  TECHNIQUE: Both transabdominal and transvaginal ultrasound examinations were performed for complete evaluation of the gestation as well as the maternal uterus, adnexal regions, and pelvic cul-de-sac. Transvaginal technique was performed to assess early pregnancy.  COMPARISON:  None.  FINDINGS: Intrauterine gestational sac: Single  Yolk sac:  Not identified  Embryo:  Not identified  Cardiac Activity: Not identified  MSD: 7  mm   6 w   4  d  [facility] 05/26/2015  Maternal uterus/adnexae: Corpus luteal cyst of the right ovary. Left image normal so. No free fluid. Trace free fluid.  IMPRESSION: Probable early intrauterine gestational sac, but no yolk sac, fetal pole, or cardiac activity yet visualized. Recommend follow-up quantitative B-HCG levels and follow-up US in 14 days to confirm and assess viability. This recommendation follows SRU consensus guidelines: Diagnostic Criteria for Nonviable Pregnancy Early in the First Trimester. Malva Limes Med 2013; 045:4098-11.   Electronically Signed   By: Genevive Bi M.D.   On: 10/04/2014 18:19   US Ob Transvaginal  10/04/2014   CLINICAL DATA:  Vaginal bleeding and cramping  EXAM: OBSTETRIC <14 WK [place] AND TRANSVAGINAL OB US  TECHNIQUE: Both  transabdominal and transvaginal ultrasound examinations were performed for complete evaluation of the gestation as well as the maternal uterus, adnexal regions, and pelvic cul-de-sac. Transvaginal technique was performed to assess early pregnancy.  COMPARISON:  None.  FINDINGS: Intrauterine gestational sac: Single  Yolk sac:  Not identified  Embryo:  Not identified  Cardiac Activity: Not identified  MSD: 7  mm   6 w   4  d  [facility] 05/26/2015  Maternal uterus/adnexae: Corpus luteal cyst of the right ovary. Left image normal so. No free fluid. Trace free fluid.  IMPRESSION: Probable early intrauterine gestational sac, but no yolk sac, fetal pole, or cardiac activity yet visualized. Recommend follow-up quantitative B-HCG levels and follow-up US in 14 days to confirm and assess viability. This recommendation follows SRU consensus guidelines: Diagnostic Criteria for Nonviable Pregnancy Early in the First Trimester. Malva Limes Med 2013; 914:7829-56.   Electronically Signed   By: Genevive Bi M.D.   On: 10/04/2014 18:19     EKG Interpretation None  MDM   Final diagnoses:  Threatened miscarriage in early pregnancy  BV (bacterial vaginosis)   Nontoxic appearing, NAD. AF VSS. Abdomen soft and nontender. No CMT or adnexal tenderness. Doubt PID, TOA. Treat BV with Flagyl. Risk of infection towards pregnancy outweighs risk of not treating. Given vaginal bleeding with positive pregnancy, ultrasound obtained. Probable early intrauterine gestational sac, but no yolk sac, fetal pole or cardiac activity yet visualized. Beta hCG 5357. [facility]. I advised her to follow-up with OB/GYN. Start prenatal vitamins. Stable for discharge. Return precautions given. Patient states understanding of treatment care plan and is agreeable.  Kathrynn SpeedRobyn M Debbi Strandberg, PA-C 10/04/14 1833  Nelva Nayobert Beaton, MD 10/04/14 [phone]

## 2014-10-04 NOTE — Discharge Instructions (Signed)
Take prenatal vitamins daily. Take Flagyl twice daily for 1 week for bacterial vaginosis.  Threatened Miscarriage A threatened miscarriage occurs when you have vaginal bleeding during your first 20 weeks of pregnancy but the pregnancy has not ended. If you have vaginal bleeding during this time, your health care provider will do tests to make sure you are still pregnant. If the tests show you are still pregnant and the developing baby (fetus) inside your womb (uterus) is still growing, your condition is considered a threatened miscarriage. A threatened miscarriage does not mean your pregnancy will end, but it does increase the risk of losing your pregnancy (complete miscarriage). CAUSES  The cause of a threatened miscarriage is usually not known. If you go on to have a complete miscarriage, the most common cause is an abnormal number of chromosomes in the developing baby. Chromosomes are the structures inside cells that hold all your genetic material. Some causes of vaginal bleeding that do not result in miscarriage include:  Having sex.  Having an infection.  Normal hormone changes of pregnancy.  Bleeding that occurs when an egg implants in your uterus. RISK FACTORS Risk factors for bleeding in early pregnancy include:  Obesity.  Smoking.  Drinking excessive amounts of alcohol or caffeine.  Recreational drug use. SIGNS AND SYMPTOMS  Light vaginal bleeding.  Mild abdominal pain or cramps. DIAGNOSIS  If you have bleeding with or without abdominal pain before 20 weeks of pregnancy, your health care provider will do tests to check whether you are still pregnant. One important test involves using sound waves and a computer (ultrasound) to create images of the inside of your uterus. Other tests include an internal exam of your vagina and uterus (pelvic exam) and measurement of your baby's heart rate.  You may be diagnosed with a threatened miscarriage if:  Ultrasound testing shows you  are still pregnant.  Your baby's heart rate is strong.  A pelvic exam shows that the opening between your uterus and your vagina (cervix) is closed.  Your heart rate and blood pressure are stable.  Blood tests confirm you are still pregnant. TREATMENT  No treatments have been shown to prevent a threatened miscarriage from going on to a complete miscarriage. However, the right home care is important.  HOME CARE INSTRUCTIONS   Make sure you keep all your appointments for prenatal care. This is very important.  Get plenty of rest.  Do not have sex or use tampons if you have vaginal bleeding.  Do not douche.  Do not smoke or use recreational drugs.  Do not drink alcohol.  Avoid caffeine. SEEK MEDICAL CARE IF:  You have light vaginal bleeding or spotting while pregnant.  You have abdominal pain or cramping.  You have a fever. SEEK IMMEDIATE MEDICAL CARE IF:  You have heavy vaginal bleeding.  You have blood clots coming from your vagina.  You have severe low back pain or abdominal cramps.  You have fever, chills, and severe abdominal pain. MAKE SURE YOU:  Understand these instructions.  Will watch your condition.  Will get help right away if you are not doing well or get worse. Document Released: 05/25/2005 Document Revised: 05/30/2013 Document Reviewed: 03/21/2013 [facility] Patient Information 2015 [place]. This information is not intended to replace advice given to you by your health care provider. Make sure you discuss any questions you have with your health care provider.  Bacterial Vaginosis Bacterial vaginosis is a vaginal infection that occurs when the normal balance of bacteria in  the vagina is disrupted. It results from an overgrowth of certain bacteria. This is the most common vaginal infection in women of childbearing age. Treatment is important to prevent complications, especially in pregnant women, as it can cause a premature delivery. CAUSES    Bacterial vaginosis is caused by an increase in harmful bacteria that are normally present in smaller amounts in the vagina. Several different kinds of bacteria can cause bacterial vaginosis. However, the reason that the condition develops is not fully understood. RISK FACTORS Certain activities or behaviors can put you at an increased risk of developing bacterial vaginosis, including:  Having a new sex partner or multiple sex partners.  Douching.  Using an intrauterine device (IUD) for contraception. Women do not get bacterial vaginosis from toilet seats, bedding, swimming pools, or contact with objects around them. SIGNS AND SYMPTOMS  Some women with bacterial vaginosis have no signs or symptoms. Common symptoms include:  Grey vaginal discharge.  A fishlike odor with discharge, especially after sexual intercourse.  Itching or burning of the vagina and vulva.  Burning or pain with urination. DIAGNOSIS  Your health care provider will take a medical history and examine the vagina for signs of bacterial vaginosis. A sample of vaginal fluid may be taken. Your health care provider will look at this sample under a microscope to check for bacteria and abnormal cells. A vaginal pH test may also be done.  TREATMENT  Bacterial vaginosis may be treated with antibiotic medicines. These may be given in the form of a pill or a vaginal cream. A second round of antibiotics may be prescribed if the condition comes back after treatment.  HOME CARE INSTRUCTIONS   Only take over-the-counter or prescription medicines as directed by your health care provider.  If antibiotic medicine was prescribed, take it as directed. Make sure you finish it even if you start to feel better.  Do not have sex until treatment is completed.  Tell all sexual partners that you have a vaginal infection. They should see their health care provider and be treated if they have problems, such as a mild rash or  itching.  Practice safe sex by using condoms and only having one sex partner. SEEK MEDICAL CARE IF:   Your symptoms are not improving after 3 days of treatment.  You have increased discharge or pain.  You have a fever. MAKE SURE YOU:   Understand these instructions.  Will watch your condition.  Will get help right away if you are not doing well or get worse. FOR MORE INFORMATION  Centers for Disease Control and Prevention, Division of STD Prevention: [employer].za American Sexual Health Association (ASHA): www.ashastd.org  Document Released: 05/25/2005 Document Revised: 03/15/2013 Document Reviewed: 01/04/2013 [facility] Patient Information 2015 [place], [place]. This information is not intended to replace advice given to you by your health care provider. Make sure you discuss any questions you have with your health care provider.

## 2014-10-04 NOTE — ED Notes (Signed)
Pelvic cart at bedside. 

## 2014-10-04 NOTE — ED Notes (Signed)
Pregnant.  LMP 1 month ago.  Reports spotting since yesterday.

## 2014-10-05 LAB — GC/CHLAMYDIA PROBE AMP (~~LOC~~) NOT AT ARMC
CHLAMYDIA, DNA PROBE: NEGATIVE
NEISSERIA GONORRHEA: POSITIVE — AB

## 2014-10-06 LAB — URINE CULTURE: Colony Count: 6000

## 2014-10-08 ENCOUNTER — Telehealth (HOSPITAL_COMMUNITY): Payer: Self-pay

## 2014-10-08 NOTE — ED Notes (Signed)
Positive for Gonorrhea. Chart sent to EDP office for review.

## 2014-10-10 ENCOUNTER — Telehealth (HOSPITAL_BASED_OUTPATIENT_CLINIC_OR_DEPARTMENT_OTHER): Payer: Self-pay | Admitting: Emergency Medicine

## 2014-10-11 ENCOUNTER — Telehealth: Payer: Self-pay | Admitting: Emergency Medicine

## 2014-10-11 ENCOUNTER — Other Ambulatory Visit: Payer: Medicaid Other

## 2014-10-11 DIAGNOSIS — O209 Hemorrhage in early pregnancy, unspecified: Secondary | ICD-10-CM

## 2014-10-11 LAB — HCG, QUANTITATIVE, PREGNANCY: hCG, Beta Chain, Quant, S: 28451 m[IU]/mL

## 2014-10-11 NOTE — Telephone Encounter (Signed)
Post ED Visit - Positive Culture Follow-up: Successful Patient Follow-Up  Positive Gonorrhea culture  [x]  Patient discharged without antimicrobial prescription and treatment is now indicated []  Organism is resistant to prescribed ED discharge antimicrobial []  Patient with positive blood cultures  Changes discussed with ED provider: Eyvonne MechanicJeffrey Hedges, [place] New antibiotic prescription: Suprax 400 mg PO x once, Aztihromycin one gram PO x once [phone]  Contacted patient, date 10/11/14, time 0900 ID Verfied. pt notifed of positive gonorrhea and need for treatment. STD instructions given, patient verbalized understanding. [phone] voicemail.   Carrie Bean, Carrie Bean 10/11/2014, 9:13 AM

## 2014-10-12 ENCOUNTER — Telehealth: Payer: Self-pay | Admitting: General Practice

## 2014-10-12 DIAGNOSIS — Z3490 Encounter for supervision of normal pregnancy, unspecified, unspecified trimester: Secondary | ICD-10-CM

## 2014-10-12 NOTE — Telephone Encounter (Signed)
Dr Jolayne Pantherconstant reviewed patient's bhcg. Ordered follow up ultrasound in 10 days. Called patient and informed her of results and need for follow up ultrasound late next week and asked what appt time she prefers. Patient verbalized understanding and states morning appt. Called ultrasound and scheduled appt for 10/19/14 at 0945. Patient informed and advised to have a full bladder. Denies any questions or concerns.

## 2014-10-19 ENCOUNTER — Ambulatory Visit (HOSPITAL_COMMUNITY): Payer: Medicaid Other | Attending: Obstetrics and Gynecology

## 2014-10-25 ENCOUNTER — Ambulatory Visit (HOSPITAL_COMMUNITY)
Admission: RE | Admit: 2014-10-25 | Discharge: 2014-10-25 | Disposition: A | Discharge status: Home / Self Care | Payer: Medicaid Other | Source: Ambulatory Visit | Attending: Obstetrics and Gynecology | Admitting: Obstetrics and Gynecology

## 2014-10-25 DIAGNOSIS — Z3A08 8 weeks gestation of pregnancy: Secondary | ICD-10-CM | POA: Insufficient documentation

## 2014-10-25 DIAGNOSIS — Z3490 Encounter for supervision of normal pregnancy, unspecified, unspecified trimester: Secondary | ICD-10-CM

## 2014-10-25 DIAGNOSIS — Z3491 Encounter for supervision of normal pregnancy, unspecified, first trimester: Secondary | ICD-10-CM | POA: Diagnosis not present

## 2014-10-29 ENCOUNTER — Telehealth: Payer: Self-pay | Admitting: *Deleted

## 2014-10-29 ENCOUNTER — Encounter: Payer: Self-pay | Admitting: *Deleted

## 2014-10-29 NOTE — Telephone Encounter (Signed)
Carrie Bean left a message she had an ultrasound last Thursday and was supposed to get a call back.  Per chart another nurse attempted to call patient at another number , but voice mail full for that number.   Called patient at number she left today and left a message we are returning your call. Please call us back tomorrow and if we may leave detailed information on voicemail, please leave a message stating that.

## 2014-10-29 NOTE — Telephone Encounter (Signed)
Dr. Jolayne Pantheronstant informed the clinic that patient has viable pregnancy and we can initiate care.  Attempted to contact patient with results, no answer, mailbox is full.  Will send letter.

## 2014-10-30 ENCOUNTER — Encounter: Payer: Self-pay | Admitting: Obstetrics & Gynecology

## 2014-10-30 NOTE — Telephone Encounter (Signed)
Called patient and informed her of normal ultrasound results and dating. Patient verbalized understanding and asked about next steps. Told patient she can go ahead and start care somewhere. Patient expressed interest in our office and I informed her of new OB appt if she would like that on 6/7. Patient verbalized understanding and had no other questions

## 2014-11-13 ENCOUNTER — Encounter: Payer: Medicaid Other | Admitting: Obstetrics & Gynecology

## 2014-11-13 ENCOUNTER — Encounter: Payer: Self-pay | Admitting: Obstetrics & Gynecology

## 2014-12-04 ENCOUNTER — Encounter: Payer: Self-pay | Admitting: General Practice

## 2015-01-03 ENCOUNTER — Inpatient Hospital Stay (HOSPITAL_COMMUNITY)
Admission: AD | Admit: 2015-01-03 | Discharge: 2015-01-04 | Disposition: A | Discharge status: Home / Self Care | Payer: Medicaid Other | Source: Ambulatory Visit | Attending: Family Medicine | Admitting: Family Medicine

## 2015-01-03 ENCOUNTER — Inpatient Hospital Stay (HOSPITAL_COMMUNITY): Payer: Medicaid Other

## 2015-01-03 ENCOUNTER — Encounter (HOSPITAL_COMMUNITY): Payer: Self-pay | Admitting: *Deleted

## 2015-01-03 DIAGNOSIS — Z3A18 18 weeks gestation of pregnancy: Secondary | ICD-10-CM | POA: Insufficient documentation

## 2015-01-03 DIAGNOSIS — O208 Other hemorrhage in early pregnancy: Secondary | ICD-10-CM | POA: Insufficient documentation

## 2015-01-03 DIAGNOSIS — O43892 Other placental disorders, second trimester: Secondary | ICD-10-CM

## 2015-01-03 DIAGNOSIS — O209 Hemorrhage in early pregnancy, unspecified: Secondary | ICD-10-CM | POA: Diagnosis present

## 2015-01-03 DIAGNOSIS — O4690 Antepartum hemorrhage, unspecified, unspecified trimester: Secondary | ICD-10-CM

## 2015-01-03 LAB — URINALYSIS, ROUTINE W REFLEX MICROSCOPIC
BILIRUBIN URINE: NEGATIVE
Glucose, UA: NEGATIVE mg/dL
Ketones, ur: NEGATIVE mg/dL
LEUKOCYTES UA: NEGATIVE
Nitrite: NEGATIVE
Protein, ur: NEGATIVE mg/dL
Specific Gravity, Urine: 1.025 (ref 1.005–1.030)
Urobilinogen, UA: 0.2 mg/dL (ref 0.0–1.0)
pH: 6.5 (ref 5.0–8.0)

## 2015-01-03 LAB — WET PREP, GENITAL
CLUE CELLS WET PREP: NONE SEEN
TRICH WET PREP: NONE SEEN
Yeast Wet Prep HPF POC: NONE SEEN

## 2015-01-03 LAB — CBC
HCT: 34.3 % — ABNORMAL LOW (ref 36.0–46.0)
Hemoglobin: 11 g/dL — ABNORMAL LOW (ref 12.0–15.0)
MCH: 25.6 pg — ABNORMAL LOW (ref 26.0–34.0)
MCHC: 32.1 g/dL (ref 30.0–36.0)
MCV: 80 fL (ref 78.0–100.0)
PLATELETS: 225 10*3/uL (ref 150–400)
RBC: 4.29 MIL/uL (ref 3.87–5.11)
RDW: 14.6 % (ref 11.5–15.5)
WBC: 8 10*3/uL (ref 4.0–10.5)

## 2015-01-03 LAB — URINE MICROSCOPIC-ADD ON

## 2015-01-03 LAB — OB RESULTS CONSOLE GC/CHLAMYDIA: Gonorrhea: NEGATIVE

## 2015-01-03 NOTE — MAU Note (Signed)
Pt reports she has been bleeding off/on for one month, [place] an [place] of heavy bleeding and everything was okay (july 10th). Lower abd cramping today and the brownish discharge is much heavier.

## 2015-01-03 NOTE — MAU Provider Note (Signed)
History     CSN: [id number]  Arrival date and time: 01/03/15 2122   First Provider Initiated Contact with Patient 01/03/15 2222      No chief complaint on file.  HPI Comments: Carrie Bean is a 29 y.o. [id number] at [redacted]w[redacted]d who presents today with vaginal bleeding. She states that she has been bleeding for about a month. She had [place] US [place] beach on [place]14, and was told "everything was normal". About two weeks ago the bleeding changed to brown, but now she is having lower abdominal pain.   Vaginal Bleeding The patient's primary symptoms include vaginal bleeding. This is a new problem. The current episode started more than 1 month ago. The problem occurs intermittently. The problem has been unchanged. The pain is moderate (7/10). She is pregnant. Associated symptoms include abdominal pain. Pertinent negatives include no constipation, diarrhea, dysuria, fever, frequency, nausea, urgency or vomiting. The vaginal discharge was brown. The vaginal bleeding is lighter than menses. She has not been passing clots. She has not been passing tissue. Nothing aggravates the symptoms. She has tried nothing for the symptoms.   Past Medical History  Diagnosis Date  . Preterm labor   . Urinary tract infection   . Gonorrhea   . Chlamydia   . Abnormal Pap smear     f/u was normal    Past Surgical History  Procedure Laterality Date  . No past surgeries      Family History  Problem Relation Age of Onset  . Other Neg Hx   . Hearing loss Neg Hx     History  Substance Use Topics  . Smoking status: Never Smoker   . Smokeless tobacco: Never Used  . Alcohol Use: No    Allergies: No Known Allergies  Prescriptions prior to admission  Medication Sig Dispense Refill Last Dose  . metroNIDAZOLE (FLAGYL) 500 MG tablet Take 1 tablet (500 mg total) by mouth 2 (two) times daily. One po bid x 7 days (Patient not taking: Reported on 01/03/2015) 14 tablet 0 Completed Course at Unknown time  .  Prenatal Vit-Fe Fumarate-FA (PRENATAL COMPLETE) 14-0.4 MG TABS Take 14 mg by mouth daily. (Patient not taking: Reported on 01/03/2015) 60 each 0 Not Taking at Unknown time    Review of Systems  Constitutional: Negative for fever.  Gastrointestinal: Positive for abdominal pain. Negative for nausea, vomiting, diarrhea and constipation.  Genitourinary: Positive for vaginal bleeding. Negative for dysuria, urgency and frequency.   Physical Exam   Blood pressure 116/64, pulse 89, temperature 98.4 F (36.9 C), temperature source Oral, resp. rate 16, height  (1.778 m), weight 66.225 kg (146 lb), last menstrual period 08/28/2014, SpO2 100 %, currently breastfeeding.  Physical Exam  Nursing note and vitals reviewed. Constitutional: She is oriented to person, place, and time. She appears well-developed and well-nourished. No distress.  HENT:  Head: Normocephalic.  Cardiovascular: Normal rate.   Respiratory: Effort normal.  GI: Soft. There is no tenderness.  Genitourinary:   External: no lesion Vagina: small amount of mostly brown blood seen  Cervix: pink, smooth, closed/thick/high  Uterus: AGA, FHT with doppler   Neurological: She is alert and oriented to person, place, and time.  Skin: Skin is warm and dry.  Psychiatric: She has a normal mood and affect.     Results for orders placed or performed during the hospital encounter of 01/03/15 (from the past 24 hour(s))  Urinalysis, Routine w reflex microscopic (not at [facility])  Status: Abnormal   Collection Time: 01/03/15  9:46 PM  Result Value Ref Range   Color, Urine YELLOW YELLOW   APPearance CLEAR CLEAR   Specific Gravity, Urine 1.025 1.005 - 1.030   pH 6.5 5.0 - 8.0   Glucose, UA NEGATIVE NEGATIVE mg/dL   Hgb urine dipstick SMALL (A) NEGATIVE   Bilirubin Urine NEGATIVE NEGATIVE   Ketones, ur NEGATIVE NEGATIVE mg/dL   Protein, ur NEGATIVE NEGATIVE mg/dL   Urobilinogen, UA 0.2 0.0 - 1.0 mg/dL   Nitrite NEGATIVE NEGATIVE    Leukocytes, UA NEGATIVE NEGATIVE  Urine microscopic-add on     Status: Abnormal   Collection Time: 01/03/15  9:46 PM  Result Value Ref Range   Squamous Epithelial / LPF RARE RARE   WBC, UA 3-6 <3 WBC/hpf   RBC / HPF 0-2 <3 RBC/hpf   Bacteria, UA FEW (A) RARE   Urine-Other MUCOUS PRESENT   CBC     Status: Abnormal   Collection Time: 01/03/15 10:26 PM  Result Value Ref Range   WBC 8.0 4.0 - 10.5 K/uL   RBC 4.29 3.87 - 5.11 MIL/uL   Hemoglobin 11.0 (L) 12.0 - 15.0 g/dL   HCT 09.8 (L) 11.9 - 14.7 %   MCV 80.0 78.0 - 100.0 fL   MCH 25.6 (L) 26.0 - 34.0 pg   MCHC 32.1 30.0 - 36.0 g/dL   RDW 82.9 56.2 - 13.0 %   Platelets 225 150 - 400 K/uL  Wet prep, genital     Status: Abnormal   Collection Time: 01/03/15 10:30 PM  Result Value Ref Range   Yeast Wet Prep HPF POC NONE SEEN NONE SEEN   Trich, Wet Prep NONE SEEN NONE SEEN   Clue Cells Wet Prep HPF POC NONE SEEN NONE SEEN   WBC, Wet Prep HPF POC MANY (A) NONE SEEN    US shows 3cm area of [facility] over the cervical os. No previa. Cervical length is 3.9cm MAU Course  Procedures  MDM 0013: D/W Dr. Shawnie Pons, ok for dc home.   Assessment and Plan   1. Subchorionic hematoma, second trimester   2. Vaginal bleeding in pregnancy    DC home Comfort measures reviewed  2nd Trimester precautions  Bleeding precautions Pelvic rest  PTL precautions  RX: none  Return to MAU as needed FU with OB as planned  Follow-up Information    Follow up with [facility].   Specialty:  Obstetrics and Gynecology   Why:  as scheduled for 01/14/15    Contact information:   [address] [place] [address] [phone]        [name] 01/03/2015, 10:28 PM

## 2015-01-04 DIAGNOSIS — O43892 Other placental disorders, second trimester: Secondary | ICD-10-CM

## 2015-01-04 LAB — GC/CHLAMYDIA PROBE AMP (~~LOC~~) NOT AT ARMC
Chlamydia: NEGATIVE
Neisseria Gonorrhea: NEGATIVE

## 2015-01-04 LAB — HIV ANTIBODY (ROUTINE TESTING W REFLEX): HIV Screen 4th Generation wRfx: NONREACTIVE

## 2015-01-04 NOTE — Discharge Instructions (Signed)
Pelvic Rest Pelvic rest is sometimes recommended for women when:   The placenta is partially or completely covering the opening of the cervix (placenta previa).  There is bleeding between the uterine wall and the amniotic sac. (subchorionic hemorrhage).  The cervix begins to open without labor starting (incompetent cervix, cervical insufficiency).  The labor is too early (preterm labor). HOME CARE INSTRUCTIONS  Do not have sexual intercourse, stimulation, or an orgasm.  Do not use tampons, douche, or put anything in the vagina.  Do not lift anything over 10 pounds (4.5 kg).  Avoid strenuous activity or straining your pelvic muscles. SEEK MEDICAL CARE IF:  You have any vaginal bleeding during pregnancy. Treat this as a potential emergency.  You have cramping pain felt low in the stomach (stronger than menstrual cramps).  You notice vaginal discharge (watery, mucus, or bloody).  You have a low, dull backache.  There are regular contractions or uterine tightening. SEEK IMMEDIATE MEDICAL CARE IF: You have vaginal bleeding and have placenta previa.  Document Released: 09/19/2010 Document Revised: 08/17/2011 Document Reviewed: 09/19/2010 [facility] Patient Information 2015 [place], [place]. This information is not intended to replace advice given to you by your health care provider. Make sure you discuss any questions you have with your health care provider.   Subchorionic Hematoma A subchorionic hematoma is a gathering of blood between the outer wall of the placenta and the inner wall of the womb (uterus). The placenta is the organ that connects the fetus to the wall of the uterus. The placenta performs the feeding, breathing (oxygen to the fetus), and waste removal (excretory work) of the fetus.  Subchorionic hematoma is the most common abnormality found on a result from ultrasonography done during the first trimester or early second trimester of pregnancy. If there has been little or  no vaginal bleeding, early small hematomas usually shrink on their own and do not affect your baby or pregnancy. The blood is gradually absorbed over 1-2 weeks. When bleeding starts later in pregnancy or the hematoma is larger or occurs in an older pregnant woman, the outcome may not be as good. Larger hematomas may get bigger, which increases the chances for miscarriage. Subchorionic hematoma also increases the risk of premature detachment of the placenta from the uterus, preterm (premature) labor, and stillbirth. HOME CARE INSTRUCTIONS  Stay on bed rest if your health care provider recommends this. Although bed rest will not prevent more bleeding or prevent a miscarriage, your health care provider may recommend bed rest until you are advised otherwise.  Avoid heavy lifting (more than 10 lb [4.5 kg]), exercise, sexual intercourse, or douching as directed by your health care provider.  Keep track of the number of pads you use each day and how soaked (saturated) they are. Write down this information.  Do not use tampons.  Keep all follow-up appointments as directed by your health care provider. Your health care provider may ask you to have follow-up blood tests or ultrasound tests or both. SEEK IMMEDIATE MEDICAL CARE IF:  You have severe cramps in your stomach, back, abdomen, or pelvis.  You have a fever.  You pass large clots or tissue. Save any tissue for your health care provider to look at.  Your bleeding increases or you become lightheaded, feel weak, or have fainting episodes. Document Released: 09/09/2006 Document Revised: 10/09/2013 Document Reviewed: 12/22/2012 [facility] Patient Information 2015 [place], [place]. This information is not intended to replace advice given to you by your health care provider. Make sure you  discuss any questions you have with your health care provider.

## 2015-01-14 ENCOUNTER — Ambulatory Visit (INDEPENDENT_AMBULATORY_CARE_PROVIDER_SITE_OTHER): Payer: Self-pay | Admitting: Family Medicine

## 2015-01-14 ENCOUNTER — Encounter: Payer: Self-pay | Admitting: Family Medicine

## 2015-01-14 VITALS — BP 122/70 | HR 83 | Temp 98.0°F | Wt 146.6 lb

## 2015-01-14 DIAGNOSIS — O0992 Supervision of high risk pregnancy, unspecified, second trimester: Secondary | ICD-10-CM

## 2015-01-14 DIAGNOSIS — Z113 Encounter for screening for infections with a predominantly sexual mode of transmission: Secondary | ICD-10-CM

## 2015-01-14 DIAGNOSIS — O09212 Supervision of pregnancy with history of pre-term labor, second trimester: Secondary | ICD-10-CM

## 2015-01-14 DIAGNOSIS — Z124 Encounter for screening for malignant neoplasm of cervix: Secondary | ICD-10-CM

## 2015-01-14 DIAGNOSIS — Z118 Encounter for screening for other infectious and parasitic diseases: Secondary | ICD-10-CM

## 2015-01-14 LAB — POCT URINALYSIS DIP (DEVICE)
BILIRUBIN URINE: NEGATIVE
GLUCOSE, UA: NEGATIVE mg/dL
KETONES UR: NEGATIVE mg/dL
NITRITE: NEGATIVE
PH: 6 (ref 5.0–8.0)
PROTEIN: NEGATIVE mg/dL
Urobilinogen, UA: 1 mg/dL (ref 0.0–1.0)

## 2015-01-14 LAB — OB RESULTS CONSOLE GBS: STREP GROUP B AG: NEGATIVE

## 2015-01-14 MED ORDER — PRENATAL COMPLETE 14-0.4 MG PO TABS: 14.0000 mg | ORAL_TABLET | Freq: Every day | ORAL | Status: DC

## 2015-01-14 MED ORDER — HYDROXYPROGESTERONE CAPROATE 250 MG/ML IM OIL
250.0000 mg | TOPICAL_OIL | INTRAMUSCULAR | Status: DC
  Administered 2015-01-24 – 2015-04-04 (×7): 250 mg via INTRAMUSCULAR

## 2015-01-14 NOTE — Progress Notes (Signed)
Nutrition Note: 1st visit.  Hx. Of preterm delivery. Pt reports 3 meals and 3-4 snacks/day.  No nausea or vomiting. Pt is not taking PNV currently.   Wt gain wnl.   Verbal and written general nutrition during pregnancy education given.  F/U as needed. Candice C. Earlene Plater, MPH, RD, LDN

## 2015-01-14 NOTE — Progress Notes (Signed)
   Subjective:    Carrie Bean is a [id number] [redacted]w[redacted]d being seen today for her first obstetrical visit.  Her obstetrical history is significant for h/o PTB on 17 P last 3 pregnancies. Patient does intend to breast feed. Pregnancy history fully reviewed.  Patient reports bleeding. Has previous [facility]. Also, reports back pain and pelvic pressure.  Filed Vitals:   01/14/15 0910  BP: 122/70  Pulse: 83  Temp: 98 F (36.7 C)  Weight: 146 lb 9.6 oz (66.497 kg)    HISTORY: OB History  Gravida Para Term Preterm AB SAB TAB Ectopic Multiple Living  0 0 0 0 0 4    # Outcome Date GA Lbr Len/2nd Weight Sex Delivery Anes PTL Lv  5 Current           4 Term 07/16/13 [redacted]w[redacted]d / 00:27 7 lb 4.9 oz (3.315 kg) Genella Mech EPI  Y  3 Term 03/01/12 [redacted]w[redacted]d 02:50 / 00:11 7 lb 15.2 oz (3.606 kg) F Vag-Spont EPI  Y     Comments: WNL  2 Term 12/28/08 [redacted]w[redacted]d 06:00 8 lb (3.629 kg) M Vag-Spont  N Y  1 Preterm 02/16/06 [redacted]w[redacted]d 07:00 6 lb 7 oz (2.92 kg) M Vag-Spont EPI Y Y     Comments: 36wks 6 days     Past Medical History  Diagnosis Date  . Preterm labor   . Urinary tract infection   . Gonorrhea   . Chlamydia   . Abnormal Pap smear     f/u was normal   Past Surgical History  Procedure Laterality Date  . No past surgeries     Family History  Problem Relation Age of Onset  . Other Neg Hx   . Hearing loss Neg Hx      Exam    Uterus:   21 cm  Pelvic Exam:    Perineum: Normal Perineum   Vulva: Bartholin's, Urethra, Skene's normal   Vagina:  normal mucosa, scant blood   Cervix: multiparous appearance, no cervical motion tenderness and no lesions   Adnexa: normal adnexa   Bony Pelvis: average  System: Breast:  normal appearance, no masses or tenderness   Skin: normal coloration and turgor, no rashes    Neurologic: normal mood   Extremities: normal strength, tone, and muscle mass, no erythema, induration, or nodules   HEENT extra ocular movement intact and sclera clear, anicteric   Mouth/Teeth mucous membranes moist, pharynx normal without lesions and dental hygiene good   Neck supple   Cardiovascular: regular rate and rhythm, no murmurs or gallops   Respiratory:  appears well, vitals normal, no respiratory distress, acyanotic, normal RR, ear and throat exam is normal, neck free of mass or lymphadenopathy, chest clear, no wheezing, crepitations, rhonchi, normal symmetric air entry   Abdomen: soft, non-tender; bowel sounds normal; no masses,  no organomegaly      Assessment:    Pregnancy: [id number] Patient Active Problem List   Diagnosis Date Noted  . Pregnancy with history of pre-term labor 04/21/2013  . Supervision of high-risk pregnancy 04/13/2013        Plan:     Initial labs drawn. Prenatal vitamins. Problem list reviewed and updated. Genetic Screening discussed Quad Screen: ordered. Ultrasound discussed; fetal survey: ordered. 17 P ordered Follow up in 3 weeks.    Araceli Arango S 01/14/2015

## 2015-01-14 NOTE — Patient Instructions (Signed)
Second Trimester of Pregnancy The second trimester is from week 13 through week 28, months 4 through 6. The second trimester is often a time when you feel your best. Your body has also adjusted to being pregnant, and you begin to feel better physically. Usually, morning sickness has lessened or quit completely, you may have more energy, and you may have an increase in appetite. The second trimester is also a time when the fetus is growing rapidly. At the end of the sixth month, the fetus is about 9 inches long and weighs about 1 pounds. You will likely begin to feel the baby move (quickening) between 18 and 20 weeks of the pregnancy. BODY CHANGES Your body goes through many changes during pregnancy. The changes vary from woman to woman.   Your weight will continue to increase. You will notice your lower abdomen bulging out.  You may begin to get stretch marks on your hips, abdomen, and breasts.  You may develop headaches that can be relieved by medicines approved by your health care provider.  You may urinate more often because the fetus is pressing on your bladder.  You may develop or continue to have heartburn as a result of your pregnancy.  You may develop constipation because certain hormones are causing the muscles that push waste through your intestines to slow down.  You may develop hemorrhoids or swollen, bulging veins (varicose veins).  You may have back pain because of the weight gain and pregnancy hormones relaxing your joints between the bones in your pelvis and as a result of a shift in weight and the muscles that support your balance.  Your breasts will continue to grow and be tender.  Your gums may bleed and may be sensitive to brushing and flossing.  Dark spots or blotches (chloasma, mask of pregnancy) may develop on your face. This will likely fade after the baby is born.  A dark line from your belly button to the pubic area (linea nigra) may appear. This will likely  fade after the baby is born.  You may have changes in your hair. These can include thickening of your hair, rapid growth, and changes in texture. Some women also have hair loss during or after pregnancy, or hair that feels dry or thin. Your hair will most likely return to normal after your baby is born. WHAT TO EXPECT AT YOUR PRENATAL VISITS During a routine prenatal visit:  You will be weighed to make sure you and the fetus are growing normally.  Your blood pressure will be taken.  Your abdomen will be measured to track your baby's growth.  The fetal heartbeat will be listened to.  Any test results from the previous visit will be discussed. Your health care provider may ask you:  How you are feeling.  If you are feeling the baby move.  If you have had any abnormal symptoms, such as leaking fluid, bleeding, severe headaches, or abdominal cramping.  If you have any questions. Other tests that may be performed during your second trimester include:  Blood tests that check for:  Low iron levels (anemia).  Gestational diabetes (between 24 and 28 weeks).  Rh antibodies.  Urine tests to check for infections, diabetes, or protein in the urine.  An ultrasound to confirm the proper growth and development of the baby.  An amniocentesis to check for possible genetic problems.  Fetal screens for spina bifida and Down syndrome. HOME CARE INSTRUCTIONS   Avoid all smoking, herbs, alcohol, and unprescribed   drugs. These chemicals affect the formation and growth of the baby.  Follow your health care provider's instructions regarding medicine use. There are medicines that are either safe or unsafe to take during pregnancy.  Exercise only as directed by your health care provider. Experiencing uterine cramps is a good sign to stop exercising.  Continue to eat regular, healthy meals.  Wear a good support bra for breast tenderness.  Do not use hot tubs, steam rooms, or saunas.  Wear  your seat belt at all times when driving.  Avoid raw meat, uncooked cheese, cat litter boxes, and soil used by cats. These carry germs that can cause birth defects in the baby.  Take your prenatal vitamins.  Try taking a stool softener (if your health care provider approves) if you develop constipation. Eat more high-fiber foods, such as fresh vegetables or fruit and whole grains. Drink plenty of fluids to keep your urine clear or pale yellow.  Take warm sitz baths to soothe any pain or discomfort caused by hemorrhoids. Use hemorrhoid cream if your health care provider approves.  If you develop varicose veins, wear support hose. Elevate your feet for 15 minutes, 3-4 times a day. Limit salt in your diet.  Avoid heavy lifting, wear low heel shoes, and practice good posture.  Rest with your legs elevated if you have leg cramps or low back pain.  Visit your dentist if you have not gone yet during your pregnancy. Use a soft toothbrush to brush your teeth and be gentle when you floss.  A sexual relationship may be continued unless your health care provider directs you otherwise.  Continue to go to all your prenatal visits as directed by your health care provider. SEEK MEDICAL CARE IF:   You have dizziness.  You have mild pelvic cramps, pelvic pressure, or nagging pain in the abdominal area.  You have persistent nausea, vomiting, or diarrhea.  You have a bad smelling vaginal discharge.  You have pain with urination. SEEK IMMEDIATE MEDICAL CARE IF:   You have a fever.  You are leaking fluid from your vagina.  You have spotting or bleeding from your vagina.  You have severe abdominal cramping or pain.  You have rapid weight gain or loss.  You have shortness of breath with chest pain.  You notice sudden or extreme swelling of your face, hands, ankles, feet, or legs.  You have not felt your baby move in over an hour.  You have severe headaches that do not go away with  medicine.  You have vision changes. Document Released: 05/19/2001 Document Revised: 05/30/2013 Document Reviewed: 07/26/2012 ExitCare Patient Information 2015 [employer]. This information is not intended to replace advice given to you by your health care provider. Make sure you discuss any questions you have with your health care provider.  Breastfeeding Deciding to breastfeed is one of the best choices you can make for you and your baby. A change in hormones during pregnancy causes your breast tissue to grow and increases the number and size of your milk ducts. These hormones also allow proteins, sugars, and fats from your blood supply to make breast milk in your milk-producing glands. Hormones prevent breast milk from being released before your baby is born as well as prompt milk flow after birth. Once breastfeeding has begun, thoughts of your baby, as well as his or her sucking or crying, can stimulate the release of milk from your milk-producing glands.  BENEFITS OF BREASTFEEDING For Your Baby  Your first   milk (colostrum) helps your baby's digestive system function better.   There are antibodies in your milk that help your baby fight off infections.   Your baby has a lower incidence of asthma, allergies, and sudden infant death syndrome.   The nutrients in breast milk are better for your baby than infant formulas and are designed uniquely for your baby's needs.   Breast milk improves your baby's brain development.   Your baby is less likely to develop other conditions, such as childhood obesity, asthma, or type 2 diabetes mellitus.  For You   Breastfeeding helps to create a very special bond between you and your baby.   Breastfeeding is convenient. Breast milk is always available at the correct temperature and costs nothing.   Breastfeeding helps to burn calories and helps you lose the weight gained during pregnancy.   Breastfeeding makes your uterus contract to its  prepregnancy size faster and slows bleeding (lochia) after you give birth.   Breastfeeding helps to lower your risk of developing type 2 diabetes mellitus, osteoporosis, and breast or ovarian cancer later in life. SIGNS THAT YOUR BABY IS HUNGRY Early Signs of Hunger  Increased alertness or activity.  Stretching.  Movement of the head from side to side.  Movement of the head and opening of the mouth when the corner of the mouth or cheek is stroked (rooting).  Increased sucking sounds, smacking lips, cooing, sighing, or squeaking.  Hand-to-mouth movements.  Increased sucking of fingers or hands. Late Signs of Hunger  Fussing.  Intermittent crying. Extreme Signs of Hunger Signs of extreme hunger will require calming and consoling before your baby will be able to breastfeed successfully. Do not wait for the following signs of extreme hunger to occur before you initiate breastfeeding:   Restlessness.  A loud, strong cry.   Screaming. BREASTFEEDING BASICS Breastfeeding Initiation  Find a comfortable place to sit or lie down, with your neck and back well supported.  Place a pillow or rolled up blanket under your baby to bring him or her to the level of your breast (if you are seated). Nursing pillows are specially designed to help support your arms and your baby while you breastfeed.  Make sure that your baby's abdomen is facing your abdomen.   Gently massage your breast. With your fingertips, massage from your chest wall toward your nipple in a circular motion. This encourages milk flow. You may need to continue this action during the feeding if your milk flows slowly.  Support your breast with 4 fingers underneath and your thumb above your nipple. Make sure your fingers are well away from your nipple and your baby's mouth.   Stroke your baby's lips gently with your finger or nipple.   When your baby's mouth is open wide enough, quickly bring your baby to your breast,  placing your entire nipple and as much of the colored area around your nipple (areola) as possible into your baby's mouth.   More areola should be visible above your baby's upper lip than below the lower lip.   Your baby's tongue should be between his or her lower gum and your breast.   Ensure that your baby's mouth is correctly positioned around your nipple (latched). Your baby's lips should create a seal on your breast and be turned out (everted).  It is common for your baby to suck about 2-3 minutes in order to start the flow of breast milk. Latching Teaching your baby how to latch on to your breast   properly is very important. An improper latch can cause nipple pain and decreased milk supply for you and poor weight gain in your baby. Also, if your baby is not latched onto your nipple properly, he or she may swallow some air during feeding. This can make your baby fussy. Burping your baby when you switch breasts during the feeding can help to get rid of the air. However, teaching your baby to latch on properly is still the best way to prevent fussiness from swallowing air while breastfeeding. Signs that your baby has successfully latched on to your nipple:    Silent tugging or silent sucking, without causing you pain.   Swallowing heard between every 3-4 sucks.    Muscle movement above and in front of his or her ears while sucking.  Signs that your baby has not successfully latched on to nipple:   Sucking sounds or smacking sounds from your baby while breastfeeding.  Nipple pain. If you think your baby has not latched on correctly, slip your finger into the corner of your baby's mouth to break the suction and place it between your baby's gums. Attempt breastfeeding initiation again. Signs of Successful Breastfeeding Signs from your baby:   A gradual decrease in the number of sucks or complete cessation of sucking.   Falling asleep.   Relaxation of his or her body.    Retention of a small amount of milk in his or her mouth.   Letting go of your breast by himself or herself. Signs from you:  Breasts that have increased in firmness, weight, and size 1-3 hours after feeding.   Breasts that are softer immediately after breastfeeding.  Increased milk volume, as well as a change in milk consistency and color by the fifth day of breastfeeding.   Nipples that are not sore, cracked, or bleeding. Signs That Your Baby is Getting Enough Milk  Wetting at least 3 diapers in a 24-hour period. The urine should be clear and pale yellow by age 5 days.  At least 3 stools in a 24-hour period by age 5 days. The stool should be soft and yellow.  At least 3 stools in a 24-hour period by age 7 days. The stool should be seedy and yellow.  No loss of weight greater than 10% of birth weight during the first 3 days of age.  Average weight gain of 4-7 ounces (113-198 g) per week after age 4 days.  Consistent daily weight gain by age 5 days, without weight loss after the age of 2 weeks. After a feeding, your baby may spit up a small amount. This is common. BREASTFEEDING FREQUENCY AND DURATION Frequent feeding will help you make more milk and can prevent sore nipples and breast engorgement. Breastfeed when you feel the need to reduce the fullness of your breasts or when your baby shows signs of hunger. This is called "breastfeeding on demand." Avoid introducing a pacifier to your baby while you are working to establish breastfeeding (the first 4-6 weeks after your baby is born). After this time you may choose to use a pacifier. Research has shown that pacifier use during the first year of a baby's life decreases the risk of sudden infant death syndrome (SIDS). Allow your baby to feed on each breast as long as he or she wants. Breastfeed until your baby is finished feeding. When your baby unlatches or falls asleep while feeding from the first breast, offer the second breast.  Because newborns are often sleepy in the   first few weeks of life, you may need to awaken your baby to get him or her to feed. Breastfeeding times will vary from baby to baby. However, the following rules can serve as a guide to help you ensure that your baby is properly fed:  Newborns (babies 4 weeks of age or younger) may breastfeed every 1-3 hours.  Newborns should not go longer than 3 hours during the day or 5 hours during the night without breastfeeding.  You should breastfeed your baby a minimum of 8 times in a 24-hour period until you begin to introduce solid foods to your baby at around 6 months of age. BREAST MILK PUMPING Pumping and storing breast milk allows you to ensure that your baby is exclusively fed your breast milk, even at times when you are unable to breastfeed. This is especially important if you are going back to work while you are still breastfeeding or when you are not able to be present during feedings. Your lactation consultant can give you guidelines on how long it is safe to store breast milk.  A breast pump is a machine that allows you to pump milk from your breast into a sterile bottle. The pumped breast milk can then be stored in a refrigerator or freezer. Some breast pumps are operated by hand, while others use electricity. Ask your lactation consultant which type will work best for you. Breast pumps can be purchased, but some hospitals and breastfeeding support groups lease breast pumps on a monthly basis. A lactation consultant can teach you how to hand express breast milk, if you prefer not to use a pump.  CARING FOR YOUR BREASTS WHILE YOU BREASTFEED Nipples can become dry, cracked, and sore while breastfeeding. The following recommendations can help keep your breasts moisturized and healthy:  Avoid using soap on your nipples.   Wear a supportive bra. Although not required, special nursing bras and tank tops are designed to allow access to your breasts for  breastfeeding without taking off your entire bra or top. Avoid wearing underwire-style bras or extremely tight bras.  Air dry your nipples for 3-4minutes after each feeding.   Use only cotton bra pads to absorb leaked breast milk. Leaking of breast milk between feedings is normal.   Use lanolin on your nipples after breastfeeding. Lanolin helps to maintain your skin's normal moisture barrier. If you use pure lanolin, you do not need to wash it off before feeding your baby again. Pure lanolin is not toxic to your baby. You may also hand express a few drops of breast milk and gently massage that milk into your nipples and allow the milk to air dry. In the first few weeks after giving birth, some women experience extremely full breasts (engorgement). Engorgement can make your breasts feel heavy, warm, and tender to the touch. Engorgement peaks within 3-5 days after you give birth. The following recommendations can help ease engorgement:  Completely empty your breasts while breastfeeding or pumping. You may want to start by applying warm, moist heat (in the shower or with warm water-soaked hand towels) just before feeding or pumping. This increases circulation and helps the milk flow. If your baby does not completely empty your breasts while breastfeeding, pump any extra milk after he or she is finished.  Wear a snug bra (nursing or regular) or tank top for 1-2 days to signal your body to slightly decrease milk production.  Apply ice packs to your breasts, unless this is too uncomfortable for you.    Make sure that your baby is latched on and positioned properly while breastfeeding. If engorgement persists after 48 hours of following these recommendations, contact your health care provider or a lactation consultant. OVERALL HEALTH CARE RECOMMENDATIONS WHILE BREASTFEEDING  Eat healthy foods. Alternate between meals and snacks, eating 3 of each per day. Because what you eat affects your breast milk,  some of the foods may make your baby more irritable than usual. Avoid eating these foods if you are sure that they are negatively affecting your baby.  Drink milk, fruit juice, and water to satisfy your thirst (about 10 glasses a day).   Rest often, relax, and continue to take your prenatal vitamins to prevent fatigue, stress, and anemia.  Continue breast self-awareness checks.  Avoid chewing and smoking tobacco.  Avoid alcohol and drug use. Some medicines that may be harmful to your baby can pass through breast milk. It is important to ask your health care provider before taking any medicine, including all over-the-counter and prescription medicine as well as vitamin and herbal supplements. It is possible to become pregnant while breastfeeding. If birth control is desired, ask your health care provider about options that will be safe for your baby. SEEK MEDICAL CARE IF:   You feel like you want to stop breastfeeding or have become frustrated with breastfeeding.  You have painful breasts or nipples.  Your nipples are cracked or bleeding.  Your breasts are red, tender, or warm.  You have a swollen area on either breast.  You have a fever or chills.  You have nausea or vomiting.  You have drainage other than breast milk from your nipples.  Your breasts do not become full before feedings by the fifth day after you give birth.  You feel sad and depressed.  Your baby is too sleepy to eat well.  Your baby is having trouble sleeping.   Your baby is wetting less than 3 diapers in a 24-hour period.  Your baby has less than 3 stools in a 24-hour period.  Your baby's skin or the white part of his or her eyes becomes yellow.   Your baby is not gaining weight by 5 days of age. SEEK IMMEDIATE MEDICAL CARE IF:   Your baby is overly tired (lethargic) and does not want to wake up and feed.  Your baby develops an unexplained fever. Document Released: 05/25/2005 Document Revised:  05/30/2013 Document Reviewed: 11/16/2012 ExitCare Patient Information 2015 [employer]. This information is not intended to replace advice given to you by your health care provider. Make sure you discuss any questions you have with your health care provider.  

## 2015-01-14 NOTE — Progress Notes (Signed)
Ultrasound scheduled for 01/15/2015 @ 9:00AM

## 2015-01-14 NOTE — Progress Notes (Signed)
Pain- lower back; cramping  Weight gain of 25-35lbs  New ob packet given  Makena application filled out and faxed today

## 2015-01-15 ENCOUNTER — Ambulatory Visit (HOSPITAL_COMMUNITY)
Admission: RE | Admit: 2015-01-15 | Discharge: 2015-01-15 | Disposition: A | Discharge status: Home / Self Care | Payer: Medicaid Other | Source: Ambulatory Visit | Attending: Family Medicine | Admitting: Family Medicine

## 2015-01-15 ENCOUNTER — Encounter: Payer: Self-pay | Admitting: Family Medicine

## 2015-01-15 ENCOUNTER — Other Ambulatory Visit: Payer: Self-pay | Admitting: Family Medicine

## 2015-01-15 DIAGNOSIS — Z3A00 Weeks of gestation of pregnancy not specified: Secondary | ICD-10-CM | POA: Insufficient documentation

## 2015-01-15 DIAGNOSIS — Z1389 Encounter for screening for other disorder: Secondary | ICD-10-CM

## 2015-01-15 DIAGNOSIS — Z3A19 19 weeks gestation of pregnancy: Secondary | ICD-10-CM

## 2015-01-15 DIAGNOSIS — O4692 Antepartum hemorrhage, unspecified, second trimester: Secondary | ICD-10-CM

## 2015-01-15 DIAGNOSIS — O0992 Supervision of high risk pregnancy, unspecified, second trimester: Secondary | ICD-10-CM

## 2015-01-15 DIAGNOSIS — O0932 Supervision of pregnancy with insufficient antenatal care, second trimester: Secondary | ICD-10-CM

## 2015-01-15 DIAGNOSIS — Z8751 Personal history of pre-term labor: Secondary | ICD-10-CM

## 2015-01-15 DIAGNOSIS — O09292 Supervision of pregnancy with other poor reproductive or obstetric history, second trimester: Secondary | ICD-10-CM | POA: Diagnosis present

## 2015-01-15 DIAGNOSIS — O418X90 Other specified disorders of amniotic fluid and membranes, unspecified trimester, not applicable or unspecified: Secondary | ICD-10-CM | POA: Insufficient documentation

## 2015-01-15 DIAGNOSIS — O468X9 Other antepartum hemorrhage, unspecified trimester: Secondary | ICD-10-CM | POA: Insufficient documentation

## 2015-01-15 LAB — PRENATAL PROFILE (SOLSTAS)
ANTIBODY SCREEN: NEGATIVE
Basophils Absolute: 0 10*3/uL (ref 0.0–0.1)
Basophils Relative: 0 % (ref 0–1)
EOS ABS: 0.2 10*3/uL (ref 0.0–0.7)
EOS PCT: 3 % (ref 0–5)
HCT: 31.5 % — ABNORMAL LOW (ref 36.0–46.0)
HEMOGLOBIN: 10.5 g/dL — AB (ref 12.0–15.0)
HEP B S AG: NEGATIVE
HIV 1&2 Ab, 4th Generation: NONREACTIVE
LYMPHS PCT: 21 % (ref 12–46)
Lymphs Abs: 1.4 10*3/uL (ref 0.7–4.0)
MCH: 25.7 pg — AB (ref 26.0–34.0)
MCHC: 33.3 g/dL (ref 30.0–36.0)
MCV: 77.2 fL — ABNORMAL LOW (ref 78.0–100.0)
MPV: 9.8 fL (ref 8.6–12.4)
Monocytes Absolute: 0.7 10*3/uL (ref 0.1–1.0)
Monocytes Relative: 10 % (ref 3–12)
NEUTROS ABS: 4.3 10*3/uL (ref 1.7–7.7)
Neutrophils Relative %: 66 % (ref 43–77)
Platelets: 228 10*3/uL (ref 150–400)
RBC: 4.08 MIL/uL (ref 3.87–5.11)
RDW: 14.3 % (ref 11.5–15.5)
Rh Type: POSITIVE
Rubella: 2.42 Index — ABNORMAL HIGH (ref <0.90)
WBC: 6.5 10*3/uL (ref 4.0–10.5)

## 2015-01-15 LAB — AFP, QUAD SCREEN
AFP: 98.7 ng/mL
Curr Gest Age: 19.4 wks.days
HCG TOTAL: 9.28 [IU]/mL
INH: 153.2 pg/mL
INTERPRETATION-AFP: NEGATIVE
MOM FOR AFP: 1.81
MOM FOR HCG: 0.4
MoM for INH: 0.84
Open Spina bifida: NEGATIVE
Osb Risk: 1:1220 {titer}
Tri 18 Scr Risk Est: NEGATIVE
Trisomy 18 (Edward) Syndrome Interp.: 1:13200 {titer}
uE3 Mom: 1.02
uE3 Value: 1.77 ng/mL

## 2015-01-15 LAB — CULTURE, OB URINE
COLONY COUNT: NO GROWTH
Organism ID, Bacteria: NO GROWTH

## 2015-01-15 LAB — CYTOLOGY - PAP

## 2015-01-16 ENCOUNTER — Other Ambulatory Visit: Payer: Self-pay | Admitting: Family Medicine

## 2015-01-16 DIAGNOSIS — IMO0002 Reserved for concepts with insufficient information to code with codable children: Secondary | ICD-10-CM

## 2015-01-16 DIAGNOSIS — O09292 Supervision of pregnancy with other poor reproductive or obstetric history, second trimester: Secondary | ICD-10-CM

## 2015-01-16 DIAGNOSIS — Z0489 Encounter for examination and observation for other specified reasons: Secondary | ICD-10-CM

## 2015-01-16 DIAGNOSIS — O4692 Antepartum hemorrhage, unspecified, second trimester: Secondary | ICD-10-CM

## 2015-01-16 DIAGNOSIS — Z3A21 21 weeks gestation of pregnancy: Secondary | ICD-10-CM

## 2015-01-16 LAB — PRESCRIPTION MONITORING PROFILE (19 PANEL)
Amphetamine/Meth: NEGATIVE ng/mL
BUPRENORPHINE, URINE: NEGATIVE ng/mL
Barbiturate Screen, Urine: NEGATIVE ng/mL
Benzodiazepine Screen, Urine: NEGATIVE ng/mL
CARISOPRODOL, URINE: NEGATIVE ng/mL
COCAINE METABOLITES: NEGATIVE ng/mL
Cannabinoid Scrn, Ur: NEGATIVE ng/mL
Creatinine, Urine: 139.16 mg/dL (ref >20.0)
ECSTASY: NEGATIVE ng/mL
Fentanyl, Ur: NEGATIVE ng/mL
MEPERIDINE UR: NEGATIVE ng/mL
METHAQUALONE SCREEN (URINE): NEGATIVE ng/mL
Methadone Screen, Urine: NEGATIVE ng/mL
Nitrites, Initial: NEGATIVE ug/mL
Opiate Screen, Urine: NEGATIVE ng/mL
Oxycodone Screen, Ur: NEGATIVE ng/mL
PROPOXYPHENE: NEGATIVE ng/mL
Phencyclidine, Ur: NEGATIVE ng/mL
Tapentadol, urine: NEGATIVE ng/mL
Tramadol Scrn, Ur: NEGATIVE ng/mL
ZOLPIDEM, URINE: NEGATIVE ng/mL
pH, Initial: 6.3 pH (ref 4.5–8.9)

## 2015-01-17 ENCOUNTER — Telehealth: Payer: Self-pay | Admitting: *Deleted

## 2015-01-17 NOTE — Telephone Encounter (Signed)
Received message from [facility] pharmacy to set up delivery of Makena medication. I returned the call and was informed that pt does qualify for the medication. Delivery will be on 8/15.  I then called pt on mobile # and was told by a female to call pt on tel # [phone]. I called and spoke w/pt and informed her that she has qualified for the [facility] medication. We would like to administer the first dose on Tuesday 8/16 and then every Thursday thereafter.  Pt agreed and voiced understanding of information given. She requested appt on 8/16 @ 0900 which was given.

## 2015-01-18 ENCOUNTER — Inpatient Hospital Stay (HOSPITAL_COMMUNITY)
Admission: AD | Admit: 2015-01-18 | Discharge: 2015-01-18 | Disposition: A | Discharge status: Home / Self Care | Payer: Medicaid Other | Source: Ambulatory Visit | Attending: Family Medicine | Admitting: Family Medicine

## 2015-01-18 ENCOUNTER — Encounter (HOSPITAL_COMMUNITY): Payer: Self-pay

## 2015-01-18 DIAGNOSIS — Z3A20 20 weeks gestation of pregnancy: Secondary | ICD-10-CM | POA: Insufficient documentation

## 2015-01-18 DIAGNOSIS — O4692 Antepartum hemorrhage, unspecified, second trimester: Secondary | ICD-10-CM

## 2015-01-18 DIAGNOSIS — O208 Other hemorrhage in early pregnancy: Secondary | ICD-10-CM | POA: Diagnosis not present

## 2015-01-18 NOTE — MAU Provider Note (Signed)
  History     CSN: [id number]  Arrival date and time: 01/18/15 2145   None     No chief complaint on file.  HPI Ms Rivenbark is a 29yo [id number] @ 20.2wks who presents for increased amt of vag bldg this AM at approx 9. From a scan dated 8/9 she has a known Csf - [place] 3.9x 1 by 4x3.1cm; placenta along the ant uterine wall; cx 4.4cm thick. She is used to seeing old blood daily, but became concerned when it was heavier this morning. She is a pt of the [facility] and her hx is significant for having one delivery @ 36.6wks followed by 3 term deliveries; took 17P w/ those pregnancies and is taking it w/ this one as well. Blood type: B+; neg wet prep and GC/chlam on 01/03/15.  OB History    Gravida Para Term Preterm AB TAB SAB Ectopic Multiple Living   0 0 0 0 0 4      Past Medical History  Diagnosis Date  . Preterm labor   . Urinary tract infection   . Gonorrhea   . Chlamydia   . Abnormal Pap smear     f/u was normal    Past Surgical History  Procedure Laterality Date  . No past surgeries      Family History  Problem Relation Age of Onset  . Other Neg Hx   . Hearing loss Neg Hx     Social History  Substance Use Topics  . Smoking status: Never Smoker   . Smokeless tobacco: Never Used  . Alcohol Use: No    Allergies: No Known Allergies  Facility-administered medications prior to admission  Medication Dose Route Frequency Provider Last Rate Last Dose  . hydroxyprogesterone caproate (DELALUTIN) 250 mg/mL injection 250 mg  250 mg Intramuscular Weekly Reva Bores, MD       Prescriptions prior to admission  Medication Sig Dispense Refill Last Dose  . Prenatal Vit-Fe Fumarate-FA (PRENATAL COMPLETE) 14-0.4 MG TABS Take 14 mg by mouth daily. 60 each 4 01/17/2015 at Unknown time    ROS No other pertinents other than what is noted in HPI  Physical Exam   Blood pressure 120/69, pulse 92, temperature 98.1 F (36.7 C), temperature source Oral, resp. rate 18, height  (1.778 m),  weight 67.132 kg (148 lb), last menstrual period 08/28/2014, currently breastfeeding.  Physical Exam  Constitutional: She is oriented to person, place, and time. She appears well-developed.  HENT:  Head: Normocephalic.  Neck: Normal range of motion.  Cardiovascular: Normal rate.   Respiratory: Effort normal.  GI:  Abd soft, gravid FHT dopplered @ 145bpm  Genitourinary:  SE: very sm amt of dark red/brown mucous; no active bldg seen Cx C/L  Musculoskeletal: Normal range of motion.  Neurological: She is alert and oriented to person, place, and time.  Skin: Skin is warm and dry.  Psychiatric: She has a normal mood and affect. Her behavior is normal. Thought content normal.    MAU Course  Procedures  MDM SE performed  Assessment and Plan  IUP@20 .2wks Subchorionic hemorrhage w/ increased vag bldg earlier  D/C home Rec to rest as much as possible F/U at clinic on 8/16 as sched, and also to have a rpt U/S 8/23 w/ possible plan for BMZ 22-23wks  SHAW, KIMBERLY CNM 01/18/2015, 11:24 PM

## 2015-01-18 NOTE — MAU Note (Signed)
Pt states vag bleeding started around 0900 today. Has had bleeding before, diagnosed with a subchorionic hemorrhage. States that bleeding has previously been brown/old blood but today it is bright red. Has some cramping that she rates 6/10. Passed a clot that she states was the size of a golf ball. No other concerns

## 2015-01-18 NOTE — Discharge Instructions (Signed)

## 2015-01-22 ENCOUNTER — Ambulatory Visit: Payer: Self-pay

## 2015-01-22 ENCOUNTER — Encounter: Payer: Self-pay | Admitting: Obstetrics & Gynecology

## 2015-01-24 ENCOUNTER — Ambulatory Visit (INDEPENDENT_AMBULATORY_CARE_PROVIDER_SITE_OTHER): Payer: Medicaid Other

## 2015-01-24 VITALS — BP 110/62 | HR 82 | Temp 97.8°F | Resp 16 | Wt 146.2 lb

## 2015-01-24 DIAGNOSIS — O09212 Supervision of pregnancy with history of pre-term labor, second trimester: Secondary | ICD-10-CM | POA: Diagnosis present

## 2015-01-29 ENCOUNTER — Other Ambulatory Visit: Payer: Self-pay | Admitting: Family Medicine

## 2015-01-29 ENCOUNTER — Encounter (HOSPITAL_COMMUNITY): Payer: Self-pay

## 2015-01-29 ENCOUNTER — Ambulatory Visit (HOSPITAL_COMMUNITY)
Admission: RE | Admit: 2015-01-29 | Discharge: 2015-01-29 | Disposition: A | Discharge status: Home / Self Care | Payer: Medicaid Other | Source: Ambulatory Visit | Attending: Family Medicine | Admitting: Family Medicine

## 2015-01-29 VITALS — BP 115/66 | HR 97 | Wt 151.4 lb

## 2015-01-29 DIAGNOSIS — O0932 Supervision of pregnancy with insufficient antenatal care, second trimester: Secondary | ICD-10-CM

## 2015-01-29 DIAGNOSIS — Z3A21 21 weeks gestation of pregnancy: Secondary | ICD-10-CM

## 2015-01-29 DIAGNOSIS — Z0489 Encounter for examination and observation for other specified reasons: Secondary | ICD-10-CM

## 2015-01-29 DIAGNOSIS — O4590 Premature separation of placenta, unspecified, unspecified trimester: Secondary | ICD-10-CM | POA: Diagnosis present

## 2015-01-29 DIAGNOSIS — IMO0002 Reserved for concepts with insufficient information to code with codable children: Secondary | ICD-10-CM

## 2015-01-29 DIAGNOSIS — O468X9 Other antepartum hemorrhage, unspecified trimester: Secondary | ICD-10-CM

## 2015-01-29 DIAGNOSIS — Z36 Encounter for antenatal screening of mother: Secondary | ICD-10-CM | POA: Diagnosis not present

## 2015-01-29 DIAGNOSIS — O09292 Supervision of pregnancy with other poor reproductive or obstetric history, second trimester: Secondary | ICD-10-CM

## 2015-01-29 DIAGNOSIS — Z3A22 22 weeks gestation of pregnancy: Secondary | ICD-10-CM | POA: Diagnosis not present

## 2015-01-29 DIAGNOSIS — O209 Hemorrhage in early pregnancy, unspecified: Secondary | ICD-10-CM | POA: Insufficient documentation

## 2015-01-29 DIAGNOSIS — O0992 Supervision of high risk pregnancy, unspecified, second trimester: Secondary | ICD-10-CM

## 2015-01-29 DIAGNOSIS — Z8751 Personal history of pre-term labor: Secondary | ICD-10-CM | POA: Diagnosis not present

## 2015-01-29 DIAGNOSIS — O418X90 Other specified disorders of amniotic fluid and membranes, unspecified trimester, not applicable or unspecified: Secondary | ICD-10-CM

## 2015-01-29 DIAGNOSIS — O4692 Antepartum hemorrhage, unspecified, second trimester: Secondary | ICD-10-CM

## 2015-01-29 NOTE — ED Notes (Signed)
Pt continues to have small amt of dark brownish bleeding.

## 2015-01-31 ENCOUNTER — Ambulatory Visit (INDEPENDENT_AMBULATORY_CARE_PROVIDER_SITE_OTHER): Payer: Medicaid Other | Admitting: Family Medicine

## 2015-01-31 VITALS — BP 108/62 | HR 82 | Temp 98.2°F | Wt 150.1 lb

## 2015-01-31 DIAGNOSIS — O0992 Supervision of high risk pregnancy, unspecified, second trimester: Secondary | ICD-10-CM

## 2015-01-31 DIAGNOSIS — Z23 Encounter for immunization: Secondary | ICD-10-CM | POA: Diagnosis not present

## 2015-01-31 DIAGNOSIS — O09212 Supervision of pregnancy with history of pre-term labor, second trimester: Secondary | ICD-10-CM

## 2015-01-31 LAB — POCT URINALYSIS DIP (DEVICE)
BILIRUBIN URINE: NEGATIVE
GLUCOSE, UA: NEGATIVE mg/dL
Hgb urine dipstick: NEGATIVE
KETONES UR: NEGATIVE mg/dL
LEUKOCYTES UA: NEGATIVE
Nitrite: NEGATIVE
Protein, ur: NEGATIVE mg/dL
SPECIFIC GRAVITY, URINE: 1.025 (ref 1.005–1.030)
Urobilinogen, UA: 1 mg/dL (ref 0.0–1.0)
pH: 6.5 (ref 5.0–8.0)

## 2015-01-31 NOTE — Patient Instructions (Signed)
Second Trimester of Pregnancy The second trimester is from week 13 through week 28, months 4 through 6. The second trimester is often a time when you feel your best. Your body has also adjusted to being pregnant, and you begin to feel better physically. Usually, morning sickness has lessened or quit completely, you may have more energy, and you may have an increase in appetite. The second trimester is also a time when the fetus is growing rapidly. At the end of the sixth month, the fetus is about 9 inches long and weighs about 1 pounds. You will likely begin to feel the baby move (quickening) between 18 and 20 weeks of the pregnancy. BODY CHANGES Your body goes through many changes during pregnancy. The changes vary from woman to woman.   Your weight will continue to increase. You will notice your lower abdomen bulging out.  You may begin to get stretch marks on your hips, abdomen, and breasts.  You may develop headaches that can be relieved by medicines approved by your health care provider.  You may urinate more often because the fetus is pressing on your bladder.  You may develop or continue to have heartburn as a result of your pregnancy.  You may develop constipation because certain hormones are causing the muscles that push waste through your intestines to slow down.  You may develop hemorrhoids or swollen, bulging veins (varicose veins).  You may have back pain because of the weight gain and pregnancy hormones relaxing your joints between the bones in your pelvis and as a result of a shift in weight and the muscles that support your balance.  Your breasts will continue to grow and be tender.  Your gums may bleed and may be sensitive to brushing and flossing.  Dark spots or blotches (chloasma, mask of pregnancy) may develop on your face. This will likely fade after the baby is born.  A dark line from your belly button to the pubic area (linea nigra) may appear. This will likely  fade after the baby is born.  You may have changes in your hair. These can include thickening of your hair, rapid growth, and changes in texture. Some women also have hair loss during or after pregnancy, or hair that feels dry or thin. Your hair will most likely return to normal after your baby is born. WHAT TO EXPECT AT YOUR PRENATAL VISITS During a routine prenatal visit:  You will be weighed to make sure you and the fetus are growing normally.  Your blood pressure will be taken.  Your abdomen will be measured to track your baby's growth.  The fetal heartbeat will be listened to.  Any test results from the previous visit will be discussed. Your health care provider may ask you:  How you are feeling.  If you are feeling the baby move.  If you have had any abnormal symptoms, such as leaking fluid, bleeding, severe headaches, or abdominal cramping.  If you have any questions. Other tests that may be performed during your second trimester include:  Blood tests that check for:  Low iron levels (anemia).  Gestational diabetes (between 24 and 28 weeks).  Rh antibodies.  Urine tests to check for infections, diabetes, or protein in the urine.  An ultrasound to confirm the proper growth and development of the baby.  An amniocentesis to check for possible genetic problems.  Fetal screens for spina bifida and Down syndrome. HOME CARE INSTRUCTIONS   Avoid all smoking, herbs, alcohol, and unprescribed   drugs. These chemicals affect the formation and growth of the baby.  Follow your health care provider's instructions regarding medicine use. There are medicines that are either safe or unsafe to take during pregnancy.  Exercise only as directed by your health care provider. Experiencing uterine cramps is a good sign to stop exercising.  Continue to eat regular, healthy meals.  Wear a good support bra for breast tenderness.  Do not use hot tubs, steam rooms, or saunas.  Wear  your seat belt at all times when driving.  Avoid raw meat, uncooked cheese, cat litter boxes, and soil used by cats. These carry germs that can cause birth defects in the baby.  Take your prenatal vitamins.  Try taking a stool softener (if your health care provider approves) if you develop constipation. Eat more high-fiber foods, such as fresh vegetables or fruit and whole grains. Drink plenty of fluids to keep your urine clear or pale yellow.  Take warm sitz baths to soothe any pain or discomfort caused by hemorrhoids. Use hemorrhoid cream if your health care provider approves.  If you develop varicose veins, wear support hose. Elevate your feet for 15 minutes, 3-4 times a day. Limit salt in your diet.  Avoid heavy lifting, wear low heel shoes, and practice good posture.  Rest with your legs elevated if you have leg cramps or low back pain.  Visit your dentist if you have not gone yet during your pregnancy. Use a soft toothbrush to brush your teeth and be gentle when you floss.  A sexual relationship may be continued unless your health care provider directs you otherwise.  Continue to go to all your prenatal visits as directed by your health care provider. SEEK MEDICAL CARE IF:   You have dizziness.  You have mild pelvic cramps, pelvic pressure, or nagging pain in the abdominal area.  You have persistent nausea, vomiting, or diarrhea.  You have a bad smelling vaginal discharge.  You have pain with urination. SEEK IMMEDIATE MEDICAL CARE IF:   You have a fever.  You are leaking fluid from your vagina.  You have spotting or bleeding from your vagina.  You have severe abdominal cramping or pain.  You have rapid weight gain or loss.  You have shortness of breath with chest pain.  You notice sudden or extreme swelling of your face, hands, ankles, feet, or legs.  You have not felt your baby move in over an hour.  You have severe headaches that do not go away with  medicine.  You have vision changes. Document Released: 05/19/2001 Document Revised: 05/30/2013 Document Reviewed: 07/26/2012 ExitCare Patient Information 2015 [employer]. This information is not intended to replace advice given to you by your health care provider. Make sure you discuss any questions you have with your health care provider.  Breastfeeding Deciding to breastfeed is one of the best choices you can make for you and your baby. A change in hormones during pregnancy causes your breast tissue to grow and increases the number and size of your milk ducts. These hormones also allow proteins, sugars, and fats from your blood supply to make breast milk in your milk-producing glands. Hormones prevent breast milk from being released before your baby is born as well as prompt milk flow after birth. Once breastfeeding has begun, thoughts of your baby, as well as his or her sucking or crying, can stimulate the release of milk from your milk-producing glands.  BENEFITS OF BREASTFEEDING For Your Baby  Your first   milk (colostrum) helps your baby's digestive system function better.   There are antibodies in your milk that help your baby fight off infections.   Your baby has a lower incidence of asthma, allergies, and sudden infant death syndrome.   The nutrients in breast milk are better for your baby than infant formulas and are designed uniquely for your baby's needs.   Breast milk improves your baby's brain development.   Your baby is less likely to develop other conditions, such as childhood obesity, asthma, or type 2 diabetes mellitus.  For You   Breastfeeding helps to create a very special bond between you and your baby.   Breastfeeding is convenient. Breast milk is always available at the correct temperature and costs nothing.   Breastfeeding helps to burn calories and helps you lose the weight gained during pregnancy.   Breastfeeding makes your uterus contract to its  prepregnancy size faster and slows bleeding (lochia) after you give birth.   Breastfeeding helps to lower your risk of developing type 2 diabetes mellitus, osteoporosis, and breast or ovarian cancer later in life. SIGNS THAT YOUR BABY IS HUNGRY Early Signs of Hunger  Increased alertness or activity.  Stretching.  Movement of the head from side to side.  Movement of the head and opening of the mouth when the corner of the mouth or cheek is stroked (rooting).  Increased sucking sounds, smacking lips, cooing, sighing, or squeaking.  Hand-to-mouth movements.  Increased sucking of fingers or hands. Late Signs of Hunger  Fussing.  Intermittent crying. Extreme Signs of Hunger Signs of extreme hunger will require calming and consoling before your baby will be able to breastfeed successfully. Do not wait for the following signs of extreme hunger to occur before you initiate breastfeeding:   Restlessness.  A loud, strong cry.   Screaming. BREASTFEEDING BASICS Breastfeeding Initiation  Find a comfortable place to sit or lie down, with your neck and back well supported.  Place a pillow or rolled up blanket under your baby to bring him or her to the level of your breast (if you are seated). Nursing pillows are specially designed to help support your arms and your baby while you breastfeed.  Make sure that your baby's abdomen is facing your abdomen.   Gently massage your breast. With your fingertips, massage from your chest wall toward your nipple in a circular motion. This encourages milk flow. You may need to continue this action during the feeding if your milk flows slowly.  Support your breast with 4 fingers underneath and your thumb above your nipple. Make sure your fingers are well away from your nipple and your baby's mouth.   Stroke your baby's lips gently with your finger or nipple.   When your baby's mouth is open wide enough, quickly bring your baby to your breast,  placing your entire nipple and as much of the colored area around your nipple (areola) as possible into your baby's mouth.   More areola should be visible above your baby's upper lip than below the lower lip.   Your baby's tongue should be between his or her lower gum and your breast.   Ensure that your baby's mouth is correctly positioned around your nipple (latched). Your baby's lips should create a seal on your breast and be turned out (everted).  It is common for your baby to suck about 2-3 minutes in order to start the flow of breast milk. Latching Teaching your baby how to latch on to your breast   properly is very important. An improper latch can cause nipple pain and decreased milk supply for you and poor weight gain in your baby. Also, if your baby is not latched onto your nipple properly, he or she may swallow some air during feeding. This can make your baby fussy. Burping your baby when you switch breasts during the feeding can help to get rid of the air. However, teaching your baby to latch on properly is still the best way to prevent fussiness from swallowing air while breastfeeding. Signs that your baby has successfully latched on to your nipple:    Silent tugging or silent sucking, without causing you pain.   Swallowing heard between every 3-4 sucks.    Muscle movement above and in front of his or her ears while sucking.  Signs that your baby has not successfully latched on to nipple:   Sucking sounds or smacking sounds from your baby while breastfeeding.  Nipple pain. If you think your baby has not latched on correctly, slip your finger into the corner of your baby's mouth to break the suction and place it between your baby's gums. Attempt breastfeeding initiation again. Signs of Successful Breastfeeding Signs from your baby:   A gradual decrease in the number of sucks or complete cessation of sucking.   Falling asleep.   Relaxation of his or her body.    Retention of a small amount of milk in his or her mouth.   Letting go of your breast by himself or herself. Signs from you:  Breasts that have increased in firmness, weight, and size 1-3 hours after feeding.   Breasts that are softer immediately after breastfeeding.  Increased milk volume, as well as a change in milk consistency and color by the fifth day of breastfeeding.   Nipples that are not sore, cracked, or bleeding. Signs That Your Baby is Getting Enough Milk  Wetting at least 3 diapers in a 24-hour period. The urine should be clear and pale yellow by age 5 days.  At least 3 stools in a 24-hour period by age 5 days. The stool should be soft and yellow.  At least 3 stools in a 24-hour period by age 7 days. The stool should be seedy and yellow.  No loss of weight greater than 10% of birth weight during the first 3 days of age.  Average weight gain of 4-7 ounces (113-198 g) per week after age 4 days.  Consistent daily weight gain by age 5 days, without weight loss after the age of 2 weeks. After a feeding, your baby may spit up a small amount. This is common. BREASTFEEDING FREQUENCY AND DURATION Frequent feeding will help you make more milk and can prevent sore nipples and breast engorgement. Breastfeed when you feel the need to reduce the fullness of your breasts or when your baby shows signs of hunger. This is called "breastfeeding on demand." Avoid introducing a pacifier to your baby while you are working to establish breastfeeding (the first 4-6 weeks after your baby is born). After this time you may choose to use a pacifier. Research has shown that pacifier use during the first year of a baby's life decreases the risk of sudden infant death syndrome (SIDS). Allow your baby to feed on each breast as long as he or she wants. Breastfeed until your baby is finished feeding. When your baby unlatches or falls asleep while feeding from the first breast, offer the second breast.  Because newborns are often sleepy in the   first few weeks of life, you may need to awaken your baby to get him or her to feed. Breastfeeding times will vary from baby to baby. However, the following rules can serve as a guide to help you ensure that your baby is properly fed:  Newborns (babies 4 weeks of age or younger) may breastfeed every 1-3 hours.  Newborns should not go longer than 3 hours during the day or 5 hours during the night without breastfeeding.  You should breastfeed your baby a minimum of 8 times in a 24-hour period until you begin to introduce solid foods to your baby at around 6 months of age. BREAST MILK PUMPING Pumping and storing breast milk allows you to ensure that your baby is exclusively fed your breast milk, even at times when you are unable to breastfeed. This is especially important if you are going back to work while you are still breastfeeding or when you are not able to be present during feedings. Your lactation consultant can give you guidelines on how long it is safe to store breast milk.  A breast pump is a machine that allows you to pump milk from your breast into a sterile bottle. The pumped breast milk can then be stored in a refrigerator or freezer. Some breast pumps are operated by hand, while others use electricity. Ask your lactation consultant which type will work best for you. Breast pumps can be purchased, but some hospitals and breastfeeding support groups lease breast pumps on a monthly basis. A lactation consultant can teach you how to hand express breast milk, if you prefer not to use a pump.  CARING FOR YOUR BREASTS WHILE YOU BREASTFEED Nipples can become dry, cracked, and sore while breastfeeding. The following recommendations can help keep your breasts moisturized and healthy:  Avoid using soap on your nipples.   Wear a supportive bra. Although not required, special nursing bras and tank tops are designed to allow access to your breasts for  breastfeeding without taking off your entire bra or top. Avoid wearing underwire-style bras or extremely tight bras.  Air dry your nipples for 3-4minutes after each feeding.   Use only cotton bra pads to absorb leaked breast milk. Leaking of breast milk between feedings is normal.   Use lanolin on your nipples after breastfeeding. Lanolin helps to maintain your skin's normal moisture barrier. If you use pure lanolin, you do not need to wash it off before feeding your baby again. Pure lanolin is not toxic to your baby. You may also hand express a few drops of breast milk and gently massage that milk into your nipples and allow the milk to air dry. In the first few weeks after giving birth, some women experience extremely full breasts (engorgement). Engorgement can make your breasts feel heavy, warm, and tender to the touch. Engorgement peaks within 3-5 days after you give birth. The following recommendations can help ease engorgement:  Completely empty your breasts while breastfeeding or pumping. You may want to start by applying warm, moist heat (in the shower or with warm water-soaked hand towels) just before feeding or pumping. This increases circulation and helps the milk flow. If your baby does not completely empty your breasts while breastfeeding, pump any extra milk after he or she is finished.  Wear a snug bra (nursing or regular) or tank top for 1-2 days to signal your body to slightly decrease milk production.  Apply ice packs to your breasts, unless this is too uncomfortable for you.    Make sure that your baby is latched on and positioned properly while breastfeeding. If engorgement persists after 48 hours of following these recommendations, contact your health care provider or a lactation consultant. OVERALL HEALTH CARE RECOMMENDATIONS WHILE BREASTFEEDING  Eat healthy foods. Alternate between meals and snacks, eating 3 of each per day. Because what you eat affects your breast milk,  some of the foods may make your baby more irritable than usual. Avoid eating these foods if you are sure that they are negatively affecting your baby.  Drink milk, fruit juice, and water to satisfy your thirst (about 10 glasses a day).   Rest often, relax, and continue to take your prenatal vitamins to prevent fatigue, stress, and anemia.  Continue breast self-awareness checks.  Avoid chewing and smoking tobacco.  Avoid alcohol and drug use. Some medicines that may be harmful to your baby can pass through breast milk. It is important to ask your health care provider before taking any medicine, including all over-the-counter and prescription medicine as well as vitamin and herbal supplements. It is possible to become pregnant while breastfeeding. If birth control is desired, ask your health care provider about options that will be safe for your baby. SEEK MEDICAL CARE IF:   You feel like you want to stop breastfeeding or have become frustrated with breastfeeding.  You have painful breasts or nipples.  Your nipples are cracked or bleeding.  Your breasts are red, tender, or warm.  You have a swollen area on either breast.  You have a fever or chills.  You have nausea or vomiting.  You have drainage other than breast milk from your nipples.  Your breasts do not become full before feedings by the fifth day after you give birth.  You feel sad and depressed.  Your baby is too sleepy to eat well.  Your baby is having trouble sleeping.   Your baby is wetting less than 3 diapers in a 24-hour period.  Your baby has less than 3 stools in a 24-hour period.  Your baby's skin or the white part of his or her eyes becomes yellow.   Your baby is not gaining weight by 5 days of age. SEEK IMMEDIATE MEDICAL CARE IF:   Your baby is overly tired (lethargic) and does not want to wake up and feed.  Your baby develops an unexplained fever. Document Released: 05/25/2005 Document Revised:  05/30/2013 Document Reviewed: 11/16/2012 ExitCare Patient Information 2015 [employer]. This information is not intended to replace advice given to you by your health care provider. Make sure you discuss any questions you have with your health care provider.  

## 2015-01-31 NOTE — Progress Notes (Signed)
Subjective:  Carrie Bean is a 29 y.o. [id number] at [redacted]w[redacted]d being seen today for ongoing prenatal care.  Patient reports still with some dark brown bleeding.  Contractions: Not present.   . Movement: Present. Denies leaking of fluid.   The following portions of the patient's history were reviewed and updated as appropriate: allergies, current medications, past family history, past medical history, past social history, past surgical history and problem list.   Objective:   Filed Vitals:   01/31/15 0931  BP: 108/62  Pulse: 82  Temp: 98.2 F (36.8 C)  Weight: 150 lb 1.6 oz (68.085 kg)    Fetal Status: Fetal Heart Rate (bpm): 132 Fundal Height: 24 cm Movement: Present     General:  Alert, oriented and cooperative. Patient is in no acute distress.  Skin: Skin is warm and dry. No rash noted.   Cardiovascular: Normal heart rate noted  Respiratory: Normal respiratory effort, no problems with respiration noted  Abdomen: Soft, gravid, appropriate for gestational age. Pain/Pressure: Present     Pelvic:     Dark brown spotting   Extremities: Normal range of motion.     Mental Status: Normal mood and affect. Normal behavior. Normal judgment and thought content.   Urinalysis: Urine Protein: Negative Urine Glucose: Negative  Assessment and Plan:  Pregnancy: G5P3104 at [redacted]w[redacted]d  1. Pregnancy with history of pre-term labor, second trimester Continue weekly 17 P  2. Supervision of high-risk pregnancy, second trimester Continue routine prenatal care.   3. Need for influenza vaccination  - Flu Vaccine QUAD 36+ mos IM  Preterm labor symptoms and general obstetric precautions including but not limited to vaginal bleeding, contractions, leaking of fluid and fetal movement were reviewed in detail with the patient. Please refer to After Visit Summary for other counseling recommendations.  Return in 4 weeks (on 02/28/2015) for HRC, 17 P weekly.   Reva Bores, MD

## 2015-02-07 ENCOUNTER — Ambulatory Visit (INDEPENDENT_AMBULATORY_CARE_PROVIDER_SITE_OTHER): Payer: Medicaid Other | Admitting: General Practice

## 2015-02-07 VITALS — BP 107/56 | HR 88 | Temp 97.8°F | Ht 70.0 in | Wt 150.4 lb

## 2015-02-07 DIAGNOSIS — O09212 Supervision of pregnancy with history of pre-term labor, second trimester: Secondary | ICD-10-CM

## 2015-02-12 ENCOUNTER — Ambulatory Visit (HOSPITAL_COMMUNITY): Admission: RE | Admit: 2015-02-12 | Payer: Medicaid Other | Source: Ambulatory Visit

## 2015-02-12 ENCOUNTER — Other Ambulatory Visit (HOSPITAL_COMMUNITY): Payer: Self-pay | Admitting: Maternal and Fetal Medicine

## 2015-02-12 DIAGNOSIS — O09892 Supervision of other high risk pregnancies, second trimester: Secondary | ICD-10-CM

## 2015-02-12 DIAGNOSIS — O468X9 Other antepartum hemorrhage, unspecified trimester: Principal | ICD-10-CM

## 2015-02-12 DIAGNOSIS — O418X90 Other specified disorders of amniotic fluid and membranes, unspecified trimester, not applicable or unspecified: Secondary | ICD-10-CM

## 2015-02-12 DIAGNOSIS — Z3A24 24 weeks gestation of pregnancy: Secondary | ICD-10-CM

## 2015-02-12 DIAGNOSIS — O09212 Supervision of pregnancy with history of pre-term labor, second trimester: Secondary | ICD-10-CM

## 2015-02-12 DIAGNOSIS — O09292 Supervision of pregnancy with other poor reproductive or obstetric history, second trimester: Secondary | ICD-10-CM

## 2015-02-14 ENCOUNTER — Ambulatory Visit: Payer: Medicaid Other

## 2015-02-18 ENCOUNTER — Inpatient Hospital Stay (HOSPITAL_COMMUNITY)
Admission: AD | Admit: 2015-02-18 | Discharge: 2015-02-26 | DRG: 782 | Disposition: A | Discharge status: Home / Self Care | Payer: Medicaid Other | Source: Ambulatory Visit | Attending: Family Medicine | Admitting: Family Medicine

## 2015-02-18 ENCOUNTER — Encounter (HOSPITAL_COMMUNITY): Payer: Self-pay | Admitting: *Deleted

## 2015-02-18 ENCOUNTER — Inpatient Hospital Stay (HOSPITAL_COMMUNITY): Payer: Medicaid Other

## 2015-02-18 DIAGNOSIS — O4592 Premature separation of placenta, unspecified, second trimester: Principal | ICD-10-CM | POA: Diagnosis present

## 2015-02-18 DIAGNOSIS — Z8751 Personal history of pre-term labor: Secondary | ICD-10-CM

## 2015-02-18 DIAGNOSIS — Z3A25 25 weeks gestation of pregnancy: Secondary | ICD-10-CM

## 2015-02-18 DIAGNOSIS — O418X90 Other specified disorders of amniotic fluid and membranes, unspecified trimester, not applicable or unspecified: Secondary | ICD-10-CM

## 2015-02-18 DIAGNOSIS — O4692 Antepartum hemorrhage, unspecified, second trimester: Secondary | ICD-10-CM

## 2015-02-18 DIAGNOSIS — O4590 Premature separation of placenta, unspecified, unspecified trimester: Secondary | ICD-10-CM | POA: Diagnosis present

## 2015-02-18 DIAGNOSIS — Z3A24 24 weeks gestation of pregnancy: Secondary | ICD-10-CM | POA: Diagnosis present

## 2015-02-18 DIAGNOSIS — Z3490 Encounter for supervision of normal pregnancy, unspecified, unspecified trimester: Secondary | ICD-10-CM

## 2015-02-18 DIAGNOSIS — O4593 Premature separation of placenta, unspecified, third trimester: Secondary | ICD-10-CM | POA: Diagnosis not present

## 2015-02-18 DIAGNOSIS — O468X9 Other antepartum hemorrhage, unspecified trimester: Secondary | ICD-10-CM

## 2015-02-18 DIAGNOSIS — O209 Hemorrhage in early pregnancy, unspecified: Secondary | ICD-10-CM

## 2015-02-18 HISTORY — DX: Anemia, unspecified: D64.9

## 2015-02-18 LAB — URINALYSIS, ROUTINE W REFLEX MICROSCOPIC
BILIRUBIN URINE: NEGATIVE
Glucose, UA: NEGATIVE mg/dL
HGB URINE DIPSTICK: NEGATIVE
Ketones, ur: NEGATIVE mg/dL
Leukocytes, UA: NEGATIVE
NITRITE: NEGATIVE
PROTEIN: NEGATIVE mg/dL
Specific Gravity, Urine: >1.03 — ABNORMAL HIGH (ref 1.005–1.030)
UROBILINOGEN UA: 0.2 mg/dL (ref 0.0–1.0)
pH: 6 (ref 5.0–8.0)

## 2015-02-18 LAB — TYPE AND SCREEN
ABO/RH(D): B POS
ANTIBODY SCREEN: NEGATIVE

## 2015-02-18 LAB — CBC
HCT: 31 % — ABNORMAL LOW (ref 36.0–46.0)
HEMOGLOBIN: 9.6 g/dL — AB (ref 12.0–15.0)
MCH: 24.4 pg — AB (ref 26.0–34.0)
MCHC: 31 g/dL (ref 30.0–36.0)
MCV: 78.9 fL (ref 78.0–100.0)
Platelets: 247 10*3/uL (ref 150–400)
RBC: 3.93 MIL/uL (ref 3.87–5.11)
RDW: 15.2 % (ref 11.5–15.5)
WBC: 8 10*3/uL (ref 4.0–10.5)

## 2015-02-18 LAB — RAPID URINE DRUG SCREEN, HOSP PERFORMED
AMPHETAMINES: NOT DETECTED
Barbiturates: NOT DETECTED
Benzodiazepines: NOT DETECTED
COCAINE: NOT DETECTED
OPIATES: NOT DETECTED
Tetrahydrocannabinol: NOT DETECTED

## 2015-02-18 MED ORDER — MAGNESIUM SULFATE BOLUS VIA INFUSION
4.0000 g | Freq: Once | INTRAVENOUS | Status: AC
  Administered 2015-02-18: 4 g via INTRAVENOUS
  Filled 2015-02-18: qty 500

## 2015-02-18 MED ORDER — MAGNESIUM SULFATE 50 % IJ SOLN
2.0000 g/h | INTRAMUSCULAR | Status: AC
  Administered 2015-02-19: 2 g/h via INTRAVENOUS
  Filled 2015-02-18 (×2): qty 80

## 2015-02-18 MED ORDER — BETAMETHASONE SOD PHOS & ACET 6 (3-3) MG/ML IJ SUSP
12.0000 mg | INTRAMUSCULAR | Status: AC
  Administered 2015-02-18 – 2015-02-19 (×2): 12 mg via INTRAMUSCULAR
  Filled 2015-02-18 (×2): qty 2

## 2015-02-18 MED ORDER — ZOLPIDEM TARTRATE 5 MG PO TABS: 5.0000 mg | ORAL_TABLET | Freq: Every evening | ORAL | Status: DC | PRN

## 2015-02-18 MED ORDER — ACETAMINOPHEN 325 MG PO TABS
650.0000 mg | ORAL_TABLET | ORAL | Status: DC | PRN
Start: 2015-02-18 — End: 2015-02-26
  Administered 2015-02-19 – 2015-02-22 (×3): 650 mg via ORAL
  Filled 2015-02-18 (×3): qty 2

## 2015-02-18 MED ORDER — DOCUSATE SODIUM 100 MG PO CAPS
100.0000 mg | ORAL_CAPSULE | Freq: Every day | ORAL | Status: DC
  Administered 2015-02-19 – 2015-02-20 (×2): 100 mg via ORAL
  Filled 2015-02-18 (×4): qty 1

## 2015-02-18 MED ORDER — LACTATED RINGERS IV SOLN
INTRAVENOUS | Status: DC
  Administered 2015-02-18 – 2015-02-19 (×2): via INTRAVENOUS

## 2015-02-18 MED ORDER — NIFEDIPINE 10 MG PO CAPS
10.0000 mg | ORAL_CAPSULE | Freq: Once | ORAL | Status: AC
  Administered 2015-02-18: 10 mg via ORAL
  Filled 2015-02-18: qty 1

## 2015-02-18 MED ORDER — PRENATAL MULTIVITAMIN CH
1.0000 | ORAL_TABLET | Freq: Every day | ORAL | Status: DC
  Administered 2015-02-19 – 2015-02-26 (×8): 1 via ORAL
  Filled 2015-02-18 (×8): qty 1

## 2015-02-18 MED ORDER — CALCIUM CARBONATE ANTACID 500 MG PO CHEW: 2.0000 | CHEWABLE_TABLET | ORAL | Status: DC | PRN

## 2015-02-18 NOTE — MAU Note (Signed)
Debbie, RN in antenatal given report. Room 157 given for pt.

## 2015-02-18 NOTE — H&P (Signed)
Carrie Bean is a 29 y.o. female presenting for Bleeding, recurrent.  Has a known Subchorionic hemorrhage/abruption at the leading edge of the placenta. Was scheduled to have a followup [place] last week and did not go due to travel.  States has cramping also.   RN Note: . Patient presents at [redacted] weeks gestation with c/o abdominal pain and vaginal bleeding since 0715 this morning. States she noticed the bleeding while voiding this morning and the pain started immediately after the bleeding occurred. Patient states she has been on pelvic rest since 7/29 for subchorionic hemorrhage. Decreased fetal movement noted this morning as well. Denies discharge.          Maternal Medical History:  Reason for admission: Vaginal bleeding.  Nausea.  Contractions: Onset was 3-5 hours ago.   Frequency: regular.   Perceived severity is mild.    Fetal activity: Perceived fetal activity is normal.   Last perceived fetal movement was within the past hour.    Prenatal complications: Bleeding and placental abnormality.   No PIH, infection, IUGR, pre-eclampsia or preterm labor.   Prenatal Complications - Diabetes: none.    OB History    Gravida Para Term Preterm AB TAB SAB Ectopic Multiple Living   5 4 3 1  0 0 0 0 0 4     Past Medical History  Diagnosis Date  . Preterm labor   . Urinary tract infection   . Gonorrhea   . Chlamydia   . Abnormal Pap smear     f/u was normal   Past Surgical History  Procedure Laterality Date  . No past surgeries     Family History: family history is negative for Other and Hearing loss. Social History:  reports that she has never smoked. She has never used smokeless tobacco. She reports that she does not drink alcohol or use illicit drugs.   Prenatal Transfer Tool  Maternal Diabetes: No Genetic Screening: Declined Maternal Ultrasounds/Referrals: Abnormal:  Findings:   Other: Fetal Ultrasounds or other Referrals:  None Maternal Substance Abuse:   No Significant Maternal Medications:  None Significant Maternal Lab Results:  None Other Comments:  Two placental abruptions  Review of Systems  Constitutional: Negative for fever, chills and malaise/fatigue.  Gastrointestinal: Positive for abdominal pain (cramping). Negative for nausea, vomiting, diarrhea and constipation.  Genitourinary:       Vaginal bleeding, maybe a little watery fluid   Neurological: Negative for dizziness and weakness.    Dilation: 1 Effacement (%): Thick Station: Ballotable Exam by:: Wynelle Bourgeois, CNM Blood pressure 121/63, pulse 100, temperature 98.4 F (36.9 C), temperature source Oral, resp. rate 18, height 5\' 10"  (1.778 m), weight 157 lb 2 oz (71.271 kg), last menstrual period 08/28/2014, currently breastfeeding. Maternal Exam:  Uterine Assessment: Contraction strength is mild.  Contraction frequency is regular.   Abdomen: Patient reports no abdominal tenderness. Fundal height is 24.   Estimated fetal weight is 1.   Fetal presentation: vertex  Introitus: Normal vulva. Vulva is negative for lesion.  Vagina is positive for vaginal discharge (no pooling, discharge is mucousy and red/bloody).  Ferning test: negative.  Nitrazine test: not done. Amniotic fluid character: bloody.  Pelvis: adequate for delivery.   Cervix: Cervix evaluated by digital exam.     Fetal Exam Fetal Monitor Review: Mode: ultrasound.   Baseline rate: 150.  Variability: moderate (6-25 bpm).   Pattern: accelerations present and no decelerations.    Fetal State Assessment: Category I - tracings are normal.  Physical Exam  Constitutional: She is oriented to person, place, and time. She appears well-developed. No distress.  HENT:  Head: Normocephalic.  Cardiovascular: Normal rate and regular rhythm.   Respiratory: Effort normal. No respiratory distress.  GI: Soft. She exhibits no distension and no mass. There is no tenderness. There is no rebound and no guarding.   Uterine irritability noted  Genitourinary: Vulva exhibits no lesion. Vaginal discharge (no pooling, discharge is mucousy and red/bloody) found.  Dilation: 1 Effacement (%): Thick Station: Ballotable Exam by:: Wynelle Bourgeois, CNM   Musculoskeletal: Normal range of motion.  Neurological: She is alert and oriented to person, place, and time.  Skin: Skin is warm and dry. She is not diaphoretic.  Psychiatric: She has a normal mood and affect.    Prenatal labs: ABO, Rh: B/POS/-- (08/08 1053) Antibody: NEG (08/08 1053) Rubella: 2.42 (08/08 1053) RPR: NON REAC (08/08 1053)  HBsAg: NEGATIVE (08/08 1053)  HIV: NONREACTIVE (08/08 1053)  GBS:    Ultrasound ordered since she missed her follow up last week  Dr Claudean Severance called to inform me of new Subchorionic hemorrhage/abruption along fundal edge of anterior placenta (prior one is at leading edge).  He recommends admission, steroids and MgSO4  Assessment/Plan: A:  SIUP at [redacted]w[redacted]d        Vaginal bleeding in second trimester      Known subchorionic hemorrhage/abruption at leading edge of placenta      New SCH/abruption at fundal edge of placenta      Uterine irritability probably secondary to above  P:  Admit to antenatal unit      Consult with Dr Claudean Severance for recommendations of admission and steroids and MgSO4       Bedrest with brp       Betamethasone       Magnesium sultfate for neuroprotection                   [facility] 02/18/2015, 11:39 AM

## 2015-02-18 NOTE — MAU Note (Addendum)
Patient presents at [redacted] weeks gestation with c/o abdominal pain and vaginal bleeding since 0715 this morning. States she noticed the bleeding while voiding this morning and the pain started immediately after the bleeding occurred. Patient states she has been on pelvic rest since 7/29 for subchorionic hemorrhage. Decreased fetal movement noted this morning as well. Denies discharge.

## 2015-02-18 NOTE — MAU Note (Signed)
Spoke with Eunice Blase, Charity fundraiser. OK to bring pt at this time to assigned room 157.

## 2015-02-18 NOTE — MAU Note (Signed)
Pt. States she has a subchorionic hemorrhage. Pt. Has been on pelvic rest since 01/04/15. Pt. States she has not had any bleeding for 2 weeks but still wears a pad in her underwear. States that this am around 0715 went to the bathroom and noticed blood on her pad and also when she voided. Pt. States she began cramping after using the restroom. Pt. States her next appointment with the high risk clinic is Sept 22 and she did call today to be seen and they told her to come here. Pt. States she goes in weekly for 17p injection. Pt. Also notes baby movement has been less today but feels baby now since monitor has been applied. Pt. Also states she traveled this past weekend to [facility] via car but did not do any strenuous activity or anything else new.

## 2015-02-19 ENCOUNTER — Encounter (HOSPITAL_COMMUNITY): Payer: Self-pay | Admitting: *Deleted

## 2015-02-19 DIAGNOSIS — O4592 Premature separation of placenta, unspecified, second trimester: Secondary | ICD-10-CM

## 2015-02-19 DIAGNOSIS — Z3A24 24 weeks gestation of pregnancy: Secondary | ICD-10-CM

## 2015-02-19 NOTE — Progress Notes (Signed)
FACULTY PRACTICE ANTEPARTUM(COMPREHENSIVE) NOTE  Carrie Bean is a 29 y.o. [id number] at [redacted]w[redacted]d by early ultrasound who is admitted for vaginal bleeding with abruption.   Fetal presentation is cephalic. Length of Stay:  1  Days  Subjective:  Patient reports the fetal movement as active. Patient reports uterine contraction  activity as none. Patient reports  vaginal bleeding as less flow than a normal period. Patient describes fluid per vagina as None.  Vitals:  Blood pressure 112/74, pulse 96, temperature 98.2 F (36.8 C), temperature source Oral, resp. rate 16, height 5\' 10"  (1.778 m), weight 71.271 kg (157 lb 2 oz), last menstrual period 08/28/2014, currently breastfeeding. Physical Examination:  General appearance - alert, well appearing, and in no distress Heart - normal rate and regular rhythm Abdomen - soft, nontender, nondistended Fundal Height:  size equals dates Cervical Exam: Not evaluated.  Extremities: extremities normal, atraumatic, no cyanosis or edema and Homans sign is negative, no sign of DVT with DTRs 2+ bilaterally Membranes:intact  Fetal Monitoring:      Fetal Heart Rate A     Mode  External filed at 02/19/2015 0800    Baseline Rate (A)  125 bpm filed at 02/19/2015 0800    Variability  6-25 BPM filed at 02/19/2015 0800    Accelerations  15 x 15 filed at 02/19/2015 0800    Decelerations  None filed at 02/19/2015 0800        Labs:  Results for orders placed or performed during the hospital encounter of 02/18/15 (from the past 24 hour(s))  Urine rapid drug screen (hosp performed)not at [facility]   Collection Time: 02/18/15 10:08 AM  Result Value Ref Range   Opiates NONE DETECTED NONE DETECTED   Cocaine NONE DETECTED NONE DETECTED   Benzodiazepines NONE DETECTED NONE DETECTED   Amphetamines NONE DETECTED NONE DETECTED   Tetrahydrocannabinol NONE DETECTED NONE DETECTED   Barbiturates NONE DETECTED NONE DETECTED  Urinalysis, Routine w reflex  microscopic (not at [facility])   Collection Time: 02/18/15 10:11 AM  Result Value Ref Range   Color, Urine YELLOW YELLOW   APPearance CLEAR CLEAR   Specific Gravity, Urine >1.030 (H) 1.005 - 1.030   pH 6.0 5.0 - 8.0   Glucose, UA NEGATIVE NEGATIVE mg/dL   Hgb urine dipstick NEGATIVE NEGATIVE   Bilirubin Urine NEGATIVE NEGATIVE   Ketones, ur NEGATIVE NEGATIVE mg/dL   Protein, ur NEGATIVE NEGATIVE mg/dL   Urobilinogen, UA 0.2 0.0 - 1.0 mg/dL   Nitrite NEGATIVE NEGATIVE   Leukocytes, UA NEGATIVE NEGATIVE  CBC   Collection Time: 02/18/15  1:53 PM  Result Value Ref Range   WBC 8.0 4.0 - 10.5 K/uL   RBC 3.93 3.87 - 5.11 MIL/uL   Hemoglobin 9.6 (L) 12.0 - 15.0 g/dL   HCT 14.7 (L) 82.9 - 56.2 %   MCV 78.9 78.0 - 100.0 fL   MCH 24.4 (L) 26.0 - 34.0 pg   MCHC 31.0 30.0 - 36.0 g/dL   RDW 13.0 86.5 - 78.4 %   Platelets 247 150 - 400 K/uL  Type and screen   Collection Time: 02/18/15  1:53 PM  Result Value Ref Range   ABO/RH(D) B POS    Antibody Screen NEG    Sample Expiration 02/21/2015       Medications:  Scheduled . betamethasone acetate-betamethasone sodium phosphate  12 mg Intramuscular Q24H  . docusate sodium  100 mg Oral Daily  . prenatal multivitamin  1 tablet Oral Q1200   I have reviewed the  patient's current medications.  ASSESSMENT: Patient Active Problem List   Diagnosis Date Noted  . 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 Subchorionic hemorrhage, antepartum 01/15/2015  . Pregnancy with history of pre-term labor 04/21/2013  . Supervision of high-risk pregnancy 04/13/2013    PLAN: Magnesium for 24 hr, betamethasone 2 doses, observe for s/sx worsening bleeding/abruption  Raymont Andreoni 02/19/2015,9:31 AM

## 2015-02-20 NOTE — Progress Notes (Signed)
Pt states having cramps like going to have diarrhea   Pt states she is constipated

## 2015-02-20 NOTE — Progress Notes (Signed)
FACULTY PRACTICE ANTEPARTUM(COMPREHENSIVE) NOTE  Carrie Bean is a 29 y.o. [id number] at [redacted]w[redacted]d by early ultrasound who is admitted for vaginal bleeding, marginal .   Fetal presentation is cephalic. Length of Stay:  2  Days  Subjective:   Patient reports the fetal movement as active. Patient reports uterine contraction  activity as none. Patient reports  vaginal bleeding as spotting. Patient describes fluid per vagina as None.  Vitals:  Blood pressure 107/60, pulse 91, temperature 98.1 F (36.7 C), temperature source Oral, resp. rate 18, height  (1.778 m), weight 71.271 kg (157 lb 2 oz), last menstrual period 08/28/2014, currently breastfeeding. Physical Examination:  General appearance - alert, well appearing, and in no distress Heart - normal rate and regular rhythm Abdomen - soft, nontender, nondistended Fundal Height:  size equals dates Cervical Exam: Not evaluated.  Extremities: extremities normal, atraumatic, no cyanosis or edema and Homans sign is negative, no sign of DVT Membranes:intact  Fetal Monitoring    Fetal Heart Rate A      Mode  External filed at 02/19/2015 2156    Baseline Rate (A)  130 bpm [cardio removed] filed at 02/19/2015 2232    Variability  <5 BPM, 6-25 BPM filed at 02/19/2015 2232    Accelerations  15 x 15 filed at 02/19/2015 2232    Decelerations  None filed at 02/19/2015 2232        Labs:  No results found for this or any previous visit (from the past 24 hour(s)).  Imaging Studies:     Currently EPIC will not allow sonographic studies to automatically populate into notes.  In the meantime, copy and paste results into note or free text.  Medications:  Scheduled . docusate sodium  100 mg Oral Daily  . prenatal multivitamin  1 tablet Oral Q1200   I have reviewed the patient's current medications.  ASSESSMENT: Patient Active Problem List   Diagnosis Date Noted  . 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 Subchorionic hemorrhage,  antepartum 01/15/2015  . Pregnancy with history of pre-term labor 04/21/2013  . Supervision of high-risk pregnancy 04/13/2013    PLAN: Continue in hospital observation until after bleeding has stopped  Aroldo Galli 02/20/2015,7:32 AM

## 2015-02-21 ENCOUNTER — Ambulatory Visit: Payer: Medicaid Other

## 2015-02-21 LAB — TYPE AND SCREEN
ABO/RH(D): B POS
Antibody Screen: NEGATIVE

## 2015-02-21 NOTE — Progress Notes (Signed)
Patient ID: Carrie Bean, female   DOB: 06/20/1985, 29 y.o.   MRN: [id number] FACULTY PRACTICE ANTEPARTUM(COMPREHENSIVE) NOTE  Carrie Bean is a 29 y.o. [id number] at [redacted]w[redacted]d Estimated Date of Delivery: 06/04/15  early ultrasound who is admitted for vaginal bleeding, placental abruption x 2 seen on sonogram   Fetal presentation is cephalic. Length of Stay:  3 Days  Subjective:   Patient reports the fetal movement as active. Patient reports uterine contraction  activity as none. Patient reports  vaginal bleeding as spotting. Patient describes fluid per vagina as None.  Vitals:  Blood pressure 107/60, pulse 91, temperature 98.1 F (36.7 C), temperature source Oral, resp. rate 18, height  (1.778 m), weight 71.271 kg (157 lb 2 oz), last menstrual period 08/28/2014, currently breastfeeding. Physical Examination:  General appearance - alert, well appearing, and in no distress Heart - normal rate and regular rhythm Abdomen - soft, nontender, nondistended Fundal Height:  size equals dates Cervical Exam: Not evaluated.  Extremities: extremities normal, atraumatic, no cyanosis or edema and Homans sign is negative, no sign of DVT Membranes:intact  Fetal Monitoring    Fetal Heart Rate A      Mode  External filed at 02/19/2015 2156    Baseline Rate (A)  130 bpm [cardio removed] filed at 02/19/2015 2232    Variability  <5 BPM, 6-25 BPM filed at 02/19/2015 2232    Accelerations  15 x 15 filed at 02/19/2015 2232    Decelerations  None filed at 02/19/2015 2232        Labs:  No results found for this or any previous visit (from the past 24 hour(s)).  Imaging Studies:    4.5x3.6x2.5 cm abruption seen on sonogram   Medications:  Scheduled . docusate sodium  100 mg Oral Daily  . prenatal multivitamin  1 tablet Oral Q1200   I have reviewed the patient's current medications.  ASSESSMENT: [redacted]w[redacted]d Estimated Date of Delivery: 06/04/15  Patient Active Problem List   Diagnosis  Date Noted  . 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 Subchorionic hemorrhage, antepartum 01/15/2015  . Pregnancy with history of pre-term labor 04/21/2013  . Supervision of high-risk pregnancy 04/13/2013    PLAN: Continue in hospital observation until after bleeding has stopped S/P betamethasone x 2  Carrie Arms, MD 02/21/2015 7:29 AM

## 2015-02-22 MED ORDER — DOCUSATE SODIUM 100 MG PO CAPS
100.0000 mg | ORAL_CAPSULE | Freq: Two times a day (BID) | ORAL | Status: DC
  Administered 2015-02-22 – 2015-02-26 (×8): 100 mg via ORAL
  Filled 2015-02-22 (×9): qty 1

## 2015-02-22 MED ORDER — POLYETHYLENE GLYCOL 3350 17 G PO PACK
17.0000 g | PACK | Freq: Every day | ORAL | Status: DC | PRN
  Administered 2015-02-25: 17 g via ORAL
  Filled 2015-02-22: qty 1

## 2015-02-22 MED ORDER — SODIUM CHLORIDE 0.9 % IJ SOLN
3.0000 mL | Freq: Two times a day (BID) | INTRAMUSCULAR | Status: DC
  Administered 2015-02-23 – 2015-02-25 (×4): 3 mL via INTRAVENOUS

## 2015-02-22 MED ORDER — DOCUSATE SODIUM 100 MG PO CAPS: 100.0000 mg | ORAL_CAPSULE | Freq: Two times a day (BID) | ORAL | Status: DC | PRN

## 2015-02-22 MED ORDER — HYDROXYPROGESTERONE CAPROATE 250 MG/ML IM OIL
250.0000 mg | TOPICAL_OIL | INTRAMUSCULAR | Status: DC
  Administered 2015-02-22: 250 mg via INTRAMUSCULAR
  Filled 2015-02-22: qty 1

## 2015-02-22 NOTE — Progress Notes (Signed)
Patient ID: Carrie Bean, female   DOB: [birth year], 29 y.o.   MRN: [id number] FACULTY PRACTICE ANTEPARTUM(COMPREHENSIVE) NOTE  Carrie Bean is a 29 y.o. [id number] at [redacted]w[redacted]d Estimated Date of Delivery: 06/04/15  early ultrasound who is admitted for vaginal bleeding, placental abruption x 2 seen on sonogram   Fetal presentation is cephalic. Length of Stay:  4 Days  Subjective:  Patient reports dark staining on pad - no bleeding since 9/13. Patient reports the fetal movement as active. Patient reports uterine contraction  activity as none. Patient describes fluid per vagina as None.  Vitals:  Blood pressure 107/60, pulse 91, temperature 98.1 F (36.7 C), temperature source Oral, resp. rate 18, height  (1.778 m), weight 71.271 kg (157 lb 2 oz), last menstrual period 08/28/2014, currently breastfeeding. Physical Examination:  General appearance - alert, well appearing, and in no distress Heart - normal rate and regular rhythm Lungs - clear to ascultation.  Abdomen - soft, nontender, nondistended Fundal Height:  size equals dates Cervical Exam: Not evaluated.  Extremities: extremities normal, atraumatic, no cyanosis or edema and Homans sign is negative, no sign of DVT Membranes:intact  Fetal Monitoring    NST Baseling 145, moderate variability, 15x15 accelerations.  No decels      Labs:  No results found for this or any previous visit (from the past 24 hour(s)).  Imaging Studies:    4.5x3.6x2.5 cm abruption seen on sonogram   Medications:  Scheduled . docusate sodium  100 mg Oral Daily  . prenatal multivitamin  1 tablet Oral Q1200   I have reviewed the patient's current medications.  ASSESSMENT: [redacted]w[redacted]d Estimated Date of Delivery: 06/04/15  Patient Active Problem List   Diagnosis Date Noted  . 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 Subchorionic hemorrhage, antepartum 01/15/2015  . Pregnancy with history of pre-term labor 04/21/2013  . Supervision of high-risk pregnancy  04/13/2013    PLAN: #1  Placental abruption  Continue NST twice daily  NST reactive  S/p BMZ x2   Candelaria Celeste JEHIEL, DO 02/22/2015 6:39 AM

## 2015-02-22 NOTE — Progress Notes (Signed)
Dr Jolayne Panther called and updated about hydroxprogesterone injection.  Stated she will follow-up.

## 2015-02-23 ENCOUNTER — Encounter (HOSPITAL_COMMUNITY): Payer: Self-pay | Admitting: *Deleted

## 2015-02-23 NOTE — Progress Notes (Signed)
Patient ID: Carrie Bean, female   DOB: 07/23/1985, 29 y.o.   MRN: [id number] FACULTY PRACTICE ANTEPARTUM(COMPREHENSIVE) NOTE  Carrie Bean is a 29 y.o. [id number] at [redacted]w[redacted]d Estimated Date of Delivery: 06/04/15  early ultrasound who is admitted for vaginal bleeding, placental abruption x 2 seen on sonogram   Fetal presentation is cephalic. Length of Stay:  5 Days  Subjective:  Patient reports dark staining on pad - no bleeding since 9/13. Patient reports the fetal movement as active. Patient reports uterine contraction  activity as none. Patient describes fluid per vagina as None.  Vitals:  Blood pressure 107/60, pulse 91, temperature 98.1 F (36.7 C), temperature source Oral, resp. rate 18, height  (1.778 m), weight 71.271 kg (157 lb 2 oz), last menstrual period 08/28/2014, currently breastfeeding. Physical Examination:  General appearance - alert, well appearing, and in no distress Heart - normal rate and regular rhythm Lungs - clear to ascultation.  Abdomen - soft, nontender, gravid Fundal Height:  size equals dates Cervical Exam: Not evaluated.  Extremities: extremities normal, atraumatic, no cyanosis or edema and Homans sign is negative, no sign of DVT Membranes:intact  Fetal Monitoring    NST Baseling 140, moderate variability, 10x10 accelerations.  No decels Toco: no contractions      Labs:  No results found for this or any previous visit (from the past 24 hour(s)).  Imaging Studies:    4.5x3.6x2.5 cm abruption seen on sonogram   Medications:  Scheduled . docusate sodium  100 mg Oral Daily  . prenatal multivitamin  1 tablet Oral Q1200   I have reviewed the patient's current medications.  ASSESSMENT: [redacted]w[redacted]d Estimated Date of Delivery: 06/04/15  Patient Active Problem List   Diagnosis Date Noted  . 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 Subchorionic hemorrhage, antepartum 01/15/2015  . Pregnancy with history of pre-term labor 04/21/2013  . Supervision of  high-risk pregnancy 04/13/2013    PLAN: #1  Placental abruption  Continue NST twice daily  NST reactive  S/p BMZ x2  Continue monitoring for vaginal bleeding   CONSTANT,PEGGY, MD 02/23/2015 7:14 AM

## 2015-02-24 DIAGNOSIS — O4593 Premature separation of placenta, unspecified, third trimester: Secondary | ICD-10-CM

## 2015-02-24 LAB — TYPE AND SCREEN
ABO/RH(D): B POS
ANTIBODY SCREEN: NEGATIVE

## 2015-02-24 NOTE — Progress Notes (Signed)
Patient ID: Carrie Bean, female   DOB: [birth year], 29 y.o.   MRN: [id number] FACULTY PRACTICE ANTEPARTUM(COMPREHENSIVE) NOTE  Carrie Bean is a 29 y.o. [id number] at [redacted]w[redacted]d by best clinical estimate who is admitted for abruptio.   Fetal presentation is cephalic. Length of Stay:  6  Days  Subjective: Doing well.  No further bleeding Patient reports the fetal movement as active. Patient reports uterine contraction  activity as none. Patient reports  vaginal bleeding as scant staining. Patient describes fluid per vagina as None.  Vitals:  Blood pressure 98/44, pulse 86, temperature 98.2 F (36.8 C), temperature source Oral, resp. rate 18, height  (1.778 m), weight 153 lb 12.8 oz (69.763 kg), last menstrual period 08/28/2014, SpO2 99 %, currently breastfeeding. Physical Examination:  General appearance - alert, well appearing, and in no distress Chest - normal effort Abdomen - gravid, NT Fundal Height:  size equals dates. Extremities: extremities normal, atraumatic, no cyanosis or edema  Membranes:intact  Fetal Monitoring:  Baseline: 145 bpm, Variability: Good {> 6 bpm), Accelerations: Non-reactive but appropriate for gestational age and Decelerations: Absent  Medications:  Scheduled . docusate sodium  100 mg Oral BID  . hydroxyprogesterone caproate  250 mg Intramuscular Weekly  . prenatal multivitamin  1 tablet Oral Q1200  . sodium chloride  3 mL Intravenous Q12H   I have reviewed the patient's current medications.  ASSESSMENT: Patient Active Problem List   Diagnosis Date Noted  . Supervision of high-risk pregnancy 04/13/2013    Priority: High  . Subchorionic hemorrhage, antepartum 01/15/2015    Priority: Medium  . Pregnancy with history of pre-term labor 04/21/2013    Priority: Medium  . 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 S/p BMZ. U/s to f/u growth Continue 17 OHP  PRATT,TANYA S,  MD 02/24/2015,9:24 AM

## 2015-02-25 ENCOUNTER — Encounter: Payer: Self-pay | Admitting: *Deleted

## 2015-02-25 ENCOUNTER — Inpatient Hospital Stay (HOSPITAL_COMMUNITY): Payer: Medicaid Other

## 2015-02-25 NOTE — Progress Notes (Signed)
Patient ID: Carrie Bean, female   DOB: 1986/01/19, 29 y.o.   MRN: [id number] FACULTY PRACTICE ANTEPARTUM(COMPREHENSIVE) NOTE  DEEPTI GUNAWAN is a 29 y.o. M5H8469 at [redacted]w[redacted]d by best clinical estimate who is admitted for abruptio.   Fetal presentation is cephalic. Length of Stay:  7  Days  Subjective: Reports discharge but no bleeding Patient reports the fetal movement as active. Patient reports uterine contraction  activity as none. Patient reports  vaginal bleeding as none. Patient describes fluid per vagina as None.  Vitals:  Blood pressure 114/59, pulse 99, temperature 98.1 F (36.7 C), temperature source Oral, resp. rate 18, height  (1.778 m), weight 153 lb 12.8 oz (69.763 kg), last menstrual period 08/28/2014, SpO2 99 %, currently breastfeeding. Physical Examination:  General appearance - alert, well appearing, and in no distress Chest - normal effort Abdomen - gravid, NT Neurological - alert, oriented, normal speech, no focal findings or movement disorder noted Skin - normal coloration and turgor, no rashes, no suspicious skin lesions noted Fundal Height:  size equals dates Extremities: extremities normal, atraumatic, no cyanosis or edema  Membranes:intact  Fetal Monitoring:  Baseline: 145 bpm, Variability: Good {> 6 bpm), Accelerations: Non-reactive but appropriate for gestational age and Decelerations: Absent  Labs:  Results for orders placed or performed during the hospital encounter of 02/18/15 (from the past 24 hour(s))  Type and screen   Collection Time: 02/24/15  1:00 PM  Result Value Ref Range   ABO/RH(D) B POS    Antibody Screen NEG    Sample Expiration 02/27/2015     Medications:  Scheduled . docusate sodium  100 mg Oral BID  . hydroxyprogesterone caproate  250 mg Intramuscular Weekly  . prenatal multivitamin  1 tablet Oral Q1200  . sodium chloride  3 mL Intravenous Q12H   I have reviewed the patient's current medications.  ASSESSMENT: Patient  Active Problem List   Diagnosis Date Noted  . Supervision of high-risk pregnancy 04/13/2013    Priority: High  . Subchorionic hemorrhage, antepartum 01/15/2015    Priority: Medium  . Pregnancy with history of pre-term labor 04/21/2013    Priority: Medium  . Placenta abruptio 02/18/2015    PLAN: Keep in house until 7 days post last active bleed--which would be tomorrow. Wants to sign BTL papers--will have someone sign with her today.  Reva Bores, MD 02/25/2015,7:45 AM

## 2015-02-25 NOTE — Progress Notes (Signed)
Met patient to introduce spiritual care services.  Pt shared that she's been feeling very overwhelmed by guilt and sadness.  She reports she doesn't feel like she can really talk to anyone about her feelings because they're all so busy and her mother is already doing so much to help her.  She stated that she is also worried about her oldest son who is being bullied at school, but doesn't really open up.  She is hopeful that she may be discharged home for bedrest, but fears she'll be here until the end of her pregnancy.     02/25/15 1445  Clinical Encounter Type  Visited With Patient  Visit Type Initial;Spiritual support  Consult/Referral To Chaplain  Stress Factors  Patient Stress Factors Loss of control;Major life changes

## 2015-02-26 NOTE — Discharge Summary (Signed)
OB Discharge Summary     Patient Name: Carrie Bean DOB: February 23, 1986 MRN: [id number]  Date of admission: 02/18/2015  Date of discharge: 02/26/2015  Admitting diagnosis: 24WKS, BLEEDING,CRAMPING Intrauterine pregnancy: [redacted]w[redacted]d     Secondary diagnosis: placenta abruption     Discharge diagnosis: same                                                                                                Hospital course:  This is a 29 year old G5 P3 0104 at 58 weeks who was admitted on 02/18/2015 for vaginal bleeding with ultrasound evidence of abruption. Patient was hospitalized, given betamethasone 2 doses, and magnesium for neuro protection for approximately 12 hours. The magnesium was discontinued as delivery was not imminent and the vaginal bleeding diminished. She remains 7 days without bleeding, contractions, abdominal pain.  Patient is being discharged this morning to follow-up in the clinic in 2 days where she will continue her 17 hydroxy progesterone. Patient was instructed to return for any vaginal bleeding, leaking fluid, contractions, decreased fetal activity.  Physical exam  Filed Vitals:   02/25/15 0800 02/25/15 1931 02/25/15 1932 02/25/15 2312  BP: 97/55  119/62 139/71  Pulse: 90  103 104  Temp: 98.4 F (36.9 C) 98.2 F (36.8 C)  98.1 F (36.7 C)  TempSrc:  Oral  Oral  Resp: Height:      Weight:      SpO2:       General: alert, cooperative and no distress Heart: regular rate, no murmur Lungs: clear to auscultation bilaterally, no wheezing.  Abdomen: Gravid, size equals dates, soft, nontender, nondistended DVT Evaluation: No evidence of DVT seen on physical exam. Negative Homan's sign. No cords or calf tenderness. Labs: Lab Results  Component Value Date   WBC 8.0 02/18/2015   HGB 9.6* 02/18/2015   HCT 31.0* 02/18/2015   MCV 78.9 02/18/2015   PLT 247 02/18/2015   No flowsheet data found.  Discharge instruction: per After Visit Summary and "Baby and  Me Booklet".  Medications:  Current facility-administered medications:  .  acetaminophen (TYLENOL) tablet 650 mg, 650 mg, Oral, Q4H PRN, Aviva Signs, CNM, 650 mg at 02/22/15 2353 .  calcium carbonate (TUMS - dosed in mg elemental calcium) chewable tablet 400 mg of elemental calcium, 2 tablet, Oral, Q4H PRN, Aviva Signs, CNM .  docusate sodium (COLACE) capsule 100 mg, 100 mg, Oral, BID, Arabella Merles, CNM, 100 mg at 02/25/15 2205 .  hydroxyprogesterone caproate (DELALUTIN) 250 mg/mL injection 250 mg, 250 mg, Intramuscular, Weekly, Peggy Constant, MD, 250 mg at 02/22/15 1610 .  lactated ringers infusion, , Intravenous, Continuous, Aviva Signs, CNM, Stopped at 02/19/15 1210 .  polyethylene glycol (MIRALAX / GLYCOLAX) packet 17 g, 17 g, Oral, Daily PRN, Catalina Antigua, MD, 17 g at 02/25/15 2206 .  prenatal multivitamin tablet 1 tablet, 1 tablet, Oral, Q1200, Aviva Signs, CNM, 1 tablet at 02/25/15 1153 .  sodium chloride 0.9 % injection 3 mL, 3 mL, Intravenous, Q12H, Rhona Raider Stinson, DO, 3 mL at 02/25/15 2205 .  zolpidem (AMBIEN) tablet 5 mg, 5 mg, Oral, QHS PRN, Aviva Signs, CNM  Diet: routine diet  Activity: Advance as tolerated. Pelvic rest for 6 weeks.   Outpatient follow up: 02/28/2015   02/26/2015 Candelaria Celeste JEHIEL, DO

## 2015-02-26 NOTE — Discharge Instructions (Signed)
Placental Abruption °Your placenta is the organ that nourishes your unborn baby (fetus). Your baby gets his or her blood supply and nutrients through your placenta. It is your baby's life support system. It is attached to the inside of your uterus until after your baby is born.  °Placental abruption is when the placenta partly or completely separates from the uterus before your baby is born. This is rare, but it can happen any time after 20 weeks of pregnancy. A small separation may not cause problems, but a large separation may be dangerous for you and your baby. °CAUSES  °Most of the time the cause of a placental abruption is unknown. Though it is rare, a placental abruption can be caused by:  °· An abdominal injury.   °· The baby turning from a buttocks-first position (breech presentation) or a sideways position (transverse) to a headfirst position (cephalic).   °· Delivering the first of multiple babies (twins, triplets, or more).   °· Sudden loss of amniotic fluid (premature rupture of the membranes).   °· An abnormally short umbilical cord. °RISK FACTORS °Some risk factors make a placental abruption more likely, including: °· History of placental abruption. °· High blood pressure (hypertension). °· Smoking. °· Alcohol intake. °· Blood clotting problems. °· Too much amniotic fluid. °· Having had multiples (twins or triplets or more). °· Seizures and convulsions. °· Diabetes mellitus. °· Having had more than four children. °· Age 35 years or older. °· Illegal drug use. °· Injury to your abdomen. °SIGNS AND SYMPTOMS  °A small placental abruption may not cause symptoms. If you do have symptoms, they may include: °· Mild abdominal pain. °· Slight vaginal bleeding. °Symptoms of severe placental abruption depend on the size of the separation and the stage of pregnancy. Symptoms may include:  °· Sudden pain in your uterus. °· Abdominal pain. °· Vaginal bleeding. °· Tender uterus. °· Severe abdominal pain with  tenderness. °· Continual contractions of your uterus. °· Back pain. °· Weakness, light-headedness. °DIAGNOSIS  °Placental abruption is suspected when a pregnant woman develops sudden pain in her uterus. The health care provider will check whether the uterus is very tender, hard, and enlarging and whether the baby has an abnormal heart rate or rhythm. Ultrasonography (commonly called an ultrasound) will be done. Blood work will also be done to make sure that there are enough healthy red blood cells and that there are no clotting problems or signs of too much blood loss. °TREATMENT  °Placental abruption is usually an emergency. It requires treatment right away. Your treatment will depend on:  °· The amount of bleeding. °· Whether you or you baby are in distress. °· The stage of your pregnancy. °· The maturity of the baby. °Treatment for partial separation of the placenta is bed rest and close observation. You also may need a blood transfusion or to receive fluids through an IV tube. Treatment for complete placental separation is delivery of your baby. You may have a cesarean delivery if your baby is in distress. °HOME CARE INSTRUCTIONS  °· Only take medicines as directed by your health care provider. °· Arrange for help at home before and after you deliver the baby, especially if you had a cesarean delivery or lost a lot of blood. °· Get plenty of rest and sleep. °· Do not have sexual intercourse until your health care provider says it is okay. °· Do not use tampons or douche unless your health care provider says it is okay. °SEEK MEDICAL CARE IF: °· You   have light vaginal bleeding or spotting. °· You have any type of trauma, such as a fall or jolt during an accident. °· You are having trouble avoiding drugs, alcohol, or smoking. °SEEK IMMEDIATE MEDICAL CARE IF: °· You have vaginal bleeding. °· You have abdominal pain. °· You have continuous uterine contractions. °· You have a hard, tender uterus. °· You do not feel  the baby move, or the baby moves very little. °MAKE SURE YOU: °· Understand these instructions. °· Will watch your condition. °· Will get help right away if you are not doing well or get worse. °Document Released: 05/25/2005 Document Revised: 05/30/2013 Document Reviewed: 03/17/2013 °ExitCare® Patient Information ©2015 [employer]. This information is not intended to replace advice given to you by your health care provider. Make sure you discuss any questions you have with your health care provider. ° °

## 2015-02-28 ENCOUNTER — Ambulatory Visit (INDEPENDENT_AMBULATORY_CARE_PROVIDER_SITE_OTHER): Payer: Medicaid Other | Admitting: Obstetrics & Gynecology

## 2015-02-28 VITALS — BP 109/64 | HR 89 | Temp 98.1°F | Wt 159.6 lb

## 2015-02-28 DIAGNOSIS — O4590 Premature separation of placenta, unspecified, unspecified trimester: Secondary | ICD-10-CM | POA: Diagnosis not present

## 2015-02-28 DIAGNOSIS — O468X9 Other antepartum hemorrhage, unspecified trimester: Secondary | ICD-10-CM

## 2015-02-28 DIAGNOSIS — O09212 Supervision of pregnancy with history of pre-term labor, second trimester: Secondary | ICD-10-CM | POA: Diagnosis not present

## 2015-02-28 DIAGNOSIS — O4592 Premature separation of placenta, unspecified, second trimester: Secondary | ICD-10-CM

## 2015-02-28 DIAGNOSIS — O418X90 Other specified disorders of amniotic fluid and membranes, unspecified trimester, not applicable or unspecified: Secondary | ICD-10-CM

## 2015-02-28 NOTE — Progress Notes (Signed)
Breastfeeding tip of the week reviewed 17 P injection today Patient has not provided urine

## 2015-02-28 NOTE — Progress Notes (Signed)
Subjective:  Carrie Bean is a 29 y.o. Z6X0960 at [redacted]w[redacted]d being seen today for ongoing prenatal care.  Patient reports occasional contractions.  Contractions: Irregular.  Vag. Bleeding: None. Movement: Present. Denies leaking of fluid.   The following portions of the patient's history were reviewed and updated as appropriate: allergies, current medications, past family history, past medical history, past social history, past surgical history and problem list.   Objective:   Filed Vitals:   02/28/15 1048  BP: 109/64  Pulse: 89  Temp: 98.1 F (36.7 C)  Weight: 159 lb 9.6 oz (72.394 kg)    Fetal Status: Fetal Heart Rate (bpm): 135 Fundal Height: 26 cm Movement: Present     General:  Alert, oriented and cooperative. Patient is in no acute distress.  Skin: Skin is warm and dry. No rash noted.   Cardiovascular: Normal heart rate noted  Respiratory: Normal respiratory effort, no problems with respiration noted  Abdomen: Soft, gravid, appropriate for gestational age. Pain/Pressure: Present     Pelvic: Vag. Bleeding: None     Cervical exam deferred        Extremities: Normal range of motion.  Edema: None  Mental Status: Normal mood and affect. Normal behavior. Normal judgment and thought content.   Urinalysis: Urine Protein: Negative Urine Glucose: Negative  Assessment and Plan:  Pregnancy: A5W0981 at [redacted]w[redacted]d  1. Placenta abruptio, second trimester/Subchorionic hemorrhage, antepartum Will start weekly BPP at 28 weeks, 2x/week testing at 32 weeks.  Last scan on 02/25/15 showed Anterior placenta with [facility] of 8 cm x 3.4 x 1.4 cm.  Bleeding precautions reviewed. - US FETAL BPP WO NON STRESS; Standing  2. Pregnancy with history of pre-term labor, second trimester Continue weekly 17P Preterm labor symptoms and general obstetric precautions including but not limited to vaginal bleeding, contractions, leaking of fluid and fetal movement were reviewed in detail with the patient. Please refer to  After Visit Summary for other counseling recommendations.  Return in about 1 week (around 03/07/2015) for for 17P only. 2 weeks - BPP, 1 hr GTT, 3rd trimester labs, 17P, OB Visit.   Tereso Newcomer, MD

## 2015-02-28 NOTE — Patient Instructions (Signed)
Return to clinic for any obstetric concerns or go to MAU for evaluation  

## 2015-02-28 NOTE — Progress Notes (Signed)
BPP scheduled for 03/14/2015 :00am

## 2015-03-07 ENCOUNTER — Ambulatory Visit (INDEPENDENT_AMBULATORY_CARE_PROVIDER_SITE_OTHER): Payer: Medicaid Other | Admitting: *Deleted

## 2015-03-07 DIAGNOSIS — O09212 Supervision of pregnancy with history of pre-term labor, second trimester: Secondary | ICD-10-CM | POA: Diagnosis present

## 2015-03-14 ENCOUNTER — Encounter (HOSPITAL_COMMUNITY): Payer: Self-pay

## 2015-03-14 ENCOUNTER — Ambulatory Visit (INDEPENDENT_AMBULATORY_CARE_PROVIDER_SITE_OTHER): Payer: Medicaid Other | Admitting: Family

## 2015-03-14 ENCOUNTER — Ambulatory Visit: Payer: Medicaid Other

## 2015-03-14 ENCOUNTER — Ambulatory Visit (HOSPITAL_COMMUNITY)
Admission: RE | Admit: 2015-03-14 | Discharge: 2015-03-14 | Disposition: A | Discharge status: Home / Self Care | Payer: Medicaid Other | Source: Ambulatory Visit | Attending: Maternal and Fetal Medicine | Admitting: Maternal and Fetal Medicine

## 2015-03-14 ENCOUNTER — Other Ambulatory Visit (HOSPITAL_COMMUNITY): Payer: Self-pay | Admitting: Maternal and Fetal Medicine

## 2015-03-14 VITALS — BP 114/68 | HR 89 | Wt 162.0 lb

## 2015-03-14 VITALS — BP 113/58 | HR 80 | Temp 97.6°F | Wt 162.0 lb

## 2015-03-14 DIAGNOSIS — O4592 Premature separation of placenta, unspecified, second trimester: Secondary | ICD-10-CM | POA: Diagnosis not present

## 2015-03-14 DIAGNOSIS — O09892 Supervision of other high risk pregnancies, second trimester: Secondary | ICD-10-CM

## 2015-03-14 DIAGNOSIS — O418X90 Other specified disorders of amniotic fluid and membranes, unspecified trimester, not applicable or unspecified: Secondary | ICD-10-CM

## 2015-03-14 DIAGNOSIS — Z3A24 24 weeks gestation of pregnancy: Secondary | ICD-10-CM

## 2015-03-14 DIAGNOSIS — O09292 Supervision of pregnancy with other poor reproductive or obstetric history, second trimester: Secondary | ICD-10-CM

## 2015-03-14 DIAGNOSIS — O09212 Supervision of pregnancy with history of pre-term labor, second trimester: Secondary | ICD-10-CM | POA: Insufficient documentation

## 2015-03-14 DIAGNOSIS — Z3483 Encounter for supervision of other normal pregnancy, third trimester: Secondary | ICD-10-CM | POA: Diagnosis present

## 2015-03-14 DIAGNOSIS — O468X9 Other antepartum hemorrhage, unspecified trimester: Secondary | ICD-10-CM

## 2015-03-14 DIAGNOSIS — Z23 Encounter for immunization: Secondary | ICD-10-CM

## 2015-03-14 DIAGNOSIS — O09219 Supervision of pregnancy with history of pre-term labor, unspecified trimester: Secondary | ICD-10-CM

## 2015-03-14 DIAGNOSIS — O0992 Supervision of high risk pregnancy, unspecified, second trimester: Secondary | ICD-10-CM

## 2015-03-14 DIAGNOSIS — Z3493 Encounter for supervision of normal pregnancy, unspecified, third trimester: Secondary | ICD-10-CM

## 2015-03-14 LAB — CBC
HCT: 32.7 % — ABNORMAL LOW (ref 36.0–46.0)
Hemoglobin: 10.5 g/dL — ABNORMAL LOW (ref 12.0–15.0)
MCH: 24.5 pg — ABNORMAL LOW (ref 26.0–34.0)
MCHC: 32.1 g/dL (ref 30.0–36.0)
MCV: 76.2 fL — ABNORMAL LOW (ref 78.0–100.0)
MPV: 10 fL (ref 8.6–12.4)
PLATELETS: 254 10*3/uL (ref 150–400)
RBC: 4.29 MIL/uL (ref 3.87–5.11)
RDW: 16.5 % — AB (ref 11.5–15.5)
WBC: 6.4 10*3/uL (ref 4.0–10.5)

## 2015-03-14 MED ORDER — TETANUS-DIPHTH-ACELL PERTUSSIS 5-2.5-18.5 LF-MCG/0.5 IM SUSP
0.5000 mL | Freq: Once | INTRAMUSCULAR | Status: AC
  Administered 2015-03-14: 0.5 mL via INTRAMUSCULAR

## 2015-03-14 NOTE — Progress Notes (Signed)
Breastfeeding tip of the week reviewed BPP today 1 hour gtt with 28 wk labs Tdap vaccine/17P injection

## 2015-03-14 NOTE — Progress Notes (Signed)
Subjective:  Carrie Bean is a 29 y.o. Z6X0960 at [redacted]w[redacted]d being seen today for ongoing prenatal care.  Patient reports pelvic pressure with walking.  Max 5-6 contractions per day..  Contractions: Irritability.  Vag. Bleeding: None. Movement: Present. Denies leaking of fluid.   The following portions of the patient's history were reviewed and updated as appropriate: allergies, current medications, past family history, past medical history, past social history, past surgical history and problem list.   Objective:   Filed Vitals:   03/14/15 1035  BP: 113/58  Pulse: 80  Temp: 97.6 F (36.4 C)  Weight: 162 lb (73.483 kg)    Fetal Status: Fetal Heart Rate (bpm): 127 Fundal Height: 29 cm Movement: Present     General:  Alert, oriented and cooperative. Patient is in no acute distress.  Skin: Skin is warm and dry. No rash noted.   Cardiovascular: Normal heart rate noted  Respiratory: Normal respiratory effort, no problems with respiration noted  Abdomen: Soft, gravid, appropriate for gestational age. Pain/Pressure: Present     Pelvic: Vag. Bleeding: None Vag D/C Character: Mucous   Cervical exam deferred        Extremities: Normal range of motion.  Edema: None  Mental Status: Normal mood and affect. Normal behavior. Normal judgment and thought content.   Urinalysis:      Assessment and Plan:  Pregnancy: A5W0981 at [redacted]w[redacted]d  1. Prenatal care in third trimester - Glucose Tolerance, 1 HR (50g) w/o Fasting - CBC - RPR - HIV antibody (with reflex) - Tdap (BOOSTRIX) injection 0.5 mL; Inject 0.5 mLs into the muscle once.  2. Placenta Abruptio - BPP 8/8 today; weekly BPP and begin fetal testing at 32 wks - Bleeding precautions - Growth ultrasound in one week  Preterm labor symptoms and general obstetric precautions including but not limited to vaginal bleeding, contractions, leaking of fluid and fetal movement were reviewed in detail with the patient. Please refer to After Visit Summary  for other counseling recommendations.  Return in about 1 week (around 03/21/2015) for 17pweekly; 2 wk appt.   Eino Farber Kennith Gain, CNM

## 2015-03-15 LAB — HIV ANTIBODY (ROUTINE TESTING W REFLEX): HIV: NONREACTIVE

## 2015-03-15 LAB — RPR

## 2015-03-15 LAB — GLUCOSE TOLERANCE, 1 HOUR (50G) W/O FASTING: GLUCOSE 1 HOUR GTT: 110 mg/dL (ref 70–140)

## 2015-03-21 ENCOUNTER — Other Ambulatory Visit (HOSPITAL_COMMUNITY)
Admission: RE | Admit: 2015-03-21 | Discharge: 2015-03-21 | Disposition: A | Discharge status: Home / Self Care | Payer: Medicaid Other | Source: Ambulatory Visit | Attending: Family Medicine | Admitting: Family Medicine

## 2015-03-21 ENCOUNTER — Ambulatory Visit (INDEPENDENT_AMBULATORY_CARE_PROVIDER_SITE_OTHER): Payer: Medicaid Other | Admitting: Family Medicine

## 2015-03-21 VITALS — BP 106/60 | HR 84 | Temp 98.3°F

## 2015-03-21 DIAGNOSIS — O26893 Other specified pregnancy related conditions, third trimester: Secondary | ICD-10-CM

## 2015-03-21 DIAGNOSIS — O09213 Supervision of pregnancy with history of pre-term labor, third trimester: Secondary | ICD-10-CM

## 2015-03-21 DIAGNOSIS — N898 Other specified noninflammatory disorders of vagina: Secondary | ICD-10-CM | POA: Insufficient documentation

## 2015-03-21 DIAGNOSIS — Z113 Encounter for screening for infections with a predominantly sexual mode of transmission: Secondary | ICD-10-CM | POA: Insufficient documentation

## 2015-03-21 DIAGNOSIS — O0992 Supervision of high risk pregnancy, unspecified, second trimester: Secondary | ICD-10-CM

## 2015-03-21 DIAGNOSIS — O26899 Other specified pregnancy related conditions, unspecified trimester: Secondary | ICD-10-CM

## 2015-03-21 DIAGNOSIS — O468X9 Other antepartum hemorrhage, unspecified trimester: Secondary | ICD-10-CM

## 2015-03-21 DIAGNOSIS — O418X90 Other specified disorders of amniotic fluid and membranes, unspecified trimester, not applicable or unspecified: Secondary | ICD-10-CM

## 2015-03-21 NOTE — Progress Notes (Signed)
PT in to self collect wet prep and gc/clamydia

## 2015-03-21 NOTE — Patient Instructions (Signed)

## 2015-03-21 NOTE — Progress Notes (Signed)
Subjective:  Carrie Bean is a 29 y.o. Z6X0960G5P3104 at 3767w2d being seen today for ongoing prenatal care.  Patient reports vaginal irritation.   .   .  . Denies leaking of fluid.   The following portions of the patient's history were reviewed and updated as appropriate: allergies, current medications, past family history, past medical history, past social history, past surgical history and problem list. Problem list updated.  Objective:   Filed Vitals:   03/21/15 1124  BP: 106/60  Pulse: 84  Temp: 98.3 F (36.8 C)    Fetal Status:           General:  Alert, oriented and cooperative. Patient is in no acute distress.  Skin: Skin is warm and dry. No rash noted.   Cardiovascular: Normal heart rate noted  Respiratory: Normal respiratory effort, no problems with respiration noted  Abdomen: Soft, gravid, appropriate for gestational age.       Pelvic:       Cervical exam deferred        Extremities: Normal range of motion.     Mental Status: Normal mood and affect. Normal behavior. Normal judgment and thought content.   Urinalysis:      Assessment and Plan:  Pregnancy: A5W0981G5P3104 at 6667w2d  1. Supervision of high-risk pregnancy, second trimester - GC/Chlamydia probe amp ([facility])not at [facility]  2. Subchorionic hemorrhage, antepartum  3. Pregnancy with history of pre-term labor, third trimester 17 P today  4. Vaginal discharge during pregnancy, third trimester Wet prep and GC CT self collected - Wet prep, genital - GC[facility]Chlamydia probe amp [facility]   Preterm labor symptoms and general obstetric precautions including but not limited to vaginal bleeding, contractions, leaking of fluid and fetal movement were reviewed in detail with the patient. Please refer to After Visit Summary for other counseling recommendations.  Return in about 2 weeks (around 04/04/2015) for Routine prenatal care.   Federico FlakeKimberly Niles Lavoy Bernards, MD

## 2015-03-22 ENCOUNTER — Ambulatory Visit (HOSPITAL_COMMUNITY)
Admission: RE | Admit: 2015-03-22 | Discharge: 2015-03-22 | Disposition: A | Discharge status: Home / Self Care | Payer: Medicaid Other | Source: Ambulatory Visit | Attending: Family | Admitting: Family

## 2015-03-22 ENCOUNTER — Ambulatory Visit: Payer: Medicaid Other

## 2015-03-22 ENCOUNTER — Other Ambulatory Visit (HOSPITAL_COMMUNITY): Payer: Self-pay | Admitting: Obstetrics and Gynecology

## 2015-03-22 VITALS — BP 125/71 | HR 90 | Wt 163.1 lb

## 2015-03-22 DIAGNOSIS — Z3A29 29 weeks gestation of pregnancy: Secondary | ICD-10-CM | POA: Insufficient documentation

## 2015-03-22 DIAGNOSIS — O09213 Supervision of pregnancy with history of pre-term labor, third trimester: Secondary | ICD-10-CM | POA: Diagnosis present

## 2015-03-22 DIAGNOSIS — O468X9 Other antepartum hemorrhage, unspecified trimester: Secondary | ICD-10-CM

## 2015-03-22 DIAGNOSIS — O09893 Supervision of other high risk pregnancies, third trimester: Secondary | ICD-10-CM

## 2015-03-22 DIAGNOSIS — O418X90 Other specified disorders of amniotic fluid and membranes, unspecified trimester, not applicable or unspecified: Secondary | ICD-10-CM

## 2015-03-22 DIAGNOSIS — O09219 Supervision of pregnancy with history of pre-term labor, unspecified trimester: Secondary | ICD-10-CM

## 2015-03-22 DIAGNOSIS — O4693 Antepartum hemorrhage, unspecified, third trimester: Secondary | ICD-10-CM | POA: Diagnosis not present

## 2015-03-22 DIAGNOSIS — Z3A32 32 weeks gestation of pregnancy: Secondary | ICD-10-CM

## 2015-03-22 DIAGNOSIS — N898 Other specified noninflammatory disorders of vagina: Secondary | ICD-10-CM

## 2015-03-22 DIAGNOSIS — O26893 Other specified pregnancy related conditions, third trimester: Secondary | ICD-10-CM

## 2015-03-22 DIAGNOSIS — O4692 Antepartum hemorrhage, unspecified, second trimester: Secondary | ICD-10-CM

## 2015-03-22 LAB — WET PREP, GENITAL: TRICH WET PREP: NONE SEEN

## 2015-03-22 LAB — GC/CHLAMYDIA PROBE AMP (~~LOC~~) NOT AT ARMC
Chlamydia: NEGATIVE
Neisseria Gonorrhea: NEGATIVE

## 2015-03-22 NOTE — ED Notes (Signed)
Patient states she is having increased pelvic pressure and increase in contractions over the last 24 hours.  She has also noticed an increase in brownish/thick vaginal discharge.

## 2015-03-26 ENCOUNTER — Encounter (HOSPITAL_COMMUNITY): Payer: Self-pay | Admitting: *Deleted

## 2015-03-26 ENCOUNTER — Inpatient Hospital Stay (HOSPITAL_COMMUNITY)
Admission: AD | Admit: 2015-03-26 | Discharge: 2015-03-26 | Disposition: A | Discharge status: Home / Self Care | Payer: Medicaid Other | Source: Ambulatory Visit | Attending: Obstetrics & Gynecology | Admitting: Obstetrics & Gynecology

## 2015-03-26 DIAGNOSIS — Z3A30 30 weeks gestation of pregnancy: Secondary | ICD-10-CM | POA: Insufficient documentation

## 2015-03-26 DIAGNOSIS — R102 Pelvic and perineal pain: Secondary | ICD-10-CM | POA: Insufficient documentation

## 2015-03-26 DIAGNOSIS — N898 Other specified noninflammatory disorders of vagina: Secondary | ICD-10-CM

## 2015-03-26 DIAGNOSIS — O26893 Other specified pregnancy related conditions, third trimester: Secondary | ICD-10-CM | POA: Diagnosis not present

## 2015-03-26 DIAGNOSIS — O418X90 Other specified disorders of amniotic fluid and membranes, unspecified trimester, not applicable or unspecified: Secondary | ICD-10-CM

## 2015-03-26 DIAGNOSIS — O468X9 Other antepartum hemorrhage, unspecified trimester: Secondary | ICD-10-CM

## 2015-03-26 LAB — URINALYSIS, ROUTINE W REFLEX MICROSCOPIC
Bilirubin Urine: NEGATIVE
GLUCOSE, UA: NEGATIVE mg/dL
HGB URINE DIPSTICK: NEGATIVE
KETONES UR: NEGATIVE mg/dL
Leukocytes, UA: NEGATIVE
Nitrite: NEGATIVE
PROTEIN: NEGATIVE mg/dL
Specific Gravity, Urine: >1.03 — ABNORMAL HIGH (ref 1.005–1.030)
Urobilinogen, UA: 1 mg/dL (ref 0.0–1.0)
pH: 6 (ref 5.0–8.0)

## 2015-03-26 LAB — WET PREP, GENITAL
Clue Cells Wet Prep HPF POC: NONE SEEN
Trich, Wet Prep: NONE SEEN
Yeast Wet Prep HPF POC: NONE SEEN

## 2015-03-26 NOTE — MAU Note (Addendum)
PT  SAYS SHE  GETS PNC - [facility]-  LAST Thursday- GOT  17- P SHOT.  WHILE  THERE     WAS  SEEN  FOR  PRESSURE   AND SHARP PAIN IN HER  VAG  AND  INCREASE IN D/C-   BROWN.       THEN ON Sunday  AT 4 PM - WOKE  IN PAIN-    WENT  TO [facility] -  [facility],   [facility]-  D/C HOME.       SHE  STILL HAS  VAG  D/C  AND   PAIN IN HER  VAGINA .      PAIN IS  SLIGHT  LESS  THAN LAST Thursday.  HURTS TO WIPE  AFTER  SHE VOIDS .   LAST SEX-   July.

## 2015-03-26 NOTE — Discharge Instructions (Signed)
Ms. Barbette MerinoBreeden, today we checked for infections that could be causing your vaginal pain and discharge. A sample we took came back negative for BV, trich, and yeast infections. Your urine was also negative for infection. A recent gonorrhea and chlamydia test was negative. We repeated this today and will contact you if the result is positive. You are not having contractions, and your cervix is closed -- signs that you are not in labor. It is also reassuring that you do not have tenderness when your abdomen is touched and that no blood was seen in your vagina on exam.   For pain, try tylenol. If you continue to have discomfort, please let your doctor know at your next visit.  Please seek medical attention if you have regular contractions, do not feel your baby moving every 3-4 hours, or have loss of fluid or blood from your vagina.  Preterm Labor Information Preterm labor is when labor starts before you are [redacted] weeks pregnant. The normal length of pregnancy is 39 to 41 weeks.  CAUSES  The cause of preterm labor is not often known. The most common known cause is infection. RISK FACTORS  Having a history of preterm labor.  Having your water break before it should.  Having a placenta that covers the opening of the cervix.  Having a placenta that breaks away from the uterus.  Having a cervix that is too weak to hold the baby in the uterus.  Having too much fluid in the amniotic sac.  Taking drugs or smoking while pregnant.  Not gaining enough weight while pregnant.  Being younger than 1218 and older than 27 years old.  Having a low income.  Being African American. SYMPTOMS  Period-like cramps, belly (abdominal) pain, or back pain.  Contractions that are regular, as often as six in an hour. They may be mild or painful.  Contractions that start at the top of the belly. They then move to the lower belly and back.  Lower belly pressure that seems to get stronger.  Bleeding from the  vagina.  Fluid leaking from the vagina. TREATMENT  Treatment depends on:  Your condition.  The condition of your baby.  How many weeks pregnant you are. Your doctor may have you:  Take medicine to stop contractions.  Stay in bed except to use the restroom (bed rest).  Stay in the hospital. WHAT SHOULD YOU DO IF YOU THINK YOU ARE IN PRETERM LABOR? Call your doctor right away. You need to go to the hospital right away.  HOW CAN YOU PREVENT PRETERM LABOR IN FUTURE PREGNANCIES?  Stop smoking, if you smoke.  Maintain healthy weight gain.  Do not take drugs or be around chemicals that are not needed.  Tell your doctor if you think you have an infection.  Tell your doctor if you had a preterm labor before.   This information is not intended to replace advice given to you by your health care provider. Make sure you discuss any questions you have with your health care provider.   Document Released: 08/21/2008 Document Revised: 10/09/2014 Document Reviewed: 06/27/2012 Elsevier Interactive Patient Education [employer]2016 [employer]

## 2015-03-26 NOTE — MAU Provider Note (Signed)
History   CSN: [id number]  Arrival date and time: 03/26/15 2120   First Provider Initiated Contact with Patient 03/26/15 2217     Chief Complaint  Patient presents with  . Vaginal Pain  . Vaginal Discharge   HPI  Patient is a 29 yo G5P3104 at [redacted]w[redacted]d who presents with vaginal pain and discharge since last week. She is followed in high risk clinic for subchorionic hemorrhage. Her pain is a 7 out of 10 on the pain scale and usually feels like constant pressure/soreness in her vagina. She also has pain when she wipes after urination. She became more concerned when she had a sharp pain in her abdomen that woke her from sleep on Sunday. However, this quickly resolved. She states she has discharge "all the time" that is sometimes thick and sometimes watery but different from her normal discharge that is clear. She was seen in clinic on 03/21/15 to receive her 17-P shot (history of preterm labor for first pregnancy) and also talked to a provider about her current symptoms. A wet prep and GC/Chlamydia testing was performed, but she was not aware of any lab results from that day. She also reports that she was evaluated at an ED in [facility] 03/25/15 for pain and increased discharge and was told she was dehydrated and was given juice. She denies dysuria. She has brownish discharge daily but has not had frank vaginal bleeding. She denies recent sexual intercourse. She denies contractions and endorses sensing frequent fetal movement.   OB History    Gravida Para Term Preterm AB TAB SAB Ectopic Multiple Living   0 0 0 0 0 4      Past Medical History  Diagnosis Date  . Preterm labor   . Urinary tract infection   . Gonorrhea   . Chlamydia   . Abnormal Pap smear     f/u was normal  . Anemia     Past Surgical History  Procedure Laterality Date  . No past surgeries      Family History  Problem Relation Age of Onset  . Other Neg Hx   . Hearing loss Neg Hx   . Hyperlipidemia Mother   .  Asthma Daughter   . Asthma Son   . Hypertension Maternal Grandmother   . Stroke Maternal Grandmother     Social History  Substance Use Topics  . Smoking status: Never Smoker   . Smokeless tobacco: Never Used  . Alcohol Use: No    Allergies: No Known Allergies  Facility-administered medications prior to admission  Medication Dose Route Frequency Provider Last Rate Last Dose  . hydroxyprogesterone caproate (DELALUTIN) 250 mg/mL injection 250 mg  250 mg Intramuscular Weekly Reva Bores, MD   250 mg at 03/14/15 1153   Prescriptions prior to admission  Medication Sig Dispense Refill Last Dose  . Prenatal Vit-Fe Fumarate-FA (PRENATAL COMPLETE) 14-0.4 MG TABS Take 14 mg by mouth daily. 60 each 4 03/26/2015 at 1200    Review of Systems  Constitutional: Negative for fever and chills.  Cardiovascular: Negative for chest pain.  Gastrointestinal: Positive for abdominal pain (resolved). Negative for nausea, vomiting and diarrhea.  Genitourinary: Negative for dysuria.  Skin: Negative for rash.  Neurological: Negative for dizziness and headaches.   Physical Exam   Blood pressure 112/59, pulse 97, temperature 98.1 F (36.7 C), temperature source Oral, resp. rate 18, height  (1.727 m), weight 74.39 kg (164 lb), last menstrual period 08/28/2014, currently breastfeeding.  Physical Exam  Constitutional: She is oriented to person, place, and time. She appears well-developed and well-nourished. No distress.  HENT:  Head: Normocephalic.  Cardiovascular: Normal heart sounds.  Exam reveals no gallop and no friction rub.   No murmur heard. Respiratory: Effort normal and breath sounds normal. No respiratory distress.  GI: She exhibits mass (gravid). There is no tenderness.  Musculoskeletal: She exhibits no edema or tenderness.  Neurological: She is alert and oriented to person, place, and time.  GU: On speculum exam, cervix was visually closed. Thick white discharge was present.   MAU  Course  Procedures  MDM  FHR and toco were monitored. Fetal strip was reactive. No contractions were captured.  UA was obtained to assess for UTI.  Wet prep and gc/chlamydia probe were obtained to assess for infections that could be contributing to increased discharge and vaginal pain.  Labs from 10/13: Wet prep - few yeast, few clue cells GC/chlamydia - negative   Assessment and Plan  Patient presented with week-long history of vaginal soreness and increased discharge. Low suspicion for placental abruption, as exam was negative for abdominal tenderness and vaginal bleeding; FHT was reactive; toco was negative for contractions and patient denies abdominal cramping. UA was negative for UTI, wet prep was negative for yeast, BV and trich. GC/chlamydia probe pending but likely negative given recent negative result and negative history of recent sexual activity. Suspect vaginal pain is secondary to pressure from progression of pregnancy. Increased discharge unlikely to be due to infection.   -Discharged home with pre-term labor and return precautions (regular contractions, loss of fluid, vaginal bleeding). -No medications prescribed. Recommended Tylenol for pain. -Counseled on kick counts.   Future Appointments Date Time Provider Department Center  03/28/2015 10:05 AM Marlis EdelsonWalidah N Karim, CNM [facility]  03/29/2015 9:30 AM [facility] US 5 [facility]-US 203  04/05/2015 9:30 AM [facility] US 5 [facility]-US 203  04/12/2015 9:30 AM [facility] US 2 [facility]-US 203  04/19/2015 10:30 AM [facility] US 2 WH-US 203   Hillary Vernie AmmonsM Fitzgerald, MD [facility], PGY-1 03/26/2015, 11:26 PM    OB FELLOW MAU DISCHARGE ATTESTATION  I have seen and examined this patient; I agree with above documentation in the resident's note.    Silvano BilisNoah B Reinhardt Licausi, MD 3:59 AM

## 2015-03-27 LAB — GC/CHLAMYDIA PROBE AMP (~~LOC~~) NOT AT ARMC
Chlamydia: NEGATIVE
Neisseria Gonorrhea: NEGATIVE

## 2015-03-28 ENCOUNTER — Ambulatory Visit (INDEPENDENT_AMBULATORY_CARE_PROVIDER_SITE_OTHER): Payer: Medicaid Other | Admitting: Family

## 2015-03-28 VITALS — BP 105/62 | HR 77 | Temp 97.7°F | Wt 162.9 lb

## 2015-03-28 DIAGNOSIS — Z3493 Encounter for supervision of normal pregnancy, unspecified, third trimester: Secondary | ICD-10-CM

## 2015-03-28 NOTE — Progress Notes (Signed)
Subjective:  Carrie Bean is a 29 y.o. [id number] at 5810w2d being seen today for ongoing prenatal care.  Patient reports reports dark brown discharge.  Occasional contractions 2/hr.  No active bleeding reported.  Increased pelvic pressure.  Seen in MAU two days ago with similar symptoms.  Wet prep - negative.  .  Contractions: Irritability.  Vag. Bleeding: None. Movement: Present. Denies leaking of fluid.   The following portions of the patient's history were reviewed and updated as appropriate: allergies, current medications, past family history, past medical history, past social history, past surgical history and problem list. Problem list updated.  Objective:   Filed Vitals:   03/28/15 1058  BP: 105/62  Pulse: 77  Temp: 97.7 F (36.5 C)  Weight: 162 lb 14.4 oz (73.891 kg)    Fetal Status: Fetal Heart Rate (bpm): 126 Fundal Height: 31 cm Movement: Present     General:  Alert, oriented and cooperative. Patient is in no acute distress.  Skin: Skin is warm and dry. No rash noted.   Cardiovascular: Normal heart rate noted  Respiratory: Normal respiratory effort, no problems with respiration noted  Abdomen: Soft, gravid, appropriate for gestational age. Pain/Pressure: Present     Pelvic: Vag. Bleeding: None     Cervical exam performed      1.5/thick (same as prior check in MAU)  Extremities: Normal range of motion.  Edema: None  Mental Status: Normal mood and affect. Normal behavior. Normal judgment and thought content.   Urinalysis:     Urine results not available at discharge.     Assessment and Plan:  Pregnancy: [id number] at 6810w2d  2. Vaginal Discharge - wet prep negative  There are no diagnoses linked to this encounter. Preterm labor symptoms and general obstetric precautions including but not limited to vaginal bleeding, contractions, leaking of fluid and fetal movement were reviewed in detail with the patient. Please refer to After Visit Summary for other counseling  recommendations.  Return in about 2 weeks (around 04/11/2015).   Eino FarberWalidah Kennith GainN Karim, CNM

## 2015-03-29 ENCOUNTER — Other Ambulatory Visit (HOSPITAL_COMMUNITY): Payer: Self-pay | Admitting: Maternal and Fetal Medicine

## 2015-03-29 ENCOUNTER — Ambulatory Visit (HOSPITAL_COMMUNITY)
Admission: RE | Admit: 2015-03-29 | Discharge: 2015-03-29 | Disposition: A | Discharge status: Home / Self Care | Payer: Medicaid Other | Source: Ambulatory Visit | Attending: Family | Admitting: Family

## 2015-03-29 ENCOUNTER — Encounter (HOSPITAL_COMMUNITY): Payer: Self-pay

## 2015-03-29 DIAGNOSIS — Z3A30 30 weeks gestation of pregnancy: Secondary | ICD-10-CM | POA: Insufficient documentation

## 2015-03-29 DIAGNOSIS — O468X9 Other antepartum hemorrhage, unspecified trimester: Secondary | ICD-10-CM

## 2015-03-29 DIAGNOSIS — Z8751 Personal history of pre-term labor: Secondary | ICD-10-CM

## 2015-03-29 DIAGNOSIS — O418X90 Other specified disorders of amniotic fluid and membranes, unspecified trimester, not applicable or unspecified: Secondary | ICD-10-CM

## 2015-03-29 DIAGNOSIS — O4593 Premature separation of placenta, unspecified, third trimester: Secondary | ICD-10-CM | POA: Diagnosis not present

## 2015-04-04 ENCOUNTER — Ambulatory Visit (INDEPENDENT_AMBULATORY_CARE_PROVIDER_SITE_OTHER): Payer: Medicaid Other | Admitting: *Deleted

## 2015-04-04 ENCOUNTER — Encounter: Payer: Self-pay | Admitting: *Deleted

## 2015-04-04 VITALS — BP 109/60 | HR 93 | Temp 98.4°F | Wt 165.3 lb

## 2015-04-04 DIAGNOSIS — B373 Candidiasis of vulva and vagina: Secondary | ICD-10-CM

## 2015-04-04 DIAGNOSIS — O09213 Supervision of pregnancy with history of pre-term labor, third trimester: Secondary | ICD-10-CM

## 2015-04-04 DIAGNOSIS — B3731 Acute candidiasis of vulva and vagina: Secondary | ICD-10-CM

## 2015-04-04 MED ORDER — TERCONAZOLE 0.4 % VA CREA: 1.0000 | TOPICAL_CREAM | Freq: Every day | VAGINAL | Status: DC

## 2015-04-04 MED ORDER — TERCONAZOLE 0.4 % VA CREA
1.0000 | TOPICAL_CREAM | Freq: Every day | VAGINAL | Status: AC
Start: 2015-04-04 — End: 2015-04-07

## 2015-04-05 ENCOUNTER — Inpatient Hospital Stay (HOSPITAL_COMMUNITY)
Admission: AD | Admit: 2015-04-05 | Discharge: 2015-04-07 | DRG: 782 | Discharge status: Against Medical Advice | Payer: Medicaid Other | Source: Ambulatory Visit | Attending: Obstetrics and Gynecology | Admitting: Obstetrics and Gynecology

## 2015-04-05 ENCOUNTER — Other Ambulatory Visit (HOSPITAL_COMMUNITY): Payer: Self-pay | Admitting: Maternal and Fetal Medicine

## 2015-04-05 ENCOUNTER — Encounter (HOSPITAL_COMMUNITY): Payer: Self-pay

## 2015-04-05 ENCOUNTER — Ambulatory Visit (HOSPITAL_COMMUNITY)
Admission: RE | Admit: 2015-04-05 | Discharge: 2015-04-05 | Disposition: A | Discharge status: Home / Self Care | Payer: Medicaid Other | Source: Ambulatory Visit | Attending: Family | Admitting: Family

## 2015-04-05 VITALS — BP 108/66 | HR 92 | Wt 165.0 lb

## 2015-04-05 DIAGNOSIS — O418X90 Other specified disorders of amniotic fluid and membranes, unspecified trimester, not applicable or unspecified: Secondary | ICD-10-CM

## 2015-04-05 DIAGNOSIS — Z3A31 31 weeks gestation of pregnancy: Secondary | ICD-10-CM

## 2015-04-05 DIAGNOSIS — O4593 Premature separation of placenta, unspecified, third trimester: Principal | ICD-10-CM | POA: Diagnosis present

## 2015-04-05 DIAGNOSIS — O26893 Other specified pregnancy related conditions, third trimester: Principal | ICD-10-CM

## 2015-04-05 DIAGNOSIS — O09893 Supervision of other high risk pregnancies, third trimester: Secondary | ICD-10-CM

## 2015-04-05 DIAGNOSIS — O468X9 Other antepartum hemorrhage, unspecified trimester: Secondary | ICD-10-CM

## 2015-04-05 DIAGNOSIS — O09219 Supervision of pregnancy with history of pre-term labor, unspecified trimester: Secondary | ICD-10-CM

## 2015-04-05 DIAGNOSIS — N898 Other specified noninflammatory disorders of vagina: Secondary | ICD-10-CM

## 2015-04-05 DIAGNOSIS — O09899 Supervision of other high risk pregnancies, unspecified trimester: Secondary | ICD-10-CM

## 2015-04-05 DIAGNOSIS — O4693 Antepartum hemorrhage, unspecified, third trimester: Secondary | ICD-10-CM

## 2015-04-05 DIAGNOSIS — Z3A33 33 weeks gestation of pregnancy: Secondary | ICD-10-CM

## 2015-04-05 DIAGNOSIS — O09213 Supervision of pregnancy with history of pre-term labor, third trimester: Secondary | ICD-10-CM

## 2015-04-05 DIAGNOSIS — O4590 Premature separation of placenta, unspecified, unspecified trimester: Secondary | ICD-10-CM | POA: Diagnosis present

## 2015-04-05 NOTE — MAU Note (Signed)
Vag bleeding for 30 mins. Hx placental abruption and told had gone away. Saw MD today and told saw "alittle something" but nothing to worry about.

## 2015-04-06 ENCOUNTER — Encounter (HOSPITAL_COMMUNITY): Payer: Self-pay

## 2015-04-06 DIAGNOSIS — Z3A31 31 weeks gestation of pregnancy: Secondary | ICD-10-CM | POA: Diagnosis present

## 2015-04-06 DIAGNOSIS — O4593 Premature separation of placenta, unspecified, third trimester: Secondary | ICD-10-CM | POA: Diagnosis present

## 2015-04-06 DIAGNOSIS — O4590 Premature separation of placenta, unspecified, unspecified trimester: Secondary | ICD-10-CM | POA: Diagnosis present

## 2015-04-06 LAB — CBC WITH DIFFERENTIAL/PLATELET
Basophils Absolute: 0 10*3/uL (ref 0.0–0.1)
Basophils Relative: 0 %
EOS ABS: 0.3 10*3/uL (ref 0.0–0.7)
EOS PCT: 4 %
HCT: 31.5 % — ABNORMAL LOW (ref 36.0–46.0)
HEMOGLOBIN: 9.8 g/dL — AB (ref 12.0–15.0)
Lymphocytes Relative: 16 %
Lymphs Abs: 1.4 10*3/uL (ref 0.7–4.0)
MCH: 24.4 pg — ABNORMAL LOW (ref 26.0–34.0)
MCHC: 31.1 g/dL (ref 30.0–36.0)
MCV: 78.4 fL (ref 78.0–100.0)
MONOS PCT: 11 %
Monocytes Absolute: 1 10*3/uL (ref 0.1–1.0)
NEUTROS PCT: 69 %
Neutro Abs: 6 10*3/uL (ref 1.7–7.7)
PLATELETS: 229 10*3/uL (ref 150–400)
RBC: 4.02 MIL/uL (ref 3.87–5.11)
RDW: 16.5 % — ABNORMAL HIGH (ref 11.5–15.5)
WBC: 8.6 10*3/uL (ref 4.0–10.5)

## 2015-04-06 LAB — TYPE AND SCREEN
ABO/RH(D): B POS
ANTIBODY SCREEN: NEGATIVE

## 2015-04-06 MED ORDER — ACETAMINOPHEN 325 MG PO TABS: 650.0000 mg | ORAL_TABLET | ORAL | Status: DC | PRN

## 2015-04-06 MED ORDER — ZOLPIDEM TARTRATE 5 MG PO TABS: 5.0000 mg | ORAL_TABLET | Freq: Every evening | ORAL | Status: DC | PRN

## 2015-04-06 MED ORDER — BETAMETHASONE SOD PHOS & ACET 6 (3-3) MG/ML IJ SUSP
12.0000 mg | Freq: Once | INTRAMUSCULAR | Status: DC
Start: 2015-04-06 — End: 2015-04-06

## 2015-04-06 MED ORDER — DOCUSATE SODIUM 100 MG PO CAPS
100.0000 mg | ORAL_CAPSULE | Freq: Every day | ORAL | Status: DC
  Filled 2015-04-06: qty 1

## 2015-04-06 MED ORDER — BETAMETHASONE SOD PHOS & ACET 6 (3-3) MG/ML IJ SUSP
12.0000 mg | INTRAMUSCULAR | Status: AC
  Administered 2015-04-06 – 2015-04-07 (×2): 12 mg via INTRAMUSCULAR
  Filled 2015-04-06 (×2): qty 2

## 2015-04-06 MED ORDER — PRENATAL MULTIVITAMIN CH
1.0000 | ORAL_TABLET | Freq: Every day | ORAL | Status: DC
  Filled 2015-04-06: qty 1

## 2015-04-06 MED ORDER — CALCIUM CARBONATE ANTACID 500 MG PO CHEW
2.0000 | CHEWABLE_TABLET | ORAL | Status: DC | PRN
  Filled 2015-04-06: qty 2

## 2015-04-06 NOTE — Progress Notes (Signed)
Patient oriented to room.

## 2015-04-06 NOTE — H&P (Signed)
FACULTY PRACTICE ANTEPARTUM ADMISSION HISTORY AND PHYSICAL NOTE   History of Present Illness: Carrie Bean is a 29 y.o. [id number] at [redacted]w[redacted]d admitted for new onset active bleeding with known placental abruption for which she had a previous admission at 24w gestation. She denies having had any other bleeding since previous hospital discharge. Bleeding began around midnight 04/06/15 when she was seated on the sofa. She noticed BRB in the toilet when she got went to urinate. She denies recent sexual activity or changes in physical activity. Bleeding is associated with vaginal pressure and back pain with cramping that is at a 10 out of 10 on the pain scale, per patient, which began with onset of bleeding. Denies fevers, chills, n/v/d or any trauma.  Patient reports the fetal movement as active. Patient reports uterine contraction  activity as painful cramping. Patient reports  vaginal bleeding as flow about like a period. Patient describes fluid per vagina as None. Fetal presentation is unsure, will confirm with bedside U/S.   Patient Active Problem List   Diagnosis Date Noted  . Placental abruption 04/06/2015  . Vaginal discharge during pregnancy, antepartum 03/21/2015  . 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 Subchorionic hemorrhage, antepartum 01/15/2015  . Pregnancy with history of pre-term labor 04/21/2013  . Supervision of high-risk pregnancy 04/13/2013    Past Medical History  Diagnosis Date  . Preterm labor   . Urinary tract infection   . Gonorrhea   . Chlamydia   . Abnormal Pap smear     f/u was normal  . Anemia     Past Surgical History  Procedure Laterality Date  . No past surgeries      OB History  Gravida Para Term Preterm AB SAB TAB Ectopic Multiple Living  0 0 0 0 0 4    # Outcome Date GA Lbr Len/2nd Weight Sex Delivery Anes PTL Lv  5 Current           4 Term 07/16/13 [redacted]w[redacted]d / 00:27 7 lb 4.9 oz (3.315 kg) Genella Mech EPI  Y  3 Term 03/01/12 [redacted]w[redacted]d 02:50 / 00:11  7 lb 15.2 oz (3.606 kg) F Vag-Spont EPI  Y     Comments: WNL  2 Term 12/28/08 [redacted]w[redacted]d 06:00 8 lb (3.629 kg) M Vag-Spont  N Y  1 Preterm 02/16/06 [redacted]w[redacted]d 07:00 6 lb 7 oz (2.92 kg) M Vag-Spont EPI Y Y     Comments: 36wks 6 days      Social History   Social History  . Marital Status: Married    Spouse Name: N/A  . Number of Children: N/A  . Years of Education: N/A   Social History Main Topics  . Smoking status: Never Smoker   . Smokeless tobacco: Never Used  . Alcohol Use: No  . Drug Use: No  . Sexual Activity: Yes    Birth Control/ Protection: None   Other Topics Concern  . None   Social History Narrative    Family History  Problem Relation Age of Onset  . Other Neg Hx   . Hearing loss Neg Hx   . Hyperlipidemia Mother   . Asthma Daughter   . Asthma Son   . Hypertension Maternal Grandmother   . Stroke Maternal Grandmother     No Known Allergies  Facility-administered medications prior to admission  Medication Dose Route Frequency Provider Last Rate Last Dose  . hydroxyprogesterone caproate (DELALUTIN) 250 mg/mL injection 250 mg  250 mg Intramuscular Weekly Reva Bores,  MD   250 mg at 04/04/15 0941   Prescriptions prior to admission  Medication Sig Dispense Refill Last Dose  . Prenatal Vit-Fe Fumarate-FA (PRENATAL COMPLETE) 14-0.4 MG TABS Take 14 mg by mouth daily. 60 each 4 04/05/2015 at Unknown time  . terconazole (TERAZOL 7) 0.4 % vaginal cream Place 1 applicator vaginally at bedtime. 45 g 0 04/05/2015 at Unknown time    Review of Systems - Negative except vaginal discomfort (known yeast infection).  Vitals:  BP 113/74 mmHg  Pulse 112  Temp(Src) 97.9 F (36.6 C)  Resp 18  Ht  (1.778 m)  Wt 165 lb (74.844 kg)  BMI 23.68 kg/m2  LMP 08/28/2014 Physical Examination: CONSTITUTIONAL: Well-developed, well-nourished female in mild distress.  HENT:  Normocephalic, atraumatic SKIN: Skin is warm and dry. No rash noted. Not diaphoretic. No erythema. No  pallor. NEUROLGIC: Alert and oriented to person, place, and time. No cranial nerve deficit noted. PSYCHIATRIC: Normal mood and affect. Normal behavior. Normal judgment and thought content. CARDIOVASCULAR: Tachycardic, regular rhythm, S1, S2, no m/r/g RESPIRATORY: Effort and breath sounds normal, no problems with respiration noted ABDOMEN: Soft, nondistended, gravid with tenderness to palpation midline at lower abdomen  MUSCULOSKELETAL: Normal range of motion. No edema and no tenderness. 2+ distal pulses.  GU: Moderate amount of BRB in vaginal vault on speculum exam.  Cervix: Position: posterior, Dilation: 1.5cm and found to be thick  Membranes:intact Fetal Monitoring:Baseline: 135 bpm, Variability: Good {> 6 bpm), Accelerations: Reactive and Decelerations: Absent Tocometer: Flat with rare contractions  Labs:  Results for orders placed or performed during the hospital encounter of 04/05/15 (from the past 24 hour(s))  CBC with Differential/Platelet   Collection Time: 04/06/15  1:55 AM  Result Value Ref Range   WBC 8.6 4.0 - 10.5 K/uL   RBC 4.02 3.87 - 5.11 MIL/uL   Hemoglobin 9.8 (L) 12.0 - 15.0 g/dL   HCT 16.1 (L) 09.6 - 04.5 %   MCV 78.4 78.0 - 100.0 fL   MCH 24.4 (L) 26.0 - 34.0 pg   MCHC 31.1 30.0 - 36.0 g/dL   RDW 40.9 (H) 81.1 - 91.4 %   Platelets 229 150 - 400 K/uL   Neutrophils Relative % 69 %   Neutro Abs 6.0 1.7 - 7.7 K/uL   Lymphocytes Relative 16 %   Lymphs Abs 1.4 0.7 - 4.0 K/uL   Monocytes Relative 11 %   Monocytes Absolute 1.0 0.1 - 1.0 K/uL   Eosinophils Relative 4 %   Eosinophils Absolute 0.3 0.0 - 0.7 K/uL   Basophils Relative 0 %   Basophils Absolute 0.0 0.0 - 0.1 K/uL    Imaging Studies: US Fetal Bpp Wo Non Stress  03/14/2015  OBSTETRICAL ULTRASOUND: This exam was performed within a [place] [facility] Department. The OB US report was generated in the AS system, and faxed to the ordering physician.  This report is available in the [employer]. See the AS  Obstetric US report via the Image Link.  [place] Mfm Fetal Bpp Wo Non Stress  04/05/2015  OBSTETRICAL ULTRASOUND: This exam was performed within a [place] [facility] Department. The OB US report was generated in the AS system, and faxed to the ordering physician.  This report is available in the [employer]. See the AS Obstetric US report via the Image Link.  [place] Mfm Fetal Bpp Wo Non Stress  03/29/2015  OBSTETRICAL ULTRASOUND: This exam was performed within a [place] [facility] Department. The OB US report was generated  in the AS system, and faxed to the ordering physician.  This report is available in the [employer]. See the AS Obstetric US report via the Image Link.  [place] Mfm Ob Follow Up  03/22/2015  OBSTETRICAL ULTRASOUND: This exam was performed within a [place] Ultrasound Department. The OB US report was generated in the AS system, and faxed to the ordering physician.  This report is available in the [employer] P[employer]. See the AS Obstetric US report via the Image Link.  [place] Mfm Ob Limited  04/05/2015  OBSTETRICAL ULTRASOUND: This exam was performed within a [place] [facility]ound Department. The OB US report was generated in the AS system, and faxed to the ordering physician.  This report is available in the [employer] P[employer]. See the AS Obstetric US report via the Image Link.  [place] Mfm Ob Limited  03/14/2015  OBSTETRICAL ULTRASOUND: This exam was performed within a [place] Ultrasound Department. The OB US report was generated in the AS system, and faxed to the ordering physician.  This report is available in the [employer] P[employer]. See the AS Obstetric US report via the Image Link.   Assessment and Plan: Patient Active Problem List   Diagnosis Date Noted  . Placental abruption 04/06/2015  . Vaginal discharge during pregnancy, antepartum 03/21/2015  . 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 Subchorionic hemorrhage, antepartum 01/15/2015  . Pregnancy with history of pre-term labor 04/21/2013  .  Supervision of high-risk pregnancy 04/13/2013   Admit to Antenatal for concern of active placental abruption Obtain type and screen, CBC Monitor and keep admitted for 7 days after bleeding stops Routine antenatal care  Hillary Bethann [facility]itz[facility], PGY-1  OB fellow attestation: I have seen and examined this patient; I agree with above documentation in the resident's note.   Alvy BimlerSieddha D Boys is a 29 y.o. [id number] here for vaginal bleeding in the setting of known abruption.   PE: BP 113/74 mmHg  Pulse 112  Temp(Src) 97.9 F (36.6 C)  Resp 18  Ht 5\' 10"  (1.778 m)  Wt 165 lb (74.844 kg)  BMI 23.68 kg/m2  LMP 08/28/2014 Gen: calm comfortable, NAD Resp: normal effort, no distress Abd: gravid. TTP in lower abdomen. No guarding or rebound GU: copious bright red blood in vaginal vault. Cervix is visually 1 cm with membranes visualized at os.   ROS, labs, PMH reviewed  Plan: #Placental Abruption: - admit to antenatal - monitor bleeding, continued admission for 7 days after bleeding - CBC is relatively stable (bl 10.5-->9.8) - Type and screen  #FWB: Will given BMZ as the patient is > 2 weeks from prior dose and is < 32 weeks. No need for magnesium as delivery is not imminent.  -continuous monitoring  Federico FlakeKimberly Niles Pablo Mathurin, MD Family Medicine, OB Fellow 04/06/2015, 2:26 AM

## 2015-04-07 DIAGNOSIS — O4593 Premature separation of placenta, unspecified, third trimester: Principal | ICD-10-CM

## 2015-04-07 NOTE — Progress Notes (Signed)
Patient ID: Carrie Bean, female   DOB: 08/04/1985, 29 y.o.   MRN: [id number] FACULTY PRACTICE ANTEPARTUM(COMPREHENSIVE) NOTE  Carrie BimlerSieddha D Salsbury is a 29 y.o. [id number] at 3567w5d by best clinical estimate who is admitted for abrutpio.   Fetal presentation is cephalic. Length of Stay:  1  Days  Subjective: No bleeding since yesterday and dark red bleeding then Patient reports the fetal movement as active. Patient reports uterine contraction  activity as none. Patient reports  vaginal bleeding as none. Patient describes fluid per vagina as None.  Vitals:  Blood pressure 106/52, pulse 88, temperature 98.4 F (36.9 C), temperature source Oral, resp. rate 18, height 5\' 10"  (1.778 m), weight 165 lb (74.844 kg), last menstrual period 08/28/2014, currently breastfeeding. Physical Examination:  General appearance - alert, well appearing, and in no distress Chest - normal effort Abdomen - gravid, NT Fundal Height:  size equals dates Extremities: extremities normal, atraumatic, no cyanosis or edema  Membranes:intact  Fetal Monitoring:  Baseline: 135 bpm, Variability: Good {> 6 bpm), Accelerations: Reactive and Decelerations: Absent  Medications:  Scheduled . docusate sodium  100 mg Oral Daily  . prenatal multivitamin  1 tablet Oral Q1200   I have reviewed the patient's current medications.  ASSESSMENT: Principal Problem:   Placental abruption Active Problems:   Pregnancy with history of pre-term labor   PLAN: Stable today--for 7 days admission prior to discharge.  Pt. States this am that she will need to leave AMA as she has no child care after today.  Risks discussed at length. Patient verbalized understanding of risks of further bleeding, fetal death.  Reva BoresPRATT,Fletcher Ostermiller S, MD 04/07/2015,9:18 AM

## 2015-04-07 NOTE — Progress Notes (Signed)
Patient states she is leaving against medical advice. States she has no child care at home. Patient is aware she is the responsible party if anything happens to herself and/or unborn child. Patient is aware of s/s to return to hospital for and will follow up as needed her. Patient encouraged to follow up with her physician as soon as possible.

## 2015-04-09 ENCOUNTER — Ambulatory Visit (INDEPENDENT_AMBULATORY_CARE_PROVIDER_SITE_OTHER): Payer: Medicaid Other | Admitting: Obstetrics and Gynecology

## 2015-04-09 ENCOUNTER — Encounter: Payer: Self-pay | Admitting: *Deleted

## 2015-04-09 ENCOUNTER — Encounter (HOSPITAL_COMMUNITY): Payer: Self-pay | Admitting: *Deleted

## 2015-04-09 ENCOUNTER — Inpatient Hospital Stay (HOSPITAL_COMMUNITY)
Admission: AD | Admit: 2015-04-09 | Discharge: 2015-04-25 | DRG: 767 | Disposition: A | Discharge status: Home / Self Care | Payer: Medicaid Other | Source: Ambulatory Visit | Attending: Family Medicine | Admitting: Family Medicine

## 2015-04-09 VITALS — BP 114/64 | HR 93 | Wt 167.9 lb

## 2015-04-09 DIAGNOSIS — Z302 Encounter for sterilization: Secondary | ICD-10-CM

## 2015-04-09 DIAGNOSIS — O0990 Supervision of high risk pregnancy, unspecified, unspecified trimester: Secondary | ICD-10-CM

## 2015-04-09 DIAGNOSIS — O09219 Supervision of pregnancy with history of pre-term labor, unspecified trimester: Secondary | ICD-10-CM

## 2015-04-09 DIAGNOSIS — O42913 Preterm premature rupture of membranes, unspecified as to length of time between rupture and onset of labor, third trimester: Secondary | ICD-10-CM | POA: Diagnosis not present

## 2015-04-09 DIAGNOSIS — O4593 Premature separation of placenta, unspecified, third trimester: Principal | ICD-10-CM | POA: Diagnosis present

## 2015-04-09 DIAGNOSIS — Z3A32 32 weeks gestation of pregnancy: Secondary | ICD-10-CM | POA: Diagnosis not present

## 2015-04-09 DIAGNOSIS — O418X90 Other specified disorders of amniotic fluid and membranes, unspecified trimester, not applicable or unspecified: Secondary | ICD-10-CM

## 2015-04-09 DIAGNOSIS — N898 Other specified noninflammatory disorders of vagina: Secondary | ICD-10-CM

## 2015-04-09 DIAGNOSIS — R1013 Epigastric pain: Secondary | ICD-10-CM

## 2015-04-09 DIAGNOSIS — Z3A34 34 weeks gestation of pregnancy: Secondary | ICD-10-CM | POA: Diagnosis not present

## 2015-04-09 DIAGNOSIS — O42919 Preterm premature rupture of membranes, unspecified as to length of time between rupture and onset of labor, unspecified trimester: Secondary | ICD-10-CM | POA: Diagnosis not present

## 2015-04-09 DIAGNOSIS — O468X9 Other antepartum hemorrhage, unspecified trimester: Secondary | ICD-10-CM

## 2015-04-09 DIAGNOSIS — O09893 Supervision of other high risk pregnancies, third trimester: Secondary | ICD-10-CM

## 2015-04-09 DIAGNOSIS — O26893 Other specified pregnancy related conditions, third trimester: Secondary | ICD-10-CM

## 2015-04-09 DIAGNOSIS — O09213 Supervision of pregnancy with history of pre-term labor, third trimester: Secondary | ICD-10-CM

## 2015-04-09 DIAGNOSIS — O4590 Premature separation of placenta, unspecified, unspecified trimester: Secondary | ICD-10-CM | POA: Diagnosis not present

## 2015-04-09 DIAGNOSIS — O09212 Supervision of pregnancy with history of pre-term labor, second trimester: Secondary | ICD-10-CM

## 2015-04-09 LAB — POCT URINALYSIS DIP (DEVICE)
Bilirubin Urine: NEGATIVE
Glucose, UA: NEGATIVE mg/dL
Ketones, ur: NEGATIVE mg/dL
LEUKOCYTES UA: NEGATIVE
Nitrite: NEGATIVE
PH: 7 (ref 5.0–8.0)
Protein, ur: NEGATIVE mg/dL
SPECIFIC GRAVITY, URINE: 1.02 (ref 1.005–1.030)
UROBILINOGEN UA: 2 mg/dL — AB (ref 0.0–1.0)

## 2015-04-09 LAB — CBC
HEMATOCRIT: 30.5 % — AB (ref 36.0–46.0)
HEMOGLOBIN: 9.7 g/dL — AB (ref 12.0–15.0)
MCH: 24.6 pg — AB (ref 26.0–34.0)
MCHC: 31.8 g/dL (ref 30.0–36.0)
MCV: 77.2 fL — ABNORMAL LOW (ref 78.0–100.0)
Platelets: 205 10*3/uL (ref 150–400)
RBC: 3.95 MIL/uL (ref 3.87–5.11)
RDW: 16.7 % — AB (ref 11.5–15.5)
WBC: 8.2 10*3/uL (ref 4.0–10.5)

## 2015-04-09 MED ORDER — ZOLPIDEM TARTRATE 5 MG PO TABS
5.0000 mg | ORAL_TABLET | Freq: Every evening | ORAL | Status: DC | PRN
  Administered 2015-04-10 – 2015-04-13 (×3): 5 mg via ORAL
  Filled 2015-04-09 (×3): qty 1

## 2015-04-09 MED ORDER — CALCIUM CARBONATE ANTACID 500 MG PO CHEW: 2.0000 | CHEWABLE_TABLET | ORAL | Status: DC | PRN

## 2015-04-09 MED ORDER — PRENATAL MULTIVITAMIN CH
1.0000 | ORAL_TABLET | Freq: Every day | ORAL | Status: DC
  Administered 2015-04-10 – 2015-04-13 (×4): 1 via ORAL
  Filled 2015-04-09 (×4): qty 1

## 2015-04-09 MED ORDER — ACETAMINOPHEN 325 MG PO TABS
650.0000 mg | ORAL_TABLET | ORAL | Status: DC | PRN
  Administered 2015-04-12 – 2015-04-13 (×2): 650 mg via ORAL
  Filled 2015-04-09 (×2): qty 2

## 2015-04-09 MED ORDER — HYDROXYPROGESTERONE CAPROATE 250 MG/ML IM OIL
250.0000 mg | TOPICAL_OIL | INTRAMUSCULAR | Status: DC
  Administered 2015-04-11: 250 mg via INTRAMUSCULAR
  Filled 2015-04-09: qty 1

## 2015-04-09 MED ORDER — DOCUSATE SODIUM 100 MG PO CAPS
100.0000 mg | ORAL_CAPSULE | Freq: Every day | ORAL | Status: DC
  Administered 2015-04-10 – 2015-04-13 (×2): 100 mg via ORAL
  Filled 2015-04-09 (×3): qty 1

## 2015-04-09 NOTE — H&P (Signed)
Carrie Bean is a 29 y.o. female presenting for known abruption with bleeding (recurrent)  She is now 6733w0d   She was hospitalized 10/29-30 for repeat abruption, left AMA due to child care issues. Bleeding had stopped while in hospital. This morning new bleeding, like a heavy period. Also with some abdominal pain. Contractions: Irritability. Vag. Bleeding: Small. Movement: Present. Denies leaking of fluid.  So she returns tonight for readmission and monitoring/observation.   Copied from recent H&P: Carrie Bean is a 29 y.o. [id number] at 2028w4d admitted for new onset active bleeding with known placental abruption for which she had a previous admission at 24w gestation. She denies having had any other bleeding since previous hospital discharge. Bleeding began around midnight 04/06/15 when she was seated on the sofa. She noticed BRB in the toilet when she got went to urinate. She denies recent sexual activity or changes in physical activity. Bleeding is associated with vaginal pressure and back pain with cramping that is at a 10 out of 10 on the pain scale, per patient, which began with onset of bleeding. Denies fevers, chills, n/v/d or any trauma.  Patient reports the fetal movement as active. Patient reports uterine contraction activity as painful cramping. Patient reports vaginal bleeding as flow about like a period. Patient describes fluid per vagina as None. Fetal presentation is unsure, will confirm with bedside U/S.   Maternal Medical History:  Reason for admission: Vaginal bleeding.  Nausea.  Contractions: Onset was 1 week ago.   Frequency: irregular.   Perceived severity is mild.    Fetal activity: Perceived fetal activity is normal.   Last perceived fetal movement was within the past hour.    Prenatal complications: Bleeding, placental abnormality and preterm labor.   No HIV, PIH, infection or pre-eclampsia.   Prenatal Complications - Diabetes: none.    OB History    Gravida Para Term Preterm AB TAB SAB Ectopic Multiple Living   5 4 3 1  0 0 0 0 0 4     Past Medical History  Diagnosis Date  . Preterm labor   . Urinary tract infection   . Gonorrhea   . Chlamydia   . Abnormal Pap smear     f/u was normal  . Anemia    Past Surgical History  Procedure Laterality Date  . No past surgeries     Family History: family history includes Asthma in her daughter and son; Hyperlipidemia in her mother; Hypertension in her maternal grandmother; Stroke in her maternal grandmother. There is no history of Other or Hearing loss. Social History:  reports that she has never smoked. She has never used smokeless tobacco. She reports that she does not drink alcohol or use illicit drugs.  Medical, Surgical, Family and Social histories reviewed and are listed above.  Medications and allergies reviewed.   Prenatal Transfer Tool  Maternal Diabetes: No Genetic Screening: Normal Maternal Ultrasounds/Referrals: Abnormal:  Findings:   Other: Placental abruption, fundal Fetal Ultrasounds or other Referrals:  Referred to Materal Fetal Medicine  Maternal Substance Abuse:  No Significant Maternal Medications:  None Significant Maternal Lab Results:  None Other Comments:  See previous H&P  Review of Systems  Constitutional: Negative for fever, chills and malaise/fatigue.  Respiratory: Negative for shortness of breath.   Gastrointestinal: Negative for nausea, vomiting, abdominal pain, diarrhea and constipation.  Genitourinary: Negative for dysuria.       Vaginal bleeding, now brown in color   Musculoskeletal: Negative for back pain.  Neurological: Negative for  dizziness, weakness and headaches.      Blood pressure 118/64, pulse 99, temperature 98.7 F (37.1 C), temperature source Oral, resp. rate 18, height  (1.778 m), weight 75.297 kg (166 lb), last menstrual period 08/28/2014, currently breastfeeding. Maternal Exam:  Uterine Assessment: Contraction strength is  mild.  Contraction frequency is irregular.  Small irritability cramps, 10-20 seconds   Abdomen: Patient reports no abdominal tenderness. Fundal height is 32.    Introitus: Vagina is positive for vaginal discharge (brown discharge).  Ferning test: not done.  Nitrazine test: not done.     Fetal Exam Fetal Monitor Review: Mode: ultrasound.   Baseline rate: 140.  Variability: moderate (6-25 bpm).   Pattern: accelerations present and no decelerations.    Fetal State Assessment: Category I - tracings are normal.     Physical Exam  Constitutional: She is oriented to person, place, and time. She appears well-developed and well-nourished. No distress.  HENT:  Head: Normocephalic.  Cardiovascular: Normal rate, regular rhythm and normal heart sounds.   Respiratory: Effort normal and breath sounds normal. No respiratory distress. She has no wheezes. She has no rales.  GI: Soft. She exhibits no distension. There is no tenderness. There is no rebound and no guarding.  Genitourinary: Vaginal discharge (brown discharge) found.  Musculoskeletal: Normal range of motion. She exhibits no edema.  Neurological: She is alert and oriented to person, place, and time.  Skin: Skin is warm and dry.  Psychiatric: She has a normal mood and affect.    Results for orders placed or performed during the hospital encounter of 04/09/15 (from the past 24 hour(s))  CBC     Status: Abnormal   Collection Time: 04/09/15  9:40 PM  Result Value Ref Range   WBC 8.2 4.0 - 10.5 K/uL   RBC 3.95 3.87 - 5.11 MIL/uL   Hemoglobin 9.7 (L) 12.0 - 15.0 g/dL   HCT 45.4 (L) 09.8 - 11.9 %   MCV 77.2 (L) 78.0 - 100.0 fL   MCH 24.6 (L) 26.0 - 34.0 pg   MCHC 31.8 30.0 - 36.0 g/dL   RDW 14.7 (H) 82.9 - 56.2 %   Platelets 205 150 - 400 K/uL   9.8 (L) 10.5 (L) 9.6 (L)      04/06/15        3 weeks ago            4 weeks ago   Prenatal labs: ABO, Rh: --/--/B POS (10/29 0155) Antibody: NEG (10/29 0155) Rubella: 2.42  (08/08 1053) RPR: NON REAC (10/06 1127)  HBsAg: NEGATIVE (08/08 1053)  HIV: NONREACTIVE (10/06 1127)  GBS:     Assessment/Plan: A:  SIUP at [redacted]w[redacted]d      Placental Abruption      Bleeding in the 3rd trimester  P;  Admit to Antenatal unit      Continuous EFM      Bleeding precautions/pad counts      Repeat CBC      Maintain T&S      MD to follow in AM  [facility] 04/09/2015, 9:54 PM

## 2015-04-09 NOTE — Discharge Summary (Signed)
Antenatal Physician Discharge Summary  Patient ID: Carrie Bean MRN: [id number] DOB/AGE: 27/08/1985 27 y.o.  Admit date: 04/05/2015 Discharge date: 04/09/2015  Admission Diagnoses: Principal Problem:   Placental abruption Active Problems:   Pregnancy with history of pre-term labor    Discharge Diagnoses: Same  Prenatal Procedures: NST  Consults: None  Hospital Course:  This is a 29 y.o. [id number] with IUP at 3271w0d admitted for vaginal bleeding.  [facility] and placental abruption.  [name] She stopped bleeding after 2 days and could not stay any longer due to child care issues. She received betamethasone x 2 doses.  It was recommended that she stay for 7 days since last bleed but she left AMA.  Discharge Exam: Pulse Rate:  [93] 93 (11/01 1042) BP: (114)/(64) 114/64 mmHg (11/01 1042) Weight:  [167 lb 14.4 oz (76.159 kg)] 167 lb 14.4 oz (76.159 kg) (11/01 1042) Physical Examination: CONSTITUTIONAL: Well-developed, well-nourished female in no acute distress.  HENT:  Normocephalic, atraumatic, External right and left ear normal. Oropharynx is clear and moist EYES: Conjunctivae and EOM are normal. Pupils are equal, round, and reactive to light. No scleral icterus.  NECK: Normal range of motion, supple, no masses SKIN: Skin is warm and dry. No rash noted. Not diaphoretic. No erythema. No pallor. NEUROLGIC: Alert and oriented to person, place, and time. Normal reflexes, muscle tone coordination. No cranial nerve deficit noted. PSYCHIATRIC: Normal mood and affect. Normal behavior. Normal judgment and thought content. CARDIOVASCULAR: Normal heart rate noted, regular rhythm RESPIRATORY: Effort and breath sounds normal, no problems with respiration noted MUSCULOSKELETAL: Normal range of motion. No edema and no tenderness. 2+ distal pulses. ABDOMEN: Soft, nontender, nondistended, gravid. CERVIX: Dilation: 1.5 Exam by:: Dr. Sampson GoonFitzgerald and Dr Alvester MorinNewton  Fetal  monitoring: FHR: 135 bpm, Variability: moderate, Accelerations: Present, Decelerations: Absent  Uterine activity: Occasional contractions per hour  Significant Diagnostic Studies:  Results for orders placed or performed in visit on 04/09/15 (from the past 168 hour(s))  POCT urinalysis dip (device)   Collection Time: 04/09/15 10:49 AM  Result Value Ref Range   Glucose, UA NEGATIVE NEGATIVE mg/dL   Bilirubin Urine NEGATIVE NEGATIVE   Ketones, ur NEGATIVE NEGATIVE mg/dL   Specific Gravity, Urine 1.020 1.005 - 1.030   Hgb urine dipstick SMALL (A) NEGATIVE   pH 7.0 5.0 - 8.0   Protein, ur NEGATIVE NEGATIVE mg/dL   Urobilinogen, UA 2.0 (H) 0.0 - 1.0 mg/dL   Nitrite NEGATIVE NEGATIVE   Leukocytes, UA NEGATIVE NEGATIVE  Results for orders placed or performed during the hospital encounter of 04/05/15 (from the past 168 hour(s))  CBC with Differential/Platelet   Collection Time: 04/06/15  1:55 AM  Result Value Ref Range   WBC 8.6 4.0 - 10.5 K/uL   RBC 4.02 3.87 - 5.11 MIL/uL   Hemoglobin 9.8 (L) 12.0 - 15.0 g/dL   HCT 62.131.5 (L) 30.836.0 - 65.746.0 %   MCV 78.4 78.0 - 100.0 fL   MCH 24.4 (L) 26.0 - 34.0 pg   MCHC 31.1 30.0 - 36.0 g/dL   RDW 84.616.5 (H) 96.211.5 - 95.215.5 %   Platelets 229 150 - 400 K/uL   Neutrophils Relative % 69 %   Neutro Abs 6.0 1.7 - 7.7 K/uL   Lymphocytes Relative 16 %   Lymphs Abs 1.4 0.7 - 4.0 K/uL   Monocytes Relative 11 %   Monocytes Absolute 1.0 0.1 - 1.0 K/uL   Eosinophils Relative 4 %   Eosinophils Absolute 0.3 0.0 -  0.7 K/uL   Basophils Relative 0 %   Basophils Absolute 0.0 0.0 - 0.1 K/uL  Type and screen [facility]   Collection Time: 04/06/15  1:55 AM  Result Value Ref Range   ABO/RH(D) B POS    Antibody Screen NEG    Sample Expiration 04/09/2015     Discharge Condition: Stable  Disposition: 07-Left Against Medical Advice      Medication List    ASK your doctor about these medications        PRENATAL COMPLETE 14-0.4 MG Tabs  Take 14  mg by mouth daily.     terconazole 0.4 % vaginal cream  Commonly known as:  TERAZOL 7  Place 1 applicator vaginally at bedtime.  Ask about: Should I take this medication?      continue 17 P     Follow-up Information    Follow up with [facility] In 1 week.   Specialty:  Obstetrics and Gynecology   Why:  OB check up   Contact information:   [address] [place] [address] [phone]      Signed: Reva Bores M.D. 04/09/2015, 2:45 PM

## 2015-04-09 NOTE — Progress Notes (Signed)
Subjective:  Carrie Bean is a 29 y.o. [id number] at 652w0d being seen today for ongoing prenatal care. Hospitalized 10/29-30 for repeat abruption, left AMA due to child care issues. Bleeding had stopped while in hospital. This morning new bleeding, like a heavy period. Also with some abdominal pain.  Contractions: Irritability.  Vag. Bleeding: Small. Movement: Present. Denies leaking of fluid.   The following portions of the patient's history were reviewed and updated as appropriate: allergies, current medications, past family history, past medical history, past social history, past surgical history and problem list. Problem list updated.  Objective:   Filed Vitals:   04/09/15 1042  BP: 114/64  Pulse: 93  Weight: 167 lb 14.4 oz (76.159 kg)    Fetal Status: Fetal Heart Rate (bpm): 129 Fundal Height: 32 cm Movement: Present     General:  Alert, oriented and cooperative. Patient is in no acute distress.  Skin: Skin is warm and dry. No rash noted.   Cardiovascular: Normal heart rate noted  Respiratory: Normal respiratory effort, no problems with respiration noted  Abdomen: Soft, gravid, appropriate for gestational age. Pain/Pressure: Present     Pelvic: Vag. Bleeding: Small     Moderate amount bright red blood  Extremities: Normal range of motion.  Edema: None  Mental Status: Normal mood and affect. Normal behavior. Normal judgment and thought content.   Urinalysis: Urine Protein: Negative Urine Glucose: Negative  Assessment and Plan:  Pregnancy: [id number] at [redacted]w[redacted]d  # Abruption - new bleed today - reactive NST - STRONGLY recommended immediate admission - patient declined and will sign-out AMA but with plan to return this PM for admission as she has child care to arrange  There are no diagnoses linked to this encounter. Preterm labor symptoms and general obstetric precautions including but not limited to vaginal bleeding, contractions, leaking of fluid and fetal movement were  reviewed in detail with the patient. Please refer to After Visit Summary for other counseling recommendations.  Return in about 2 weeks (around 04/23/2015).   Kathrynn RunningNoah Bedford Donshay Lupinski, MD

## 2015-04-10 DIAGNOSIS — O4593 Premature separation of placenta, unspecified, third trimester: Principal | ICD-10-CM

## 2015-04-10 LAB — TYPE AND SCREEN
ABO/RH(D): B POS
ANTIBODY SCREEN: NEGATIVE

## 2015-04-10 MED ORDER — FLUCONAZOLE 150 MG PO TABS
150.0000 mg | ORAL_TABLET | Freq: Once | ORAL | Status: AC
  Administered 2015-04-10: 150 mg via ORAL
  Filled 2015-04-10: qty 1

## 2015-04-10 NOTE — Progress Notes (Signed)
Patient ID: Carrie Bean, female   DOB: 12/26/1985, 29 y.o.   MRN: [id number] FACULTY PRACTICE ANTEPARTUM(COMPREHENSIVE) NOTE  Carrie Bean is a 29 y.o. [id number] at 7058w1d  who is admitted for placental abruption.   Length of Stay:  1  Days  Subjective: Patient reports the fetal movement as active. Patient reports uterine contraction  activity as none. Patient reports  vaginal bleeding as dark brown staining. Patient describes fluid per vagina as None.  Vitals:  Blood pressure 118/64, pulse 99, temperature 98.7 F (37.1 C), temperature source Oral, resp. rate 18, height 5\' 10"  (1.778 m), weight 166 lb (75.297 kg), last menstrual period 08/28/2014, currently breastfeeding. Physical Examination:  General appearance - alert, well appearing, and in no distress Abdomen - soft, nontender, nondistended, no masses or organomegaly Extremities - Homan's sign negative bilaterally  Fetal Monitoring:  Baseline: 130 bpm, Variability: Good {> 6 bpm), Accelerations: Reactive and Decelerations: Absent  Labs:  Results for orders placed or performed during the hospital encounter of 04/09/15 (from the past 24 hour(s))  CBC   Collection Time: 04/09/15  9:40 PM  Result Value Ref Range   WBC 8.2 4.0 - 10.5 K/uL   RBC 3.95 3.87 - 5.11 MIL/uL   Hemoglobin 9.7 (L) 12.0 - 15.0 g/dL   HCT 13.030.5 (L) 86.536.0 - 78.446.0 %   MCV 77.2 (L) 78.0 - 100.0 fL   MCH 24.6 (L) 26.0 - 34.0 pg   MCHC 31.8 30.0 - 36.0 g/dL   RDW 69.616.7 (H) 29.511.5 - 28.415.5 %   Platelets 205 150 - 400 K/uL  Results for orders placed or performed in visit on 04/09/15 (from the past 24 hour(s))  POCT urinalysis dip (device)   Collection Time: 04/09/15 10:49 AM  Result Value Ref Range   Glucose, UA NEGATIVE NEGATIVE mg/dL   Bilirubin Urine NEGATIVE NEGATIVE   Ketones, ur NEGATIVE NEGATIVE mg/dL   Specific Gravity, Urine 1.020 1.005 - 1.030   Hgb urine dipstick SMALL (A) NEGATIVE   pH 7.0 5.0 - 8.0   Protein, ur NEGATIVE NEGATIVE mg/dL   Urobilinogen, UA 2.0 (H) 0.0 - 1.0 mg/dL   Nitrite NEGATIVE NEGATIVE   Leukocytes, UA NEGATIVE NEGATIVE    Medications:  Scheduled . docusate sodium  100 mg Oral Daily  . [START ON 04/11/2015] hydroxyprogesterone caproate  250 mg Intramuscular Weekly  . prenatal multivitamin  1 tablet Oral Q1200   I have reviewed the patient's current medications.  ASSESSMENT: Patient Active Problem List   Diagnosis Date Noted  . Placental abruption in third trimester 04/09/2015  . Placental abruption 04/06/2015  . Vaginal discharge during pregnancy, antepartum 03/21/2015  . Subchorionic hemorrhage, antepartum 01/15/2015  . Pregnancy with history of pre-term labor 04/21/2013  . Supervision of high-risk pregnancy 04/13/2013    PLAN: NST one hour tid Admission for 7 days post BRB MFM scans as recommended (growth due November 11th, AFI once a week on Fridays)  Carrie Bean H. 04/10/2015,6:35 AM

## 2015-04-11 ENCOUNTER — Inpatient Hospital Stay (HOSPITAL_COMMUNITY): Payer: Medicaid Other

## 2015-04-11 ENCOUNTER — Encounter: Payer: Medicaid Other | Admitting: Family

## 2015-04-11 NOTE — Progress Notes (Signed)
Patient ID: Carrie Bean, female   DOB: 05/09/1986, 29 y.o.   MRN: [id number] FACULTY PRACTICE ANTEPARTUM(COMPREHENSIVE) NOTE  Carrie BimlerSieddha D Bean is a 29 y.o. [id number] at 8144w2d  who is admitted for placental abruption.   Length of Stay:  2  Days  Subjective: Patient reports the fetal movement as active. Patient reports uterine contraction  activity as none. Patient reports  vaginal bleeding as dark brown staining. Patient describes fluid per vagina as None.  Vitals:  Blood pressure 98/53, pulse 90, temperature 97.7 F (36.5 C), temperature source Oral, resp. rate 18, height 5\' 10"  (1.778 m), weight 166 lb (75.297 kg), last menstrual period 08/28/2014, currently breastfeeding. Physical Examination:  General appearance - alert, well appearing, and in no distress Abdomen - soft, nontender, gravid Extremities - Homan's sign negative bilaterally  Fetal Monitoring:  Baseline: 150 bpm, Variability: Good {> 6 bpm), Accelerations: Reactive, Decelerations: Absent and Toco: occasional contractions  Labs:  Results for orders placed or performed during the hospital encounter of 04/09/15 (from the past 24 hour(s))  [facility] screen [facility]   Collection Time: 04/10/15  8:30 PM  Result Value Ref Range   ABO/RH(D) B POS    Antibody Screen NEG    Sample Expiration 04/13/2015     Medications:  Scheduled . docusate sodium  100 mg Oral Daily  . hydroxyprogesterone caproate  250 mg Intramuscular Weekly  . prenatal multivitamin  1 tablet Oral Q1200   I have reviewed the patient's current medications.  ASSESSMENT: Patient Active Problem List   Diagnosis Date Noted  . Placental abruption in third trimester 04/09/2015  . Placental abruption 04/06/2015  . Vaginal discharge during pregnancy, antepartum 03/21/2015  . Subchorionic hemorrhage, antepartum 01/15/2015  . Pregnancy with history of pre-term labor 04/21/2013  . Supervision of high-risk pregnancy 04/13/2013     PLAN: Continue inpatient monitoring for a total of 7 days since last episode of BRB  Discharge planning on 11/5 which would be 7 days post BRB episode Follow up ultrasound with MFM on 11/4 Continue current care   [name] 04/11/2015,9:06 AM

## 2015-04-12 ENCOUNTER — Ambulatory Visit (HOSPITAL_COMMUNITY): Admission: RE | Admit: 2015-04-12 | Payer: Medicaid Other | Source: Ambulatory Visit

## 2015-04-12 NOTE — Progress Notes (Signed)
MD notified of pt c/o sharp pains in her vaginal area and a small dark red blood clot note in the toilet when pt up to void. Pt also stating that she noted a lot of blood tinged mucous when she was in the bathroom as well. Md made aware that pt was placed back on the monitor and FHR was WNL and no contractions noted at this time.

## 2015-04-12 NOTE — Progress Notes (Signed)
Pt refused IV start/fluids.

## 2015-04-12 NOTE — Progress Notes (Signed)
RN called to room. Per patient, small amount of bleeding and feeling irregular contractions. Patient instructed to save it for nurse to observe. RN offered to place on monitor. Patient refused at time.

## 2015-04-12 NOTE — Progress Notes (Addendum)
Patient ID: Carrie Bean, female   DOB: 11/24/1985, 29 y.o.   MRN: [id number] FACULTY PRACTICE ANTEPARTUM(COMPREHENSIVE) NOTE  Carrie BimlerSieddha D Collister is a 29 y.o. [id number] at 6319w3d  who is admitted for placental abruption.   Length of Stay:  3  Days  Subjective: Patient reports feeling cramping pains this morning and noticing bright red blood on toilet paper when she wiped Patient reports the fetal movement as active. Patient reports uterine contraction  activity as none. Patient reports  vaginal bleeding as scant staining. Patient describes fluid per vagina as None.  Vitals:  Blood pressure 107/65, pulse 102, temperature 97.3 F (36.3 C), temperature source Axillary, resp. rate 18, height 5\' 10"  (1.778 m), weight 166 lb (75.297 kg), last menstrual period 08/28/2014, currently breastfeeding. Physical Examination:  General appearance - alert, well appearing, and in no distress Abdomen - soft, nontender, gravid Extremities - Homan's sign negative bilaterally  Fetal Monitoring:  Baseline: 135 bpm, Variability: Good {> 6 bpm), Accelerations: Reactive, Decelerations: Absent and Toco: no contractions  Labs:  No results found for this or any previous visit (from the past 24 hour(s)).  Medications:  Scheduled . docusate sodium  100 mg Oral Daily  . hydroxyprogesterone caproate  250 mg Intramuscular Weekly  . prenatal multivitamin  1 tablet Oral Q1200   I have reviewed the patient's current medications.  ASSESSMENT: Patient Active Problem List   Diagnosis Date Noted  . Placental abruption in third trimester 04/09/2015  . Placental abruption 04/06/2015  . Vaginal discharge during pregnancy, antepartum 03/21/2015  . Subchorionic hemorrhage, antepartum 01/15/2015  . Pregnancy with history of pre-term labor 04/21/2013  . Supervision of high-risk pregnancy 04/13/2013    PLAN: 1) New onset bright red vaginal bleeding Reviewed ultrasound results with the patient - stable abruption of 6.5  cm. All questions were answered Discussed with the patient the need to remain in hospital for further monitoring due to new onset of bright red vaginal bleeding. Patient was upset about this news as she has other children to care for Patient declined IV placement. Encouraged oral hydration Continue close monitoring Continue current care   Veronia Laprise 04/12/2015,6:40 AM

## 2015-04-13 LAB — PREPARE RBC (CROSSMATCH)

## 2015-04-13 LAB — CBC
HCT: 35 % — ABNORMAL LOW (ref 36.0–46.0)
HEMOGLOBIN: 11.1 g/dL — AB (ref 12.0–15.0)
MCH: 24.7 pg — AB (ref 26.0–34.0)
MCHC: 31.7 g/dL (ref 30.0–36.0)
MCV: 77.8 fL — AB (ref 78.0–100.0)
Platelets: 222 10*3/uL (ref 150–400)
RBC: 4.5 MIL/uL (ref 3.87–5.11)
RDW: 16.7 % — ABNORMAL HIGH (ref 11.5–15.5)
WBC: 8.8 10*3/uL (ref 4.0–10.5)

## 2015-04-13 MED ORDER — LACTATED RINGERS IV SOLN: 500.0000 mL | INTRAVENOUS | Status: DC | PRN

## 2015-04-13 MED ORDER — FLEET ENEMA 7-19 GM/118ML RE ENEM: 1.0000 | ENEMA | RECTAL | Status: DC | PRN

## 2015-04-13 MED ORDER — LACTATED RINGERS IV SOLN
INTRAVENOUS | Status: DC
Start: 2015-04-13 — End: 2015-04-15
  Administered 2015-04-13 – 2015-04-15 (×3): via INTRAVENOUS

## 2015-04-13 MED ORDER — OXYTOCIN BOLUS FROM INFUSION: 500.0000 mL | INTRAVENOUS | Status: DC

## 2015-04-13 MED ORDER — ACETAMINOPHEN 325 MG PO TABS
650.0000 mg | ORAL_TABLET | ORAL | Status: DC | PRN
  Administered 2015-04-13 – 2015-04-14 (×3): 650 mg via ORAL
  Filled 2015-04-13 (×3): qty 2

## 2015-04-13 MED ORDER — ONDANSETRON HCL 4 MG/2ML IJ SOLN: 4.0000 mg | Freq: Four times a day (QID) | INTRAMUSCULAR | Status: DC | PRN

## 2015-04-13 MED ORDER — FENTANYL 2.5 MCG/ML BUPIVACAINE 1/10 % EPIDURAL INFUSION (WH - ANES): 14.0000 mL/h | INTRAMUSCULAR | Status: DC | PRN

## 2015-04-13 MED ORDER — AZITHROMYCIN 250 MG PO TABS: 1000.0000 mg | ORAL_TABLET | Freq: Once | ORAL | Status: DC

## 2015-04-13 MED ORDER — CITRIC ACID-SODIUM CITRATE 334-500 MG/5ML PO SOLN: 30.0000 mL | ORAL | Status: DC | PRN

## 2015-04-13 MED ORDER — DIPHENHYDRAMINE HCL 50 MG/ML IJ SOLN: 12.5000 mg | INTRAMUSCULAR | Status: DC | PRN

## 2015-04-13 MED ORDER — EPHEDRINE 5 MG/ML INJ
10.0000 mg | INTRAVENOUS | Status: DC | PRN
  Filled 2015-04-13: qty 2

## 2015-04-13 MED ORDER — PHENYLEPHRINE 40 MCG/ML (10ML) SYRINGE FOR IV PUSH (FOR BLOOD PRESSURE SUPPORT)
80.0000 ug | PREFILLED_SYRINGE | INTRAVENOUS | Status: DC | PRN
  Filled 2015-04-13: qty 2

## 2015-04-13 MED ORDER — SODIUM CHLORIDE 0.9 % IV SOLN: Freq: Once | INTRAVENOUS | Status: DC

## 2015-04-13 MED ORDER — OXYTOCIN 40 UNITS IN LACTATED RINGERS INFUSION - SIMPLE MED: 62.5000 mL/h | INTRAVENOUS | Status: DC

## 2015-04-13 MED ORDER — SODIUM CHLORIDE 0.9 % IV SOLN
2.0000 g | Freq: Four times a day (QID) | INTRAVENOUS | Status: AC
  Administered 2015-04-13 – 2015-04-15 (×8): 2 g via INTRAVENOUS
  Filled 2015-04-13 (×8): qty 2000

## 2015-04-13 MED ORDER — OXYCODONE-ACETAMINOPHEN 5-325 MG PO TABS: 1.0000 | ORAL_TABLET | ORAL | Status: DC | PRN

## 2015-04-13 MED ORDER — OXYCODONE-ACETAMINOPHEN 5-325 MG PO TABS: 2.0000 | ORAL_TABLET | ORAL | Status: DC | PRN

## 2015-04-13 MED ORDER — LIDOCAINE HCL (PF) 1 % IJ SOLN: 30.0000 mL | INTRAMUSCULAR | Status: DC | PRN

## 2015-04-13 NOTE — Plan of Care (Signed)
Problem: Education: Goal: Ability to make informed decisions regarding treatment and plan of care will improve Outcome: Progressing Discussed current plan of care to continue to monitor  Problem: Safety: Goal: Chance of risk for complications during labor will decrease Outcome: Progressing Safety measures implemented

## 2015-04-13 NOTE — Progress Notes (Signed)
Dr Emelda Fearferguson here to perform bedside ultrasound. Vertex position confirmed.

## 2015-04-13 NOTE — Progress Notes (Addendum)
Progress Note Alvy BimlerSieddha D Whisnant is a 29 y.o. [id number] at 8182w4d admitted for chronic abruption with SROM  S: RN called for large gush of fluid. Additionally fetal heart tracing showed variable with newly decreased variability  O:  BP 123/71 mmHg  Pulse 97  Temp(Src) 98.8 F (37.1 C) (Oral)  Resp 18  Ht 5\' 10"  (1.778 m)  Wt 166 lb (75.297 kg)  BMI 23.82 kg/m2  LMP 08/28/2014 EFM: 140/minimal/absent, 1 variable.  Speculm exam Copious blood in vaginal vault, needed 6 fox swabs to see cervix. Confirmed bleeding from cervical os Dilation: 3.5 Exam by:: Saphira Lahmann   A&P: 29 y.o. [id number] 3482w4d admitted to antenatal for chronic abruption with bleeding with new onset sudden loss of fluid and new bleeding with non-reassuring fetal monitoring.   #Labor: Transfer to L&D for monitoring. Currently vertex, if labor progresses will not stop. Plan for latency antibiotics.  #FWB: Cat II #GBS unknown, preterm. Ampicillin per above. Received BMZ 9/12&9/13 and then rescue on 10/29 and 10/30 #Method of Delivery: Plan for vaginal, IOL at 34 weeks.  If not reassuring fetal monitoring, possible C-section with desired BTL.   Federico FlakeKimberly Niles Vielka Klinedinst, MD 6:12 PM

## 2015-04-13 NOTE — Progress Notes (Signed)
FACULTY PRACTICE ANTEPARTUM(COMPREHENSIVE) NOTE  Carrie Bean is a 29 y.o. [id number] at 7738w4d by LMP who is admitted for vaginal bleeding.   Fetal presentation is cephalic. Length of Stay:  4  Days  Subjective: Less bleeding this morning Patient reports the fetal movement as active. Patient reports uterine contraction  activity as irregular. Patient reports  vaginal bleeding as spotting. Patient describes fluid per vagina as None.  Vitals:  Blood pressure 96/49, pulse 92, temperature 97 F (36.1 C), temperature source Oral, resp. rate 18, height 5\' 10"  (1.778 m), weight 75.297 kg (166 lb), last menstrual period 08/28/2014, currently breastfeeding. Physical Examination:  General appearance - alert, well appearing, and in no distress Heart - normal rate and regular rhythm Abdomen - soft, nontender, nondistended Fundal Height:  size equals dates Cervical Exam: Not evaluated. . Extremities: extremities normal, atraumatic, no cyanosis or edema and Homans sign is negative, no sign of DVT Membranes:intact  Fetal Monitoring:    Fetal Heart Rate A     Mode  External filed at 04/12/2015 2340    Baseline Rate (A)  140 bpm filed at 04/13/2015 0052    Variability  6-25 BPM filed at 04/13/2015 0052    Accelerations  15 x 15 filed at 04/13/2015 0052    Decelerations  None filed at 04/13/2015 0052        Labs:  No results found for this or any previous visit (from the past 24 hour(s)).  Imaging Studies:     Currently EPIC will not allow sonographic studies to automatically populate into notes.  In the meantime, copy and paste results into note or free text.  Medications:  Scheduled . docusate sodium  100 mg Oral Daily  . hydroxyprogesterone caproate  250 mg Intramuscular Weekly  . prenatal multivitamin  1 tablet Oral Q1200   I have reviewed the patient's current medications.  ASSESSMENT: Patient Active Problem List   Diagnosis Date Noted  . Placental abruption in third  trimester 04/09/2015  . Placental abruption 04/06/2015  . Vaginal discharge during pregnancy, antepartum 03/21/2015  . Subchorionic hemorrhage, antepartum 01/15/2015  . Pregnancy with history of pre-term labor 04/21/2013  . Supervision of high-risk pregnancy 04/13/2013    PLAN: Observe for vaginal bleeding and consider length of hospitalization based on clinical situation  ARNOLD,JAMES 04/13/2015,7:28 AM

## 2015-04-14 DIAGNOSIS — O4593 Premature separation of placenta, unspecified, third trimester: Secondary | ICD-10-CM | POA: Diagnosis not present

## 2015-04-14 DIAGNOSIS — O42919 Preterm premature rupture of membranes, unspecified as to length of time between rupture and onset of labor, unspecified trimester: Secondary | ICD-10-CM | POA: Diagnosis not present

## 2015-04-14 MED ORDER — DOCUSATE SODIUM 100 MG PO CAPS
100.0000 mg | ORAL_CAPSULE | Freq: Every day | ORAL | Status: DC
  Administered 2015-04-14 – 2015-04-22 (×3): 100 mg via ORAL
  Filled 2015-04-14 (×7): qty 1

## 2015-04-14 MED ORDER — CALCIUM CARBONATE ANTACID 500 MG PO CHEW
2.0000 | CHEWABLE_TABLET | ORAL | Status: DC | PRN
Start: 2015-04-14 — End: 2015-04-23

## 2015-04-14 MED ORDER — ACETAMINOPHEN 325 MG PO TABS: 650.0000 mg | ORAL_TABLET | ORAL | Status: DC | PRN

## 2015-04-14 MED ORDER — PRENATAL MULTIVITAMIN CH
1.0000 | ORAL_TABLET | Freq: Every day | ORAL | Status: DC
  Administered 2015-04-14 – 2015-04-22 (×9): 1 via ORAL
  Filled 2015-04-14 (×9): qty 1

## 2015-04-14 MED ORDER — ZOLPIDEM TARTRATE 5 MG PO TABS
5.0000 mg | ORAL_TABLET | Freq: Every evening | ORAL | Status: DC | PRN
  Administered 2015-04-14 – 2015-04-22 (×9): 5 mg via ORAL
  Filled 2015-04-14 (×10): qty 1

## 2015-04-14 NOTE — Progress Notes (Signed)
Patient ID: Carrie Bean, female   DOB: 05-14-1986, 29 y.o.   MRN: [id number] FACULTY PRACTICE ANTEPARTUM(COMPREHENSIVE) NOTE  FARDOWSA AUTHIER is a 29 y.o. [id number] at [redacted]w[redacted]d  who is admitted for vaginal bleeding in the setting of known placental abruption, now with PPROM and further bleeding episode on 04/13/15.    Length of Stay:  5 Days  Subjective: Patient was observed on L&D yesterday after PPROM and bleeding; was 3.5 cm dilated but did not progress in PTL.  She was transferred back to Antenatal Unit this morning.  She reports bloody fluid, no bright red blood. Patient reports the fetal movement as active. Patient reports uterine contraction  activity as none. Patient reports  vaginal bleeding as red, but less than before Patient describes fluid per vagina as red  Vitals:  Blood pressure 107/68, pulse 92, temperature 97.6 F (36.4 C), temperature source Oral, resp. rate 16, height  (1.778 m), weight 166 lb (75.297 kg), last menstrual period 08/28/2014, currently breastfeeding. Physical Examination: General appearance - alert, well appearing, and in no distress Heart - normal rate and regular rhythm Abdomen - soft, nontender, nondistended Fundal Height: size equals dates Cervical Exam: Not evaluated. . Extremities: extremities normal, atraumatic, no cyanosis or edema and Homans sign is negative, no sign of DVT Membranes:ruptured  Fetal Monitoring:  Baseline: 135 bpm, Variability: Good {> 6 bpm), Accelerations: Reactive, Decelerations: Absent and Toco: no contractions  Labs:  Results for orders placed or performed during the hospital encounter of 04/09/15 (from the past 24 hour(s))  Prepare RBC   Collection Time: 04/13/15  2:35 PM  Result Value Ref Range   Order Confirmation ORDER PROCESSED BY BLOOD BANK   CBC   Collection Time: 04/13/15  2:40 PM  Result Value Ref Range   WBC 8.8 4.0 - 10.5 K/uL   RBC 4.50 3.87 - 5.11 MIL/uL   Hemoglobin 11.1 (L) 12.0 - 15.0 g/dL   HCT 30.8 (L) 65.7 - 84.6 %   MCV 77.8 (L) 78.0 - 100.0 fL   MCH 24.7 (L) 26.0 - 34.0 pg   MCHC 31.7 30.0 - 36.0 g/dL   RDW 96.2 (H) 95.2 - 84.1 %   Platelets 222 150 - 400 K/uL  Type and screen [facility]    Collection Time: 04/13/15  2:40 PM  Result Value Ref Range   ABO/RH(D) B POS    Antibody Screen NEG    Sample Expiration 04/16/2015    Unit Number [id number]    Blood Component Type RBC LR PHER1    Unit division 00    Status of Unit ALLOCATED    Transfusion Status OK TO TRANSFUSE    Crossmatch Result Compatible    Unit Number [id number]    Blood Component Type RBC LR PHER2    Unit division 00    Status of Unit ALLOCATED    Transfusion Status OK TO TRANSFUSE    Crossmatch Result Compatible     Medications:  Scheduled . sodium chloride   Intravenous Once  . ampicillin (OMNIPEN) IV  2 g Intravenous 4 times per day  . azithromycin  1,000 mg Oral Once  . docusate sodium  100 mg Oral Daily  . prenatal multivitamin  1 tablet Oral Q1200   I have reviewed the patient's current medications.  ASSESSMENT: Principal Problem:   Placental abruption Active Problems:   Supervision of high-risk pregnancy   Pregnancy with history of pre-term labor   Preterm premature rupture of membranes (PPROM) with unknown  onset of labor  PLAN: Patient has 2 units of pRBCs held for her, will continue to hold T&S ordered every 3 days Maintain IV access at all times Will continue latency antibiotics (Amp and Azithromycin); no PTL for now.  Plan is for delivery at 34 weeks or earlier if indicated.  Patient understands that cesarean delivery may be needed for bleeding diathesis or for nonreassuring fetal status. Patient has already received betamethasone x 2 regimens Continue close inpatient monitoring   ANYANWU,UGONNA A, MD 04/14/2015,10:16 AM

## 2015-04-14 NOTE — Progress Notes (Signed)
Patient moved to antenatal

## 2015-04-15 DIAGNOSIS — O4593 Premature separation of placenta, unspecified, third trimester: Secondary | ICD-10-CM | POA: Diagnosis not present

## 2015-04-15 MED ORDER — LACTATED RINGERS IV SOLN
INTRAVENOUS | Status: DC
  Administered 2015-04-15 – 2015-04-17 (×5): via INTRAVENOUS

## 2015-04-15 MED ORDER — ACETAMINOPHEN 325 MG PO TABS
650.0000 mg | ORAL_TABLET | Freq: Four times a day (QID) | ORAL | Status: DC | PRN
  Administered 2015-04-15 – 2015-04-22 (×3): 650 mg via ORAL
  Filled 2015-04-15 (×3): qty 2

## 2015-04-15 NOTE — Progress Notes (Signed)
MD made aware of complaints of pain, and RN assessment. Per MD will watch over the next hour.

## 2015-04-15 NOTE — Progress Notes (Signed)
Attempted to get pt to reposition, pt refusing. No change in her vaginal bleeding, pt note don last peripad to have pink fluid. No contractions noted on the fetal monitor, toco readjusted.

## 2015-04-15 NOTE — Progress Notes (Signed)
Patient ID: Carrie Bean, female   DOB: 12/05/1985, 29 y.o.   MRN: [id number] FACULTY PRACTICE ANTEPARTUM(COMPREHENSIVE) NOTE  Carrie Bean is a 29 y.o. [id number] at 5051w6d  who is admitted for vaginal bleeding in the setting of known placental abruption, now with PPROM and further bleeding episode on 04/13/15.    Length of Stay:  6 Days  Subjective: She reports scant bloody fluid, no bright red blood. Patient reports the fetal movement as active. Patient reports uterine contraction  activity as none. Patient reports vaginal bleeding as scant Patient describes fluid per vagina as scant and bloody  Vitals:  Blood pressure 114/63, pulse 94, temperature 97.2 F (36.2 C), temperature source Oral, resp. rate 16, height 5\' 10"  (1.778 m), weight 166 lb (75.297 kg), last menstrual period 08/28/2014, currently breastfeeding. Physical Examination: General appearance - alert, well appearing, and in no distress Heart - normal rate and regular rhythm Abdomen - soft, nontender, nondistended Fundal Height: size equals dates Cervical Exam: Not evaluated . Extremities: extremities normal, atraumatic, no cyanosis or edema and Homans sign is negative, no sign of DVT Membranes:ruptured  Fetal Monitoring:  Baseline: 135 bpm, Variability: Good {> 6 bpm), Accelerations: Reactive, Decelerations: Absent and Toco: no contractions  Labs:  No results found for this or any previous visit (from the past 24 hour(s)).  Medications:  Scheduled . ampicillin (OMNIPEN) IV  2 g Intravenous 4 times per day  . docusate sodium  100 mg Oral Daily  . prenatal multivitamin  1 tablet Oral Q1200   I have reviewed the patient's current medications.  ASSESSMENT: Principal Problem:   Placental abruption Active Problems:   Supervision of high-risk pregnancy   Pregnancy with history of pre-term labor   Preterm premature rupture of membranes (PPROM) with unknown onset of labor  PLAN: Bleeding is stable for  now Patient has 2 units of pRBCs held for her, will continue to hold T&S ordered every 3 days Maintain IV access at all times Will continue latency antibiotics (Amp and Azithromycin); no PTL for now.  Plan is for delivery at 34 weeks or earlier if indicated.  Patient understands that cesarean delivery may be needed for bleeding diathesis or for nonreassuring fetal status. Patient has already received betamethasone x 2 regimens Continue close inpatient monitoring   Raetta Agostinelli A, MD 04/15/2015,7:54 AM

## 2015-04-16 DIAGNOSIS — O4590 Premature separation of placenta, unspecified, unspecified trimester: Secondary | ICD-10-CM

## 2015-04-16 DIAGNOSIS — R1013 Epigastric pain: Secondary | ICD-10-CM

## 2015-04-16 LAB — TYPE AND SCREEN
ABO/RH(D): B POS
ANTIBODY SCREEN: NEGATIVE

## 2015-04-16 LAB — CBC
HCT: 33.6 % — ABNORMAL LOW (ref 36.0–46.0)
HEMOGLOBIN: 10.4 g/dL — AB (ref 12.0–15.0)
MCH: 24.3 pg — ABNORMAL LOW (ref 26.0–34.0)
MCHC: 31 g/dL (ref 30.0–36.0)
MCV: 78.5 fL (ref 78.0–100.0)
PLATELETS: 214 10*3/uL (ref 150–400)
RBC: 4.28 MIL/uL (ref 3.87–5.11)
RDW: 17.3 % — ABNORMAL HIGH (ref 11.5–15.5)
WBC: 8.8 10*3/uL (ref 4.0–10.5)

## 2015-04-16 MED ORDER — AMOXICILLIN 500 MG PO CAPS
500.0000 mg | ORAL_CAPSULE | Freq: Three times a day (TID) | ORAL | Status: DC
  Administered 2015-04-16 – 2015-04-21 (×14): 500 mg via ORAL
  Filled 2015-04-16 (×18): qty 1

## 2015-04-16 MED ORDER — LACTATED RINGERS IV BOLUS (SEPSIS)
500.0000 mL | Freq: Once | INTRAVENOUS | Status: AC
  Administered 2015-04-16: 500 mL via INTRAVENOUS

## 2015-04-16 MED ORDER — PANTOPRAZOLE SODIUM 40 MG PO TBEC
40.0000 mg | DELAYED_RELEASE_TABLET | Freq: Every day | ORAL | Status: DC
  Administered 2015-04-16 – 2015-04-21 (×6): 40 mg via ORAL
  Filled 2015-04-16 (×7): qty 1

## 2015-04-16 MED ORDER — PROMETHAZINE HCL 25 MG PO TABS
25.0000 mg | ORAL_TABLET | Freq: Once | ORAL | Status: AC
  Administered 2015-04-16: 25 mg via ORAL
  Filled 2015-04-16: qty 1

## 2015-04-16 NOTE — Progress Notes (Signed)
Patient ID: Alvy BimlerSieddha D Rodenbaugh, female   DOB: 12/13/1985, 29 y.o.   MRN: [id number] FACULTY PRACTICE ANTEPARTUM(COMPREHENSIVE) NOTE  Alvy BimlerSieddha D Naval is a 29 y.o. [id number] at 8638w6d  who is admitted for vaginal bleeding in the setting of known placental abruption, now with PPROM and further bleeding episode on 04/13/15.    Length of Stay:  7 Days  Subjective: She reports scant bloody fluid, no bright red blood.  Having epigastric pain - no palliating or provoking factors.  Pain fairly constant.  Pain sharp ache.   Patient reports the fetal movement as active. Patient reports uterine contraction  activity as none. Patient reports vaginal bleeding as scant Patient describes fluid per vagina as scant and bloody  Vitals:  Blood pressure 115/66, pulse 103, temperature 97.7 F (36.5 C), temperature source Oral, resp. rate 18, height 5\' 10"  (1.778 m), weight 166 lb (75.297 kg), last menstrual period 08/28/2014, currently breastfeeding. Physical Examination: General appearance - alert, well appearing, and in no distress Heart: regular rate, no murmur Lungs: clear to auscultation bilaterally, no wheezing. Abdomen - soft, tender in the epigastric area., nondistended Fundal Height: size equals dates Cervical Exam: Not evaluated . Extremities: extremities normal, atraumatic, no cyanosis or edema and Homans sign is negative, no sign of DVT Membranes:ruptured  Fetal Monitoring:  Baseline: 135 bpm, Variability: Good {> 6 bpm), Accelerations: Reactive, Decelerations: Absent and Toco: no contractions  Labs:  Results for orders placed or performed during the hospital encounter of 04/09/15 (from the past 24 hour(s))  CBC   Collection Time: 04/16/15  5:45 AM  Result Value Ref Range   WBC 8.8 4.0 - 10.5 K/uL   RBC 4.28 3.87 - 5.11 MIL/uL   Hemoglobin 10.4 (L) 12.0 - 15.0 g/dL   HCT 14.733.6 (L) 82.936.0 - 56.246.0 %   MCV 78.5 78.0 - 100.0 fL   MCH 24.3 (L) 26.0 - 34.0 pg   MCHC 31.0 30.0 - 36.0 g/dL   RDW 13.017.3  (H) 86.511.5 - 15.5 %   Platelets 214 150 - 400 K/uL    Medications:  Scheduled . amoxicillin  500 mg Oral 3 times per day  . docusate sodium  100 mg Oral Daily  . prenatal multivitamin  1 tablet Oral Q1200   I have reviewed the patient's current medications.  ASSESSMENT: Principal Problem:   Placental abruption Active Problems:   Supervision of high-risk pregnancy   Pregnancy with history of pre-term labor   Preterm premature rupture of membranes (PPROM) with unknown onset of labor  PLAN: 1.  PPROM  Latency antibiotics - start PO amoxicillin  Deliver for signs/symptoms of intrauterine infection/inflammation  NST twice daily 2.  Placental Abruption  controlled 3.  Epigastric Pain  PPI for possible GERD  Levie HeritageJacob J Finneas Mathe, DO 04/16/2015,6:20 AM

## 2015-04-17 DIAGNOSIS — O42919 Preterm premature rupture of membranes, unspecified as to length of time between rupture and onset of labor, unspecified trimester: Secondary | ICD-10-CM

## 2015-04-17 LAB — TYPE AND SCREEN
ABO/RH(D): B POS
ANTIBODY SCREEN: NEGATIVE
UNIT DIVISION: 0
Unit division: 0

## 2015-04-17 MED ORDER — OXYCODONE-ACETAMINOPHEN 5-325 MG PO TABS
1.0000 | ORAL_TABLET | Freq: Once | ORAL | Status: AC
  Administered 2015-04-17: 1 via ORAL
  Filled 2015-04-17: qty 1

## 2015-04-17 NOTE — Progress Notes (Signed)
Initial Nutrition Assessment  DOCUMENTATION CODES:   Not applicable  INTERVENTION:  Regular diet May order double protein portions, snacks TID and from retail  NUTRITION DIAGNOSIS:   Increased nutrient needs related to  (pregnancy and fetal growth requirements) as evidenced by  ([redacted] weeks pregnant).    GOAL:   Patient will meet greater than or equal to 90% of their needs, Weight gain   MONITOR:   Weight trends  REASON FOR ASSESSMENT:   Antenatal    ASSESSMENT:   33 1/7 weks, PROM, abaruption. Pre-pregnancy weight of 146 lbs, 20 lb weight gain. Weight gain goals 25-35 lbs. Pt reports decreased appetite, no n/v. Trying to eat adequate amts. Ofered snack and retail menus  Diet Order:  Diet regular Room service appropriate?: Yes; Fluid consistency:: Thin; Fluid restriction:: Other (see comments)  Skin:  Reviewed, no issues  Height:   Ht Readings from Last 1 Encounters:  04/09/15 5\' 10"  (1.778 m)    Weight:   Wt Readings from Last 1 Encounters:  04/09/15 166 lb (75.297 kg)    Ideal Body Weight:  68.1 kg  BMI:  Body mass index is 23.82 kg/(m^2).  Estimated Nutritional Needs:   Kcal:  2000-2200  Protein:  85-95 g  Fluid:  2.3 L  EDUCATION NEEDS:   No education needs identified at this time  Inez PilgrimKatherine Zelma Snead M.Odis LusterEd. R.D. LDN Neonatal Nutrition Support Specialist/RD III Pager [phone]      Phone [phone]

## 2015-04-17 NOTE — Progress Notes (Signed)
Patient ID: Carrie BimlerSieddha D Bean, female   DOB: 04/23/1986, 29 y.o.   MRN: [id number] FACULTY PRACTICE ANTEPARTUM(COMPREHENSIVE) NOTE  Carrie BimlerSieddha D Boliver is a 29 y.o. [id number] at 2314w1d  who is admitted for vaginal bleeding in the setting of known placental abruption, now with PPROM and further bleeding episode on 04/13/15.    Length of Stay:  8 Days  Subjective: She reports scant bloody fluid, no bright red blood. She also reports lower pelvic pressure unchanged from previous days.   Patient reports the fetal movement as active. Patient reports uterine contraction  activity as none. Patient reports vaginal bleeding as scant Patient describes fluid per vagina as scant and bloody  Vitals:  Blood pressure 98/40, pulse 93, temperature 97.7 F (36.5 C), temperature source Oral, resp. rate 18, height 5\' 10"  (1.778 m), weight 166 lb (75.297 kg), last menstrual period 08/28/2014, currently breastfeeding. Physical Examination: General appearance - alert, well appearing, and in no distress Heart: regular rate, no murmur Lungs: clear to auscultation bilaterally, no wheezing. Abdomen - soft, tender in the epigastric area., nondistended Fundal Height: size equals dates Cervical Exam: Not evaluated . Extremities: extremities normal, atraumatic, no cyanosis or edema and Homans sign is negative, no sign of DVT Membranes:ruptured  Fetal Monitoring:  Baseline: 130 bpm, Variability: Good {> 6 bpm), Accelerations: Reactive, Decelerations: Absent and Toco: no contractions  Labs:  No results found for this or any previous visit (from the past 24 hour(s)).  Medications:  Scheduled . amoxicillin  500 mg Oral 3 times per day  . docusate sodium  100 mg Oral Daily  . pantoprazole  40 mg Oral Daily  . prenatal multivitamin  1 tablet Oral Q1200   I have reviewed the patient's current medications.  ASSESSMENT: Principal Problem:   Placental abruption Active Problems:   Supervision of high-risk pregnancy  Pregnancy with history of pre-term labor   Preterm premature rupture of membranes (PPROM) with unknown onset of labor   Abdominal pain, epigastric  PLAN: 1.  PPROM  Continue latency antibiotics   Deliver for at 34 weeks or with signs/symptoms of intrauterine infection/inflammation  NST twice daily 2.  Placental Abruption  controlled   Maimouna Rondeau, MD 04/17/2015,7:15 AM

## 2015-04-17 NOTE — Progress Notes (Signed)
Patient ID: Carrie BimlerSieddha D Snapp, female   DOB: 11/21/1985, 29 y.o.   MRN: [id number] Asked to see paient.  She is complaining of worsening vaginal pressure through the day.  S/p one Percocet, which did not help.  No increased bleeding, FHR is reassuring. SSE reveals enlarged cervix and dilation of possibly 2-3 cm.  Old blood noted. Will continue to monitor with watching for labor.

## 2015-04-18 ENCOUNTER — Ambulatory Visit: Payer: Medicaid Other

## 2015-04-18 ENCOUNTER — Telehealth: Payer: Self-pay | Admitting: General Practice

## 2015-04-18 MED ORDER — SODIUM CHLORIDE 0.9 % IJ SOLN: 3.0000 mL | INTRAMUSCULAR | Status: DC | PRN

## 2015-04-18 MED ORDER — SODIUM CHLORIDE 0.9 % IJ SOLN
3.0000 mL | Freq: Two times a day (BID) | INTRAMUSCULAR | Status: DC
  Administered 2015-04-18 – 2015-04-20 (×4): 3 mL via INTRAVENOUS

## 2015-04-18 NOTE — Progress Notes (Signed)
Patient ID: Carrie Bean, female   DOB: 03/03/1986, 29 y.o.   MRN: [id number] Patient ID: Carrie Bean, female   DOB: 08/01/1985, 29 y.o.   MRN: [id number] FACULTY PRACTICE ANTEPARTUM(COMPREHENSIVE) NOTE  Carrie Bean is a 29 y.o. [id number] at 5756w2d   who is admitted for vaginal bleeding in the setting of known placental abruption, now with PPROM and further bleeding episode on 04/13/15.    Length of Stay:  9 Days  Subjective: She reports scant bloody fluid, no bright red blood. She also reports lower pelvic pressure unchanged from previous days.   Patient reports the fetal movement as active. Patient reports uterine contraction  activity as none. Patient reports vaginal bleeding as scant Patient describes fluid per vagina as scant and bloody  Vitals:  Blood pressure 98/51, pulse 90, temperature 98.2 F (36.8 C), temperature source Oral, resp. rate 16, height 5\' 10"  (1.778 m), weight 166 lb (75.297 kg), last menstrual period 08/28/2014, currently breastfeeding. Physical Examination: General appearance - alert, well appearing, and in no distress Heart: regular rate, no murmur Lungs: clear to auscultation bilaterally, no wheezing. Abdomen - soft, tender in the epigastric area., nondistended Fundal Height: size equals dates Cervical Exam: Not evaluated . Extremities: extremities normal, atraumatic, no cyanosis or edema and Homans sign is negative, no sign of DVT Membranes:ruptured  Fetal Monitoring:  Baseline: 130 bpm, Variability: Good {> 6 bpm), Accelerations: Reactive, Decelerations: Absent and Toco: no contractions  Labs:  No results found for this or any previous visit (from the past 24 hour(s)).  Medications:  Scheduled . amoxicillin  500 mg Oral 3 times per day  . docusate sodium  100 mg Oral Daily  . pantoprazole  40 mg Oral Daily  . prenatal multivitamin  1 tablet Oral Q1200   I have reviewed the patient's current medications.  ASSESSMENT: Principal Problem:  Placental abruption Active Problems:   Supervision of high-risk pregnancy   Pregnancy with history of pre-term labor   Preterm premature rupture of membranes (PPROM) with unknown onset of labor   Abdominal pain, epigastric  PLAN: 1.  PPROM  Continue latency antibiotics   Deliver for at 34 weeks or with signs/symptoms of intrauterine infection/inflammation  NST twice daily 2.  Placental Abruption  controlled   Lazaro ArmsLuther H Ginny Loomer, MD 04/18/2015,8:17 AM

## 2015-04-18 NOTE — Telephone Encounter (Signed)
Patient's mom called and left message that her current FMLA will be running out and her daughter is being induced Monday night into Tuesday morning. Patient's mom states she will need additional FMLA papers filled out which her job has provided. She would like a callback about how she needs to go about this. Called her back and informed her that she may come by tomorrow and drop the new set of FMLA papers off as we ask for 7-10 business days to complete them. Told her to let the front office know she does not need to sign a ROI because her daughter has already filled one out previously. She verbalized understanding and had no additional questions

## 2015-04-19 ENCOUNTER — Ambulatory Visit (HOSPITAL_COMMUNITY)
Admission: RE | Admit: 2015-04-19 | Discharge: 2015-04-19 | Disposition: A | Discharge status: Home / Self Care | Payer: Medicaid Other | Source: Ambulatory Visit | Attending: Family | Admitting: Family

## 2015-04-19 ENCOUNTER — Ambulatory Visit (HOSPITAL_COMMUNITY): Payer: Medicaid Other

## 2015-04-19 DIAGNOSIS — O468X9 Other antepartum hemorrhage, unspecified trimester: Secondary | ICD-10-CM

## 2015-04-19 DIAGNOSIS — O09213 Supervision of pregnancy with history of pre-term labor, third trimester: Secondary | ICD-10-CM | POA: Insufficient documentation

## 2015-04-19 DIAGNOSIS — O418X90 Other specified disorders of amniotic fluid and membranes, unspecified trimester, not applicable or unspecified: Secondary | ICD-10-CM

## 2015-04-19 DIAGNOSIS — Z3A33 33 weeks gestation of pregnancy: Secondary | ICD-10-CM

## 2015-04-19 DIAGNOSIS — O4593 Premature separation of placenta, unspecified, third trimester: Secondary | ICD-10-CM | POA: Insufficient documentation

## 2015-04-19 DIAGNOSIS — O09893 Supervision of other high risk pregnancies, third trimester: Secondary | ICD-10-CM

## 2015-04-19 LAB — CBC
HCT: 33 % — ABNORMAL LOW (ref 36.0–46.0)
Hemoglobin: 10.3 g/dL — ABNORMAL LOW (ref 12.0–15.0)
MCH: 24.4 pg — ABNORMAL LOW (ref 26.0–34.0)
MCHC: 31.2 g/dL (ref 30.0–36.0)
MCV: 78.2 fL (ref 78.0–100.0)
PLATELETS: 214 10*3/uL (ref 150–400)
RBC: 4.22 MIL/uL (ref 3.87–5.11)
RDW: 17.5 % — ABNORMAL HIGH (ref 11.5–15.5)
WBC: 11 10*3/uL — AB (ref 4.0–10.5)

## 2015-04-19 LAB — TYPE AND SCREEN
ABO/RH(D): B POS
Antibody Screen: NEGATIVE

## 2015-04-19 NOTE — Progress Notes (Signed)
Patient ID: Carrie Bean Scribner, female   DOB: 07/25/1985, 29 y.o.   MRN: [id number] FACULTY PRACTICE ANTEPARTUM(COMPREHENSIVE) NOTE  Carrie Bean Voyles is a 29 y.o. L2G4010G5P3104 at 670w6d  who is admitted for vaginal bleeding in the setting of known placental abruption, now with PPROM and further bleeding episode on 04/13/15.    Length of Stay:  10 Days  Subjective: She reports scant bloody fluid, no bright red blood.  Lower pelvic pressure unchanged     Patient reports the fetal movement as active. Patient reports uterine contraction  activity as none. Patient reports vaginal bleeding as scant Patient describes fluid per vagina as scant and bloody  Vitals:  Blood pressure 107/60, pulse 93, temperature 98.2 F (36.8 C), temperature source Oral, resp. rate 16, height 5\' 10"  (1.778 m), weight 166 lb (75.297 kg), last menstrual period 08/28/2014, currently breastfeeding. Physical Examination: General appearance - alert, well appearing, and in no distress Heart: regular rate, no murmur Lungs: clear to auscultation bilaterally, no wheezing. Abdomen - soft, nondistended Fundal Height: size equals dates Cervical Exam: Not evaluated . Extremities: extremities normal, atraumatic, no cyanosis or edema and Homans sign is negative, no sign of DVT Membranes:ruptured  Fetal Monitoring:  Baseline: 135 bpm, Variability: Good {> 6 bpm), Accelerations: Reactive, Decelerations: Absent and Toco: no contractions  Labs:  Results for orders placed or performed during the hospital encounter of 04/09/15 (from the past 24 hour(s))  CBC   Collection Time: 04/19/15  5:35 AM  Result Value Ref Range   WBC 11.0 (H) 4.0 - 10.5 K/uL   RBC 4.22 3.87 - 5.11 MIL/uL   Hemoglobin 10.3 (L) 12.0 - 15.0 g/dL   HCT 27.233.0 (L) 53.636.0 - 64.446.0 %   MCV 78.2 78.0 - 100.0 fL   MCH 24.4 (L) 26.0 - 34.0 pg   MCHC 31.2 30.0 - 36.0 g/dL   RDW 03.417.5 (H) 74.211.5 - 59.515.5 %   Platelets 214 150 - 400 K/uL    Medications:  Scheduled . amoxicillin   500 mg Oral 3 times per day  . docusate sodium  100 mg Oral Daily  . pantoprazole  40 mg Oral Daily  . prenatal multivitamin  1 tablet Oral Q1200  . sodium chloride  3 mL Intravenous Q12H   I have reviewed the patient's current medications.  ASSESSMENT: Principal Problem:   Placental abruption Active Problems:   Supervision of high-risk pregnancy   Pregnancy with history of pre-term labor   Preterm premature rupture of membranes (PPROM) with unknown onset of labor   Abdominal pain, epigastric  PLAN: 1.  PPROM  Latency antibiotics - start PO amoxicillin  Deliver for signs/symptoms of intrauterine infection/inflammation  NST twice daily  US for growth today 2.  Placental Abruption  Controlled   Levie HeritageJacob J Datron Brakebill, DO 04/19/2015,8:06 AM

## 2015-04-20 NOTE — Progress Notes (Signed)
Patient ID: Alvy BimlerSieddha D Sweetser, female   DOB: 01/07/1986, 29 y.o.   MRN: [id number] Patient ID: Alvy BimlerSieddha D Roh, female   DOB: 04/29/1986, 29 y.o.   MRN: [id number] FACULTY PRACTICE ANTEPARTUM(COMPREHENSIVE) NOTE  Alvy BimlerSieddha D Beshara is a 29 y.o. [id number] at 5392w4d   who is admitted for vaginal bleeding in the setting of known placental abruption, now with PPROM and further bleeding episode on 04/13/15.    Length of Stay:  11 Days  Subjective: She reports scant bloody fluid, no bright red blood.  Lower pelvic pressure unchanged     Patient reports the fetal movement as active. Patient reports uterine contraction  activity as none. Patient reports vaginal bleeding as scant Patient describes fluid per vagina as scant and bloody  Vitals:  Blood pressure 111/55, pulse 91, temperature 97.6 F (36.4 C), temperature source Oral, resp. rate 16, height 5\' 10"  (1.778 m), weight 166 lb (75.297 kg), last menstrual period 08/28/2014, currently breastfeeding. Physical Examination: General appearance - alert, well appearing, and in no distress Heart: regular rate, no murmur Lungs: clear to auscultation bilaterally, no wheezing. Abdomen - soft, nondistended Fundal Height: size equals dates Cervical Exam: Not evaluated . Extremities: extremities normal, atraumatic, no cyanosis or edema and Homans sign is negative, no sign of DVT Membranes:ruptured  Fetal Monitoring:  Baseline: 135 bpm, Variability: Good {> 6 bpm), Accelerations: Reactive, Decelerations: Absent and Toco: no contractions  Labs:  No results found for this or any previous visit (from the past 24 hour(s)).  Medications:  Scheduled . amoxicillin  500 mg Oral 3 times per day  . docusate sodium  100 mg Oral Daily  . pantoprazole  40 mg Oral Daily  . prenatal multivitamin  1 tablet Oral Q1200  . sodium chloride  3 mL Intravenous Q12H   I have reviewed the patient's current medications.  ASSESSMENT: Principal Problem:   Placental  abruption Active Problems:   Supervision of high-risk pregnancy   Pregnancy with history of pre-term labor   Preterm premature rupture of membranes (PPROM) with unknown onset of labor   Abdominal pain, epigastric  PLAN: 1.  PPROM  Latency antibiotics - start PO amoxicillin  Deliver for signs/symptoms of intrauterine infection/inflammation  NST twice daily  US for growth today 2.  Placental Abruption  Controlled  Plan induction at 34 weeks, 11/15   Lazaro ArmsLuther H Eure, MD 04/20/2015,7:51 AM

## 2015-04-20 NOTE — Plan of Care (Signed)
Problem: Physical Regulation: Goal: Will remain free from infection Outcome: Progressing No reports of s/s of infection this shift.

## 2015-04-21 DIAGNOSIS — O42913 Preterm premature rupture of membranes, unspecified as to length of time between rupture and onset of labor, third trimester: Secondary | ICD-10-CM

## 2015-04-21 LAB — CULTURE, BETA STREP (GROUP B ONLY)

## 2015-04-21 NOTE — Progress Notes (Signed)
Patient ID: Carrie Bean, female   DOB: 06/02/1986, 29 y.o.   MRN: [id number] FACULTY PRACTICE ANTEPARTUM(COMPREHENSIVE) NOTE  Carrie Bean is a 29 y.o. [id number] at 6544w5d by best clinical estimate who is admitted for PROM.   Fetal presentation is cephalic. Length of Stay:  12  Days  Subjective: Doing well.  Describes less leaking. Patient reports the fetal movement as active. Patient reports uterine contraction  activity as none. Patient reports  vaginal bleeding as none. Patient describes fluid per vagina as Clear.  Vitals:  Blood pressure 123/76, pulse 101, temperature 98 F (36.7 C), temperature source Oral, resp. rate 20, height 5\' 10"  (1.778 m), weight 166 lb (75.297 kg), last menstrual period 08/28/2014, currently breastfeeding. Physical Examination:  General appearance - alert, well appearing, and in no distress Chest - normal effort Abdomen - gravid, NT Fundal Height:  size equals dates Extremities: extremities normal, atraumatic, no cyanosis or edema  Membranes:ruptured, clear fluid  Fetal Monitoring:  Baseline: 130 bpm, Variability: Good {> 6 bpm), Accelerations: Reactive and Decelerations: Absent  Labs:  No results found for this or any previous visit (from the past 24 hour(s)).  Imaging Studies:    U/S shows appropriate growth, vtx, normal fluid.  Medications:  Scheduled . amoxicillin  500 mg Oral 3 times per day  . docusate sodium  100 mg Oral Daily  . pantoprazole  40 mg Oral Daily  . prenatal multivitamin  1 tablet Oral Q1200  . sodium chloride  3 mL Intravenous Q12H   I have reviewed the patient's current medications.  ASSESSMENT: Principal Problem:   Placental abruption Active Problems:   Supervision of high-risk pregnancy   Pregnancy with history of pre-term labor   Preterm premature rupture of membranes (PPROM) with unknown onset of labor   Abdominal pain, epigastric   PLAN: Will dc amoxicillin.   Discussed normal fluid on u/s and  possibly ruling ROM out again. Patient was not happy with that plan. Will re-visit tomorrow Current plan is for delivery at 34 weeks or sooner with s/sx's of labor or chorioamnionitis.  Reva BoresPRATT,Deno Sida S, MD 04/21/2015,7:45 AM

## 2015-04-21 NOTE — Progress Notes (Signed)
Pt expressing concern about the possibility of not being induced on Tuesday. Pt requesting to talk to a second physician about plan of care.

## 2015-04-22 LAB — TYPE AND SCREEN
ABO/RH(D): B POS
Antibody Screen: NEGATIVE

## 2015-04-22 LAB — URINALYSIS, ROUTINE W REFLEX MICROSCOPIC
BILIRUBIN URINE: NEGATIVE
Glucose, UA: NEGATIVE mg/dL
Ketones, ur: NEGATIVE mg/dL
Nitrite: NEGATIVE
PH: 7 (ref 5.0–8.0)
Protein, ur: NEGATIVE mg/dL
SPECIFIC GRAVITY, URINE: 1.02 (ref 1.005–1.030)
Urobilinogen, UA: 2 mg/dL — ABNORMAL HIGH (ref 0.0–1.0)

## 2015-04-22 LAB — CBC
HEMATOCRIT: 33.7 % — AB (ref 36.0–46.0)
HEMOGLOBIN: 10.6 g/dL — AB (ref 12.0–15.0)
MCH: 24.5 pg — AB (ref 26.0–34.0)
MCHC: 31.5 g/dL (ref 30.0–36.0)
MCV: 78 fL (ref 78.0–100.0)
PLATELETS: 222 10*3/uL (ref 150–400)
RBC: 4.32 MIL/uL (ref 3.87–5.11)
RDW: 17.5 % — ABNORMAL HIGH (ref 11.5–15.5)
WBC: 9.5 10*3/uL (ref 4.0–10.5)

## 2015-04-22 LAB — URINE MICROSCOPIC-ADD ON

## 2015-04-22 NOTE — Progress Notes (Signed)
Patient ID: Carrie Bean, female   DOB: 04/03/1986, 29 y.o.   MRN: [id number] Patient ID: Carrie BimlerSieddha D Kittrell, female   DOB: 03/22/1986, 29 y.o.   MRN: [id number] FACULTY PRACTICE ANTEPARTUM(COMPREHENSIVE) NOTE  Carrie BimlerSieddha D Ambrose is a 29 y.o. [id number] at 3972w6d  by best clinical estimate who is admitted for PROM.   Fetal presentation is cephalic. Length of Stay:  13  Days  Subjective: Doing well.  Describes less leaking. Patient reports the fetal movement as active. Patient reports uterine contraction  activity as none. Patient reports  vaginal bleeding as none. Patient describes fluid per vagina as Clear.  Vitals:  Blood pressure 107/67, pulse 97, temperature 97.8 F (36.6 C), temperature source Axillary, resp. rate 16, height 5\' 10"  (1.778 m), weight 166 lb (75.297 kg), last menstrual period 08/28/2014, currently breastfeeding. Physical Examination:  General appearance - alert, well appearing, and in no distress Chest - normal effort Abdomen - gravid, NT Fundal Height:  size equals dates Extremities: extremities normal, atraumatic, no cyanosis or edema  Membranes:ruptured, clear fluid  Fetal Monitoring:  Baseline: 130 bpm, Variability: Good {> 6 bpm), Accelerations: Reactive and Decelerations: Absent  Labs:  Results for orders placed or performed during the hospital encounter of 04/09/15 (from the past 24 hour(s))  CBC   Collection Time: 04/22/15  5:09 AM  Result Value Ref Range   WBC 9.5 4.0 - 10.5 K/uL   RBC 4.32 3.87 - 5.11 MIL/uL   Hemoglobin 10.6 (L) 12.0 - 15.0 g/dL   HCT 44.033.7 (L) 34.736.0 - 42.546.0 %   MCV 78.0 78.0 - 100.0 fL   MCH 24.5 (L) 26.0 - 34.0 pg   MCHC 31.5 30.0 - 36.0 g/dL   RDW 95.617.5 (H) 38.711.5 - 56.415.5 %   Platelets 222 150 - 400 K/uL  Type and screen   Collection Time: 04/22/15  5:09 AM  Result Value Ref Range   ABO/RH(D) B POS    Antibody Screen NEG    Sample Expiration 04/25/2015     Imaging Studies:    U/S shows appropriate growth, vtx, normal  fluid.  Medications:  Scheduled . docusate sodium  100 mg Oral Daily  . pantoprazole  40 mg Oral Daily  . prenatal multivitamin  1 tablet Oral Q1200  . sodium chloride  3 mL Intravenous Q12H   I have reviewed the patient's current medications.  ASSESSMENT: Principal Problem:   Placental abruption Active Problems:   Supervision of high-risk pregnancy   Pregnancy with history of pre-term labor   Preterm premature rupture of membranes (PPROM) with unknown onset of labor   Abdominal pain, epigastric   PLAN: Current plan is for delivery at 34 weeks or sooner with s/sx's of labor or chorioamnionitis.  Pt scheduled for induction tomorrow am  Lazaro ArmsEURE,Teresha Hanks H, MD 04/22/2015,7:21 AM

## 2015-04-22 NOTE — H&P (Signed)
LABOR ADMISSION HISTORY AND PHYSICAL  Carrie Bean is a 29 y.o. 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 Initial hospitalization for vaginal bleeding was at 24w, at which time patient received BMZx2. Vaginal bleeding recurred at 31w. She received a rescue dose of BMZx2 at that time and received amoxicillin after SROM. Normal amniotic fluid volume at 13.7 cm on 04/19/15 despite PPROM.   She reports +FM, + contractions, no blurry vision, headaches or peripheral edema, and RUQ pain.  She plans on breast and bottle feeding. She requests BTL for birth control (consent signed 02/25/15). She has had continued LOF since PPROM on 04/13/15, usually just leaking but occasionally gushes. She continues to pass "old blood" but has been having bright red blood-tinged mucus.   Dating: By LMP c/w 8w U/S --->  Estimated Date of Delivery: 06/04/15  Sono:    , c/w LMP, SCH at inferior placental margin over internal os, cephalic presentation, longitudinal lie  , CWD, normal anatomy with limitations and 3.9x1.4x3.1 cm SCH at leading edge of placenta, no previa, breech presentation, longitudinal lie, 310 g, 45% EFW , CWD, anatomy normal (complete), SCH 3.5x1.2x5.9 cm along LUS, cephalic presentation, longitudinal lie , CWD, SCH 4.47x3.62x2.8 cm now along fundal aspect of placenta, cephalic presentation, longitudinal lie , CWD, SCH 8x1.3x3.4 cm, cephalic presentation, longitudinal lie, 741 g, 30% EFW  , AFI 12.02, BPP 8/8 , SCH 8,0x0.6x3.2 cm along right lateral placenta (improved from prior), AFI 11.91, 1236 g, 33% EFW , AFI 12.27, BPP 8/8 , SCH 7.5x5.5x0.7 cm (resolving), AFI 16.06, BPP 8/8, cephalic presentation, longitudinal lie , SCH 6.3x2.1x1.1 cm (roughly unchanged) but seen to course full width of placenta in one area, AFI 11.77 cm,  cephalic presentation, longitudinal lie , AFI 13.7, cephalic presentation, longitudinal lie, 1847 g, 29% EFW  Prenatal History/Complications:  Past Medical History: Past Medical History  Diagnosis Date  . Preterm labor   . Urinary tract infection   . Gonorrhea   . Chlamydia   . Abnormal Pap smear     f/u was normal  . Anemia     Past Surgical History: Past Surgical History  Procedure Laterality Date  . No past surgeries      Obstetrical History: OB History    Gravida Para Term Preterm AB TAB SAB Ectopic Multiple Living   0 0 0 0 0 4      Social History: Social History   Social History  . Marital Status: Married    Spouse Name: N/A  . Number of Children: N/A  . Years of Education: N/A   Social History Main Topics  . Smoking status: Never Smoker   . Smokeless tobacco: Never Used  . Alcohol Use: No  . Drug Use: No  . Sexual Activity: Yes    Birth Control/ Protection: None   Other Topics Concern  . None   Social History Narrative    Family History: Family History  Problem Relation Age of Onset  . Other Neg Hx   . Hearing loss Neg Hx   . Hyperlipidemia Mother   . Asthma Daughter   . Asthma Son   . Hypertension Maternal Grandmother   . Stroke Maternal Grandmother     Allergies: No Known Allergies  Facility-administered medications prior to admission  Medication Dose Route Frequency Provider Last Rate Last Dose  . hydroxyprogesterone caproate (DELALUTIN) 250 mg/mL  injection 250 mg  250 mg Intramuscular Weekly Reva Boresanya S Pratt, MD   250 mg at 04/04/15 [id number]   Prescriptions prior to admission  Medication Sig Dispense Refill Last Dose  . Prenatal Vit-Fe Fumarate-FA (PRENATAL COMPLETE) 14-0.4 MG TABS Take 14 mg by mouth daily. (Patient taking differently: Take 14 mg by mouth daily. ) 60 each 4 04/08/2015 at Unknown time     Review of Systems   All systems reviewed and negative except as stated in HPI  BP 109/73 mmHg  Pulse 104   Temp(Src) 97.5 F (36.4 C) (Oral)  Resp 18  Ht 5\' 10"  (1.778 m)  Wt 75.297 kg (166 lb)  BMI 23.82 kg/m2  LMP 08/28/2014 General appearance: alert, cooperative and no distress Lungs: clear to auscultation bilaterally Heart: regular rate and rhythm Abdomen: soft, non-tender; bowel sounds normal Pelvic: Cervic dilated to 2.5 cm.  Extremities: Homans sign is negative, no sign of DVT, edema Presentation: cephalic Fetal monitoringBaseline: 140 bpm, Variability: Good {> 6 bpm), Accelerations: Reactive and Decelerations: Variable: mild Uterine activityFrequency: Every 4-8 minutes Dilation: 2.5 Effacement (%): Thick Station: -2 Exam by:: Ansah-mensah, rnc    Prenatal labs: ABO, Rh: --/--/B POS (11/14 0509) Antibody: NEG (11/14 0509) Rubella: Immune RPR: NON REAC (10/06 1127)  HBsAg: NEGATIVE (08/08 1053)  HIV: NONREACTIVE (10/06 1127)  GBS: Negative (08/08 0000) Confirmed 04/19/15. 1 hr Glucola 110: (third trimester) Genetic screening: AFP, Quad screen negative [facility]  Maternal Diabetes: No Genetic Screening: Normal Maternal Ultrasounds/Referrals: Abnormal:  Findings:   Other:[facility] or other Referrals:  Other: Weekly BPPs with AFI measurements Maternal Substance Abuse:  No Significant Maternal Medications:  Meds include: Other: 17-P, BMZ (9/12, 913; rescue doses 10/29, 10/30) Significant Maternal Lab Results: Lab values include: Group B Strep negative  Results for orders placed or performed during the hospital encounter of 04/09/15 (from the past 24 hour(s))  Urinalysis, Routine w reflex microscopic (not [facility])   Collection Time: 04/22/15  6:55 PM  Result Value Ref Range   Color, Urine YELLOW YELLOW   APPearance CLEAR CLEAR   Specific Gravity, Urine 1.020 1.005 - 1.030   pH 7.0 5.0 - 8.0   Glucose, UA NEGATIVE NEGATIVE mg/dL   Hgb urine dipstick MODERATE (A) NEGATIVE   Bilirubin Urine NEGATIVE NEGATIVE    Ketones, ur NEGATIVE NEGATIVE mg/dL   Protein, ur NEGATIVE NEGATIVE mg/dL   Urobilinogen, UA 2.0 (H) 0.0 - 1.0 mg/dL   Nitrite NEGATIVE NEGATIVE   Leukocytes, UA TRACE (A) NEGATIVE  Urine microscopic-add on   Collection Time: 04/22/15  6:55 PM  Result Value Ref Range   Squamous Epithelial / LPF MANY (A) RARE   WBC, UA 0-2 <3 WBC/hpf   RBC / HPF 3-6 <3 RBC/hpf   Bacteria, UA FEW (A) RARE   Urine-Other MUCOUS PRESENT   CBC   Collection Time: 04/23/15  7:00 AM  Result Value Ref Range   WBC 8.6 4.0 - 10.5 K/uL   RBC 4.45 3.87 - 5.11 MIL/uL   Hemoglobin 10.9 (L) 12.0 - 15.0 g/dL   HCT 96.034.9 (L) 45.436.0 - 09.846.0 %   MCV 78.4 78.0 - 100.0 fL   MCH 24.5 (L) 26.0 - 34.0 pg   MCHC 31.2 30.0 - 36.0 g/dL   RDW 11.917.6 (H) 14.711.5 - 82.915.5 %   Platelets 245 150 - 400 K/uL    Patient Active Problem List   Diagnosis Date Noted  . Placenta abruptio, antepartum  04/23/2015  . Abdominal pain, epigastric   . Preterm premature rupture of membranes (PPROM) with unknown onset of labor 04/14/2015  . Placental abruption 04/06/2015  . Pregnancy with history of pre-term labor 04/21/2013  . Supervision of high-risk pregnancy 04/13/2013    Assessment: Carrie Bean is a 29 y.o. [id number] at [redacted]w[redacted]d here for IOL for PPROM.   #Labor: Induction with pitocin, increasing 2x2.  #Pain: Planning on epidural #FWB: Cat I #ID:  GBS neg #MOF: Breast and bottle (concerned she will be unable to breastfeed due to preterm delivery) #MOC: BTL #Circ:  Outpatient @ baby's pediatrician  Dani Gobble, MD [facility], PGY-1  OB fellow attestation:  I have seen and examined this patient; I agree with above documentation in the resident's note.   Carrie Bean is a 29 y.o. [id number] here for IOL given ROM   PE: BP 118/76 mmHg  Pulse 89  Temp(Src) 98.2 F (36.8 C) (Oral)  Resp 16  Ht  (1.778 m)  Wt 166 lb (75.297 kg)  BMI 23.82 kg/m2  SpO2 98%  LMP 08/28/2014 Gen: calm comfortable,  NAD Resp: normal effort, no distress Abd: gravid  ROS, labs, PMH reviewed  Plan: -Labor: Favorable cervix, consider cytotec vs pitocin -FWB: reactive. S/p BMZ. Monitor closely given known placental abruption -MOF desires to breastfeed  Federico Flake, MD  Family Medicine, OB Fellow 04/23/2015, 10:03 AM

## 2015-04-22 NOTE — Plan of Care (Signed)
Problem: Pain Managment: Goal: General experience of comfort will improve Outcome: Progressing With mild medication     Problem: Activity: Goal: Risk for activity intolerance will decrease Outcome: Progressing Pt ordered bedrest, able to BR privlages

## 2015-04-23 ENCOUNTER — Encounter (HOSPITAL_COMMUNITY): Payer: Self-pay | Admitting: Anesthesiology

## 2015-04-23 ENCOUNTER — Inpatient Hospital Stay (HOSPITAL_COMMUNITY): Payer: Medicaid Other | Admitting: Anesthesiology

## 2015-04-23 DIAGNOSIS — Z3A34 34 weeks gestation of pregnancy: Secondary | ICD-10-CM

## 2015-04-23 DIAGNOSIS — O4590 Premature separation of placenta, unspecified, unspecified trimester: Secondary | ICD-10-CM | POA: Diagnosis present

## 2015-04-23 DIAGNOSIS — O42913 Preterm premature rupture of membranes, unspecified as to length of time between rupture and onset of labor, third trimester: Secondary | ICD-10-CM

## 2015-04-23 LAB — CBC
HCT: 34.9 % — ABNORMAL LOW (ref 36.0–46.0)
HEMOGLOBIN: 10.9 g/dL — AB (ref 12.0–15.0)
MCH: 24.5 pg — ABNORMAL LOW (ref 26.0–34.0)
MCHC: 31.2 g/dL (ref 30.0–36.0)
MCV: 78.4 fL (ref 78.0–100.0)
PLATELETS: 245 10*3/uL (ref 150–400)
RBC: 4.45 MIL/uL (ref 3.87–5.11)
RDW: 17.6 % — ABNORMAL HIGH (ref 11.5–15.5)
WBC: 8.6 10*3/uL (ref 4.0–10.5)

## 2015-04-23 LAB — RPR: RPR Ser Ql: NONREACTIVE

## 2015-04-23 MED ORDER — IBUPROFEN 600 MG PO TABS
600.0000 mg | ORAL_TABLET | Freq: Four times a day (QID) | ORAL | Status: DC
  Administered 2015-04-23 – 2015-04-25 (×7): 600 mg via ORAL
  Filled 2015-04-23 (×7): qty 1

## 2015-04-23 MED ORDER — TETANUS-DIPHTH-ACELL PERTUSSIS 5-2.5-18.5 LF-MCG/0.5 IM SUSP: 0.5000 mL | Freq: Once | INTRAMUSCULAR | Status: DC

## 2015-04-23 MED ORDER — ACETAMINOPHEN 325 MG PO TABS: 650.0000 mg | ORAL_TABLET | ORAL | Status: DC | PRN

## 2015-04-23 MED ORDER — METHYLERGONOVINE MALEATE 0.2 MG/ML IJ SOLN
0.2000 mg | Freq: Once | INTRAMUSCULAR | Status: AC
  Administered 2015-04-23: 0.2 mg via INTRAMUSCULAR

## 2015-04-23 MED ORDER — FAMOTIDINE 20 MG PO TABS
40.0000 mg | ORAL_TABLET | Freq: Once | ORAL | Status: AC
  Administered 2015-04-24: 40 mg via ORAL
  Filled 2015-04-23: qty 2

## 2015-04-23 MED ORDER — DIBUCAINE 1 % RE OINT: 1.0000 "application " | TOPICAL_OINTMENT | RECTAL | Status: DC | PRN

## 2015-04-23 MED ORDER — OXYCODONE-ACETAMINOPHEN 5-325 MG PO TABS: 1.0000 | ORAL_TABLET | ORAL | Status: DC | PRN

## 2015-04-23 MED ORDER — OXYCODONE-ACETAMINOPHEN 5-325 MG PO TABS: 2.0000 | ORAL_TABLET | ORAL | Status: DC | PRN

## 2015-04-23 MED ORDER — WITCH HAZEL-GLYCERIN EX PADS: 1.0000 "application " | MEDICATED_PAD | CUTANEOUS | Status: DC | PRN

## 2015-04-23 MED ORDER — LIDOCAINE HCL (PF) 1 % IJ SOLN: 30.0000 mL | INTRAMUSCULAR | Status: DC | PRN

## 2015-04-23 MED ORDER — OXYTOCIN 40 UNITS IN LACTATED RINGERS INFUSION - SIMPLE MED: 62.5000 mL/h | INTRAVENOUS | Status: DC

## 2015-04-23 MED ORDER — SENNOSIDES-DOCUSATE SODIUM 8.6-50 MG PO TABS
2.0000 | ORAL_TABLET | ORAL | Status: DC
  Administered 2015-04-24 (×2): 2 via ORAL
  Filled 2015-04-23 (×2): qty 2

## 2015-04-23 MED ORDER — SIMETHICONE 80 MG PO CHEW
80.0000 mg | CHEWABLE_TABLET | ORAL | Status: DC | PRN
  Administered 2015-04-25: 80 mg via ORAL
  Filled 2015-04-23: qty 1

## 2015-04-23 MED ORDER — OXYTOCIN BOLUS FROM INFUSION: 500.0000 mL | INTRAVENOUS | Status: DC

## 2015-04-23 MED ORDER — CITRIC ACID-SODIUM CITRATE 334-500 MG/5ML PO SOLN: 30.0000 mL | ORAL | Status: DC | PRN

## 2015-04-23 MED ORDER — LANOLIN HYDROUS EX OINT: TOPICAL_OINTMENT | CUTANEOUS | Status: DC | PRN

## 2015-04-23 MED ORDER — PRENATAL MULTIVITAMIN CH
1.0000 | ORAL_TABLET | Freq: Every day | ORAL | Status: DC
  Administered 2015-04-25: 1 via ORAL
  Filled 2015-04-23: qty 1

## 2015-04-23 MED ORDER — DIPHENHYDRAMINE HCL 50 MG/ML IJ SOLN: 12.5000 mg | INTRAMUSCULAR | Status: DC | PRN

## 2015-04-23 MED ORDER — ONDANSETRON HCL 4 MG/2ML IJ SOLN: 4.0000 mg | Freq: Four times a day (QID) | INTRAMUSCULAR | Status: DC | PRN

## 2015-04-23 MED ORDER — OXYCODONE-ACETAMINOPHEN 5-325 MG PO TABS
2.0000 | ORAL_TABLET | ORAL | Status: DC | PRN
  Administered 2015-04-25 (×2): 2 via ORAL
  Filled 2015-04-23 (×2): qty 2

## 2015-04-23 MED ORDER — LACTATED RINGERS IV SOLN: 500.0000 mL | INTRAVENOUS | Status: DC | PRN

## 2015-04-23 MED ORDER — ZOLPIDEM TARTRATE 5 MG PO TABS: 5.0000 mg | ORAL_TABLET | Freq: Every evening | ORAL | Status: DC | PRN

## 2015-04-23 MED ORDER — EPHEDRINE 5 MG/ML INJ
10.0000 mg | INTRAVENOUS | Status: DC | PRN
  Filled 2015-04-23: qty 2

## 2015-04-23 MED ORDER — METHYLERGONOVINE MALEATE 0.2 MG/ML IJ SOLN
INTRAMUSCULAR | Status: AC
  Administered 2015-04-23: 0.2 mg via INTRAMUSCULAR
  Filled 2015-04-23: qty 1

## 2015-04-23 MED ORDER — METOCLOPRAMIDE HCL 10 MG PO TABS
10.0000 mg | ORAL_TABLET | Freq: Once | ORAL | Status: AC
  Administered 2015-04-24: 10 mg via ORAL
  Filled 2015-04-23: qty 1

## 2015-04-23 MED ORDER — OXYTOCIN 40 UNITS IN LACTATED RINGERS INFUSION - SIMPLE MED
1.0000 m[IU]/min | INTRAVENOUS | Status: DC
  Administered 2015-04-23: 2 m[IU]/min via INTRAVENOUS
  Filled 2015-04-23: qty 1000

## 2015-04-23 MED ORDER — FENTANYL CITRATE (PF) 100 MCG/2ML IJ SOLN: 100.0000 ug | INTRAMUSCULAR | Status: DC | PRN

## 2015-04-23 MED ORDER — LACTATED RINGERS IV SOLN
INTRAVENOUS | Status: DC
  Administered 2015-04-23 (×2): via INTRAVENOUS

## 2015-04-23 MED ORDER — LIDOCAINE HCL (PF) 1 % IJ SOLN
INTRAMUSCULAR | Status: DC | PRN
  Administered 2015-04-23: 5 mL via EPIDURAL
  Administered 2015-04-23: 4 mL via EPIDURAL

## 2015-04-23 MED ORDER — PHENYLEPHRINE 40 MCG/ML (10ML) SYRINGE FOR IV PUSH (FOR BLOOD PRESSURE SUPPORT)
80.0000 ug | PREFILLED_SYRINGE | INTRAVENOUS | Status: DC | PRN
  Administered 2015-04-23: 80 ug via INTRAVENOUS
  Filled 2015-04-23: qty 2
  Filled 2015-04-23: qty 20

## 2015-04-23 MED ORDER — LACTATED RINGERS IV SOLN
INTRAVENOUS | Status: DC
  Administered 2015-04-24 (×2): via INTRAVENOUS

## 2015-04-23 MED ORDER — DIPHENHYDRAMINE HCL 25 MG PO CAPS: 25.0000 mg | ORAL_CAPSULE | Freq: Four times a day (QID) | ORAL | Status: DC | PRN

## 2015-04-23 MED ORDER — OXYCODONE-ACETAMINOPHEN 5-325 MG PO TABS
1.0000 | ORAL_TABLET | ORAL | Status: DC | PRN
  Administered 2015-04-23 – 2015-04-24 (×3): 1 via ORAL
  Filled 2015-04-23 (×3): qty 1

## 2015-04-23 MED ORDER — LACTATED RINGERS IV SOLN
500.0000 mL | INTRAVENOUS | Status: DC | PRN
Start: 2015-04-23 — End: 2015-04-23

## 2015-04-23 MED ORDER — LACTATED RINGERS IV SOLN: INTRAVENOUS | Status: DC

## 2015-04-23 MED ORDER — ONDANSETRON HCL 4 MG PO TABS: 4.0000 mg | ORAL_TABLET | ORAL | Status: DC | PRN

## 2015-04-23 MED ORDER — LIDOCAINE HCL (PF) 1 % IJ SOLN
30.0000 mL | INTRAMUSCULAR | Status: DC | PRN
  Filled 2015-04-23: qty 30

## 2015-04-23 MED ORDER — FENTANYL 2.5 MCG/ML BUPIVACAINE 1/10 % EPIDURAL INFUSION (WH - ANES)
14.0000 mL/h | INTRAMUSCULAR | Status: DC | PRN
  Administered 2015-04-23: 15 mL/h via EPIDURAL
  Filled 2015-04-23: qty 125

## 2015-04-23 MED ORDER — ONDANSETRON HCL 4 MG/2ML IJ SOLN: 4.0000 mg | INTRAMUSCULAR | Status: DC | PRN

## 2015-04-23 MED ORDER — BENZOCAINE-MENTHOL 20-0.5 % EX AERO
1.0000 | INHALATION_SPRAY | CUTANEOUS | Status: DC | PRN
Start: 2015-04-23 — End: 2015-04-25
  Administered 2015-04-24: 1 via TOPICAL
  Filled 2015-04-23: qty 56

## 2015-04-23 MED ORDER — PIPERACILLIN-TAZOBACTAM 3.375 G IVPB
3.3750 g | Freq: Once | INTRAVENOUS | Status: DC
  Administered 2015-04-23: 3.375 g via INTRAVENOUS
  Filled 2015-04-23: qty 50

## 2015-04-23 MED ORDER — TERBUTALINE SULFATE 1 MG/ML IJ SOLN: 0.2500 mg | Freq: Once | INTRAMUSCULAR | Status: DC | PRN

## 2015-04-23 NOTE — Progress Notes (Signed)
Pt to room 171. Report given to Ileene Rubensiana Mensah, RNC.

## 2015-04-23 NOTE — Anesthesia Preprocedure Evaluation (Signed)
Anesthesia Evaluation  Patient identified by MRN, date of birth, ID band Patient awake    Reviewed: Allergy & Precautions, Patient's Chart, lab work & pertinent test results  Airway Mallampati: II  TM Distance: >3 FB Neck ROM: Full    Dental no notable dental hx. (+) Teeth Intact   Pulmonary neg pulmonary ROS,    Pulmonary exam normal breath sounds clear to auscultation       Cardiovascular negative cardio ROS Normal cardiovascular exam Rhythm:Regular Rate:Normal     Neuro/Psych negative neurological ROS  negative psych ROS   GI/Hepatic negative GI ROS, Neg liver ROS,   Endo/Other  negative endocrine ROS  Renal/GU negative Renal ROS  negative genitourinary   Musculoskeletal negative musculoskeletal ROS (+)   Abdominal   Peds  Hematology  (+) anemia ,   Anesthesia Other Findings   Reproductive/Obstetrics (+) Pregnancy PTL 34 weeks                             Anesthesia Physical Anesthesia Plan  ASA: II  Anesthesia Plan: Epidural   Post-op Pain Management:    Induction:   Airway Management Planned: Natural Airway  Additional Equipment:   Intra-op Plan:   Post-operative Plan:   Informed Consent: I have reviewed the patients History and Physical, chart, labs and discussed the procedure including the risks, benefits and alternatives for the proposed anesthesia with the patient or authorized representative who has indicated his/her understanding and acceptance.     Plan Discussed with: Anesthesiologist  Anesthesia Plan Comments:         Anesthesia Quick Evaluation

## 2015-04-23 NOTE — Anesthesia Procedure Notes (Signed)
Epidural Patient location during procedure: OB Start time: 04/23/2015 2:17 PM  Staffing Anesthesiologist: Mal AmabileFOSTER, Jream Broyles Performed by: anesthesiologist   Preanesthetic Checklist Completed: patient identified, site marked, surgical consent, pre-op evaluation, timeout performed, IV checked, risks and benefits discussed and monitors and equipment checked  Epidural Patient position: sitting Prep: site prepped and draped and DuraPrep Patient monitoring: continuous pulse ox and blood pressure Approach: midline Location: L3-L4 Injection technique: LOR air  Needle:  Needle type: Tuohy  Needle gauge: 17 G Needle length: 9 cm and 9 Needle insertion depth: 5 cm cm Catheter type: closed end flexible Catheter size: 19 Gauge Catheter at skin depth: 10 cm Test dose: negative and Other  Assessment Events: blood not aspirated, injection not painful, no injection resistance, negative IV test and no paresthesia  Additional Notes Patient identified. Risks and benefits discussed including failed block, incomplete  Pain control, post dural puncture headache, nerve damage, paralysis, blood pressure Changes, nausea, vomiting, reactions to medications-both toxic and allergic and post Partum back pain. All questions were answered. Patient expressed understanding and wished to proceed. Sterile technique was used throughout procedure. Epidural site was Dressed with sterile barrier dressing. No paresthesias, signs of intravascular injection Or signs of intrathecal spread were encountered.  Patient was more comfortable after the epidural was dosed. Please see RN's note for documentation of vital signs and FHR which are stable.

## 2015-04-23 NOTE — Progress Notes (Signed)
Alvy BimlerSieddha D Canipe is a 29 y.o. [id number] at 4371w0d admitted for induction of labor due to PPROM and chronic abruption.  Subjective: Pt breathing with contractions, declines pain medication at this time.  Desires epidural at some point in labor.  Objective: BP 95/51 mmHg  Pulse 85  Temp(Src) 97.8 F (36.6 C) (Oral)  Resp 16  Ht 5\' 10"  (1.778 m)  Wt 75.297 kg (166 lb)  BMI 23.82 kg/m2  SpO2 98%  LMP 08/28/2014 I/O last 3 completed shifts: In: 1220 [P.O.:1220] Out: -     FHT:  FHR: 140 bpm, variability: moderate,  accelerations:  Present,  decelerations:  Present episodic variables UC:   regular, every 3-4 minutes SVE:   Deferred, MVUs 130s-150s, Pitocin increased slowly related to episodic variables Pitocin currently at 4 milliunits/min  Labs: Lab Results  Component Value Date   WBC 8.6 04/23/2015   HGB 10.9* 04/23/2015   HCT 34.9* 04/23/2015   MCV 78.4 04/23/2015   PLT 245 04/23/2015    Assessment / Plan: Induction of labor due to PPROM,  progressing well on pitocin  Labor: Plan to continue Pitocin at 4 milliunits/min for now, will notify RN when to increase  Preeclampsia:  n/a Fetal Wellbeing:  Category II Pain Control:  Labor support without medications I/D:  n/a Anticipated MOD:  NSVD  LEFTWICH-KIRBY, LISA 04/23/2015, 1:36 PM

## 2015-04-23 NOTE — Progress Notes (Signed)
Carrie Bean is a 29 y.o. [id number] at 7574w0d admitted for IOL for PPROM and chronic abruption  Subjective: Pt reports feeling intermittent cramping.    Objective: BP 91/53 mmHg  Pulse 90  Temp(Src) 98.5 F (36.9 C) (Oral)  Resp 15  Ht 5\' 10"  (1.778 m)  Wt 75.297 kg (166 lb)  BMI 23.82 kg/m2  LMP 08/28/2014 I/O last 3 completed shifts: In: 1220 [P.O.:1220] Out: -     FHT:  FHR: 145 bpm, variability: moderate,  accelerations:  Present,  decelerations:  Present deep variables every 2-3 minutes, RN notified CNM, pt repositioned, IV fluid bolus given UC:   regular, every 2-3 minutes, mild to moderate to palpation SVE:   Dilation: 2.5 Effacement (%): Thick Station: -3 Exam by:: L. Leftwich-Kirby, CNM FSE and IUPC placed during cervical exam.  Pt tolerated well.  Pitocin at 6 milliunits/min currently.  IUPC shows likely tachysystole so Pitocin off temporarily.  Labs: Lab Results  Component Value Date   WBC 8.6 04/23/2015   HGB 10.9* 04/23/2015   HCT 34.9* 04/23/2015   MCV 78.4 04/23/2015   PLT 245 04/23/2015    Assessment / Plan: Induction of labor due to PPROM and chronic abruption Pitocin off currently related to FHR tracing  Labor: Will monitor FHR tracing and evaluate need for amnioinfusion.  Pitocin to restart when FHR tracing reassuring. Preeclampsia:  n/a Fetal Wellbeing:  Category II Pain Control:  Labor support without medications I/D:  n/a Anticipated MOD:  NSVD  LEFTWICH-KIRBY, LISA 04/23/2015, 9:44 AM

## 2015-04-23 NOTE — Progress Notes (Signed)
Carrie Bean is a 29 y.o. [id number] at 5561w0d admitted for induction of labor due to PPROM and chronic abruption.  Subjective: Pt continues to report painful contractions 1 hour after epidural.  S/O in room for support.  Objective: BP 108/57 mmHg  Pulse 95  Temp(Src) 98.3 F (36.8 C) (Oral)  Resp 20  Ht 5\' 10"  (1.778 m)  Wt 75.297 kg (166 lb)  BMI 23.82 kg/m2  SpO2 98%  LMP 08/28/2014 I/O last 3 completed shifts: In: 1220 [P.O.:1220] Out: -     FHT:  FHR: 135 bpm, variability: moderate,  accelerations:  Present,  decelerations:  Present episodic variables UC:   regular, every 2-3 minutes SVE:   Dilation: 4 Effacement (%): 90 Station: -1 Exam by:: Arlyce DiceAmanda Harris, RN  Labs: Lab Results  Component Value Date   WBC 8.6 04/23/2015   HGB 10.9* 04/23/2015   HCT 34.9* 04/23/2015   MCV 78.4 04/23/2015   PLT 245 04/23/2015    Assessment / Plan: Augmentation of labor, progressing well  Labor: Continue to increase Pitocin per protocol, plan to redose epidural Preeclampsia:  n/a Fetal Wellbeing:  Category II Pain Control:  Epidural I/D:  n/a Anticipated MOD:  NSVD  LEFTWICH-KIRBY, Jeremaih Klima 04/23/2015, 3:19 PM

## 2015-04-24 ENCOUNTER — Encounter (HOSPITAL_COMMUNITY)
Admission: AD | Disposition: A | Discharge status: Home / Self Care | Payer: Self-pay | Source: Ambulatory Visit | Attending: Obstetrics & Gynecology

## 2015-04-24 ENCOUNTER — Encounter (HOSPITAL_COMMUNITY): Payer: Self-pay | Admitting: Anesthesiology

## 2015-04-24 ENCOUNTER — Inpatient Hospital Stay (HOSPITAL_COMMUNITY): Payer: Medicaid Other | Admitting: Certified Registered Nurse Anesthetist

## 2015-04-24 DIAGNOSIS — Z3202 Encounter for pregnancy test, result negative: Secondary | ICD-10-CM

## 2015-04-24 HISTORY — PX: TUBAL LIGATION: SHX77

## 2015-04-24 LAB — CBC
HEMATOCRIT: 29.6 % — AB (ref 36.0–46.0)
HEMOGLOBIN: 9.4 g/dL — AB (ref 12.0–15.0)
MCH: 24.7 pg — AB (ref 26.0–34.0)
MCHC: 31.8 g/dL (ref 30.0–36.0)
MCV: 77.9 fL — ABNORMAL LOW (ref 78.0–100.0)
Platelets: 176 10*3/uL (ref 150–400)
RBC: 3.8 MIL/uL — ABNORMAL LOW (ref 3.87–5.11)
RDW: 17.7 % — ABNORMAL HIGH (ref 11.5–15.5)
WBC: 11.3 10*3/uL — ABNORMAL HIGH (ref 4.0–10.5)

## 2015-04-24 LAB — MRSA PCR SCREENING: MRSA by PCR: NEGATIVE

## 2015-04-24 SURGERY — LIGATION, FALLOPIAN TUBE, POSTPARTUM
Anesthesia: Epidural | Site: Abdomen | Laterality: Bilateral

## 2015-04-24 SURGERY — LIGATION, FALLOPIAN TUBE, POSTPARTUM
Anesthesia: Epidural | Laterality: Bilateral

## 2015-04-24 MED ORDER — BUPIVACAINE IN DEXTROSE 0.75-8.25 % IT SOLN
INTRATHECAL | Status: DC | PRN
  Administered 2015-04-24: 1.6 mg via INTRATHECAL

## 2015-04-24 MED ORDER — FENTANYL CITRATE (PF) 100 MCG/2ML IJ SOLN
25.0000 ug | INTRAMUSCULAR | Status: DC | PRN
  Administered 2015-04-24 (×2): 25 ug via INTRAVENOUS

## 2015-04-24 MED ORDER — FENTANYL CITRATE (PF) 100 MCG/2ML IJ SOLN
INTRAMUSCULAR | Status: AC
  Administered 2015-04-24: 25 ug via INTRAVENOUS
  Filled 2015-04-24: qty 2

## 2015-04-24 MED ORDER — ONDANSETRON HCL 4 MG/2ML IJ SOLN: 4.0000 mg | Freq: Once | INTRAMUSCULAR | Status: DC | PRN

## 2015-04-24 MED ORDER — LIDOCAINE-EPINEPHRINE (PF) 2 %-1:200000 IJ SOLN
INTRAMUSCULAR | Status: AC
  Filled 2015-04-24: qty 20

## 2015-04-24 MED ORDER — FENTANYL CITRATE (PF) 100 MCG/2ML IJ SOLN
INTRAMUSCULAR | Status: AC
  Filled 2015-04-24: qty 2

## 2015-04-24 MED ORDER — SODIUM BICARBONATE 8.4 % IV SOLN
INTRAVENOUS | Status: AC
  Filled 2015-04-24: qty 50

## 2015-04-24 MED ORDER — BUPIVACAINE HCL (PF) 0.25 % IJ SOLN
INTRAMUSCULAR | Status: DC | PRN
  Administered 2015-04-24: 8 mL

## 2015-04-24 MED ORDER — MIDAZOLAM HCL 2 MG/2ML IJ SOLN
INTRAMUSCULAR | Status: AC
  Filled 2015-04-24: qty 2

## 2015-04-24 MED ORDER — LACTATED RINGERS IV SOLN
INTRAVENOUS | Status: DC
  Administered 2015-04-24: 19:00:00 via INTRAVENOUS

## 2015-04-24 MED ORDER — HYDROMORPHONE HCL 1 MG/ML IJ SOLN
1.0000 mg | INTRAMUSCULAR | Status: DC | PRN
  Administered 2015-04-24: 1 mg via INTRAVENOUS
  Filled 2015-04-24: qty 1

## 2015-04-24 MED ORDER — SODIUM BICARBONATE 8.4 % IV SOLN
INTRAVENOUS | Status: DC | PRN
  Administered 2015-04-24 (×2): 5 mL via EPIDURAL

## 2015-04-24 MED ORDER — FENTANYL CITRATE (PF) 100 MCG/2ML IJ SOLN
INTRAMUSCULAR | Status: DC | PRN
  Administered 2015-04-24: 50 ug via INTRAVENOUS

## 2015-04-24 MED ORDER — BUPIVACAINE HCL (PF) 0.25 % IJ SOLN
INTRAMUSCULAR | Status: AC
  Filled 2015-04-24: qty 30

## 2015-04-24 MED ORDER — MIDAZOLAM HCL 5 MG/5ML IJ SOLN
INTRAMUSCULAR | Status: DC | PRN
  Administered 2015-04-24: 1 mg via INTRAVENOUS

## 2015-04-24 SURGICAL SUPPLY — 22 items
BLADE SURG 11 STRL SS (BLADE) ×3 IMPLANT
CHLORAPREP W/TINT 26ML (MISCELLANEOUS) ×3 IMPLANT
CLIP FILSHIE TUBAL LIGA STRL (Clip) ×3 IMPLANT
CLOTH BEACON ORANGE TIMEOUT ST (SAFETY) ×3 IMPLANT
DRSG OPSITE POSTOP 3X4 (GAUZE/BANDAGES/DRESSINGS) ×3 IMPLANT
GLOVE BIO SURGEON STRL SZ 6.5 (GLOVE) ×2 IMPLANT
GLOVE BIO SURGEONS STRL SZ 6.5 (GLOVE) ×1
GLOVE BIOGEL PI IND STRL 7.0 (GLOVE) ×2 IMPLANT
GLOVE BIOGEL PI INDICATOR 7.0 (GLOVE) ×4
GOWN STRL REUS W/TWL LRG LVL3 (GOWN DISPOSABLE) ×6 IMPLANT
LIQUID BAND (GAUZE/BANDAGES/DRESSINGS) ×3 IMPLANT
NEEDLE HYPO 22GX1.5 SAFETY (NEEDLE) ×3 IMPLANT
NS IRRIG 1000ML POUR BTL (IV SOLUTION) ×3 IMPLANT
PACK ABDOMINAL MINOR (CUSTOM PROCEDURE TRAY) ×3 IMPLANT
SPONGE LAP 4X18 X RAY DECT (DISPOSABLE) IMPLANT
SUT VIC AB 0 CT1 27 (SUTURE) ×2
SUT VIC AB 0 CT1 27XBRD ANBCTR (SUTURE) ×1 IMPLANT
SUT VICRYL 4-0 PS2 18IN ABS (SUTURE) ×3 IMPLANT
SYR CONTROL 10ML LL (SYRINGE) ×3 IMPLANT
TOWEL OR 17X24 6PK STRL BLUE (TOWEL DISPOSABLE) ×6 IMPLANT
TRAY FOLEY BAG SILVER LF 16FR (SET/KITS/TRAYS/PACK) ×3 IMPLANT
WATER STERILE IRR 1000ML POUR (IV SOLUTION) ×3 IMPLANT

## 2015-04-24 NOTE — Progress Notes (Signed)
Patient is postpartum and requests permanent sterilization and tubal ligation is scheduled. The procedure and the risk of anesthesia, bleeding, infection, bowel and bladder injury, failure (1/200) and ectopic pregnancy were discussed and her questions were answered. H&P 11/1 reviewed, no change except postpartum status.  Adam PhenixJames G Arnold, MD 04/24/2015 2:18 PM

## 2015-04-24 NOTE — Anesthesia Preprocedure Evaluation (Signed)
Anesthesia Evaluation  Patient identified by MRN, date of birth, ID band Patient awake    Reviewed: Allergy & Precautions, NPO status , Patient's Chart, lab work & pertinent test results  History of Anesthesia Complications Negative for: history of anesthetic complications  Airway Mallampati: II  TM Distance: >3 FB Neck ROM: Full    Dental no notable dental hx. (+) Dental Advisory Given   Pulmonary neg pulmonary ROS,    Pulmonary exam normal breath sounds clear to auscultation       Cardiovascular negative cardio ROS Normal cardiovascular exam Rhythm:Regular Rate:Normal     Neuro/Psych negative neurological ROS  negative psych ROS   GI/Hepatic negative GI ROS, Neg liver ROS,   Endo/Other  negative endocrine ROS  Renal/GU negative Renal ROS  negative genitourinary   Musculoskeletal negative musculoskeletal ROS (+)   Abdominal   Peds negative pediatric ROS (+)  Hematology negative hematology ROS (+)   Anesthesia Other Findings   Reproductive/Obstetrics negative OB ROS Post partum                             Anesthesia Physical Anesthesia Plan  ASA: II  Anesthesia Plan: Epidural   Post-op Pain Management:    Induction:   Airway Management Planned:   Additional Equipment:   Intra-op Plan:   Post-operative Plan:   Informed Consent: I have reviewed the patients History and Physical, chart, labs and discussed the procedure including the risks, benefits and alternatives for the proposed anesthesia with the patient or authorized representative who has indicated his/her understanding and acceptance.   Dental advisory given  Plan Discussed with: CRNA  Anesthesia Plan Comments:         Anesthesia Quick Evaluation

## 2015-04-24 NOTE — Progress Notes (Addendum)
Follow up visit with patient chaplain has previously been working with on the Antenatal unit.  Pt stated that she feels good about baby Casen and is hopeful that he won't have to be in the hospital too long.  Pt had been feeling overwhelmed thinking she was burdening her mother and being away from her children, but reports she is doing well now.  Pt is aware of how to contact chaplain and also aware tha twe will continue to support her throughout Casen's stay in the NICU. Please page as further needs arise.  Maryanna ShapeAmanda M. Carley Hammedavee Lomax, M.Div. [facility] Chaplain Pager [phone] Office [phone]

## 2015-04-24 NOTE — Anesthesia Procedure Notes (Signed)
Spinal Patient location during procedure: OR Staffing Anesthesiologist: Renne Platts Performed by: anesthesiologist  Preanesthetic Checklist Completed: patient identified, site marked, surgical consent, pre-op evaluation, timeout performed, IV checked, risks and benefits discussed and monitors and equipment checked Spinal Block Patient position: sitting Prep: DuraPrep Patient monitoring: continuous pulse ox, blood pressure and heart rate Approach: midline Location: L3-4 Injection technique: single-shot Needle Needle type: Sprotte  Needle gauge: 24 G Needle length: 9 cm Additional Notes Functioning IV was confirmed and monitors were applied. Sterile prep and drape, including hand hygiene, mask and sterile gloves were used. The patient was positioned and the spine was prepped. The skin was anesthetized with lidocaine.  Free flow of clear CSF was obtained prior to injecting local anesthetic into the CSF.  The spinal needle aspirated freely following injection.  The needle was carefully withdrawn.  The patient tolerated the procedure well. Consent was obtained prior to procedure with all questions answered and concerns addressed. Risks including but not limited to bleeding, infection, nerve damage, paralysis, failed block, inadequate analgesia, allergic reaction, high spinal, itching and headache were discussed and the patient wished to proceed.   Samad Thon, MD     

## 2015-04-24 NOTE — Anesthesia Postprocedure Evaluation (Signed)
  Anesthesia Post-op Note  Patient: Carrie Bean  Procedure(s) Performed: * No procedures listed *  Patient Location: [facility]  Anesthesia Type:Epidural  Level of Consciousness: awake, alert , oriented and patient cooperative  Airway and Oxygen Therapy: Patient Spontanous Breathing  Post-op Pain: none  Post-op Assessment: Post-op Vital signs reviewed, Patient's Cardiovascular Status Stable, Respiratory Function Stable, Patent Airway, No headache, No backache and Patient able to bend at knees              Post-op Vital Signs: Reviewed and stable  Last Vitals:  Filed Vitals:   04/24/15 1200  BP: 90/50  Pulse: 89  Temp: 36.6 C  Resp: 18    Complications: No apparent anesthesia complications

## 2015-04-24 NOTE — Lactation Note (Signed)
This note was copied from the chart of Carrie Bean. Lactation Consultation Note  Patient Name: Carrie Bean ZOXWR'UToday's Date: 04/24/2015 Reason for consult: Initial assessment NICU baby 8024 hours old. Attempted to visit with mom early in the morning, but parents sleeping. Returned later in the day, but mom having bilateral tubal procedure. Mom has pump in room, and FOB states that mom pumped earlier but was not "getting anything." Discussed that this is normal. Mom nursed and pumped her 4 older children, so mom has experience. Enc patient's RN to assist mom with pumping when she returns to room.   Maternal Data    Feeding Feeding Type: Formula Nipple Type: Slow - flow Length of feed: 15 min  LATCH Score/Interventions                      Lactation Tools Discussed/Used Pump Review: Setup, frequency, and cleaning;Milk Storage Initiated by:: Bedside RN Date initiated:: 04/23/15   Consult Status Consult Status: Follow-up Date: 04/25/15 Follow-up type: In-patient    [name], [name] 04/24/2015, 4:11 PM

## 2015-04-24 NOTE — Progress Notes (Signed)
CSW acknowledges NICU admission.    Patient screened out for psychosocial assessment since none of the following apply:  Psychosocial stressors documented in mother or baby's chart  Gestation less than 32 weeks  Code at delivery   Critically ill infant  Infant with anomalies  Please contact the Clinical Social Worker if specific needs arise, or by MOB's request.       

## 2015-04-24 NOTE — Progress Notes (Signed)
Post Partum Day 1 Subjective:  Carrie Bean is a 29 y.o. [id number] 1548w0d s/p SVD.  No acute events overnight.  Pt denies problems with ambulating, voiding or po intake.  She had nausea yesterday after delivery which has since resolved. She has not had vomiting. She has been NPO since midnight for BTL today. Pain is well controlled.  She has had flatus.  Lochia is minimal.  Plan for birth control is bilateral tubal ligation.  Method of feeding is breast  Objective: Blood pressure 92/53, pulse 91, temperature 98.1 F (36.7 C), temperature source Oral, resp. rate 18, height 5\' 10"  (1.778 m), weight 75.297 kg (166 lb), last menstrual period 08/28/2014, SpO2 96 %, unknown if currently breastfeeding.  Physical Exam:  General: alert, cooperative and no distress Lochia: normal flow Chest: normal WOB, clear to auscultation on frontal lung fields Heart: regular rate, S1S2 heard Abdomen: +BS, appropriately tender Uterine Fundus: firm, located 2in below level of umbilicus Extremities: no edema   Recent Labs  04/23/15 0700 04/24/15 0500  HGB 10.9* 9.4*  HCT 34.9* 29.6*    Assessment/Plan:  ASSESSMENT: Carrie BimlerSieddha D Limas is a 29 y.o. [id number] 1548w0d s/p SVD.  BTL today at 1500. Plan for discharge tomorrow Continue routine PP care Breastfeeding support PRN   LOS: 15 days   Gabriel RungKristen Tandra Rosado, MS3 04/24/2015, 8:23 AM

## 2015-04-24 NOTE — Op Note (Signed)
Carrie Bean 04/09/2015 - 04/24/2015  PREOPERATIVE DIAGNOSIS:  Multiparity, undesired fertility  POSTOPERATIVE DIAGNOSIS:  Multiparity, undesired fertility  PROCEDURE:  Postpartum Bilateral Tubal Sterilization using Filshie Clips   ANESTHESIA:  Epidural  COMPLICATIONS:  None immediate.  ESTIMATED BLOOD LOSS:  Less than 20 ml.  FLUIDS: 1000 ml LR.  URINE OUTPUT:  100 ml of clear urine.  INDICATIONS: 29 y.o. [id number]  with undesired fertility,status post vaginal delivery, desires permanent sterilization. Risks and benefits of procedure discussed with patient including permanence of method, bleeding, infection, injury to surrounding organs and need for additional procedures. Risk failure of 0.5-1% with increased risk of ectopic gestation if pregnancy occurs was also discussed with patient.   FINDINGS:  Normal uterus, tubes, and ovaries.  TECHNIQUE:  The patient was taken to the operating room and spinal anesthesia was induced and found to be adequate.  She was then placed in the dorsal supine position and prepped and draped in sterile fashion.  After an adequate timeout was performed, attention was turned to the patient's abdomen where a small transverse skin incision was made under the umbilical fold. The incision was taken down to the layer of fascia using the scalpel, and fascia was incised, and extended bilaterally using Mayo scissors. The peritoneum was entered in a sharp fashion. Attention was then turned to the patient's uterus, and left fallopian tube was identified and followed out to the fimbriated end.  A Filshie clip was placed on the left fallopian tube about 2 cm from the cornual attachment, with care given to incorporate the underlying mesosalpinx.  A similar process was carried out on the right side allowing for bilateral tubal sterilization.  Good hemostasis was noted overall.  Local analgesia was drizzled on both operative sites.The instruments were then removed from the  patient's abdomen and the fascial incision was repaired with 0 Vicryl, and the skin was closed with a 4-0 Vicryl subcuticular stitch. The patient tolerated the procedure well.  Sponge, lap, and needle counts were correct times two.  The patient was then taken to the recovery room awake and in stable condition.  Scheryl DarterJames Arnold MD 04/24/2015 3:08 PM

## 2015-04-24 NOTE — Transfer of Care (Signed)
Immediate Anesthesia Transfer of Care Note  Patient: Carrie Bean  Procedure(s) Performed: Procedure(s): POST PARTUM TUBAL LIGATION WITH FILSHIE CLIPS (Bilateral)  Patient Location: PACU  Anesthesia Type:Spinal  Level of Consciousness: awake, alert  and oriented  Airway & Oxygen Therapy: Patient Spontanous Breathing  Post-op Assessment: Report given to RN and Post -op Vital signs reviewed and stable  Post vital signs: Reviewed and stable  Last Vitals:  Filed Vitals:   04/24/15 1200  BP: 90/50  Pulse: 89  Temp: 36.6 C  Resp: 18    Complications: No apparent anesthesia complications

## 2015-04-24 NOTE — Anesthesia Postprocedure Evaluation (Signed)
  Anesthesia Post-op Note  Patient: Carrie Bean  Procedure(s) Performed: Procedure(s) (LRB): POST PARTUM TUBAL LIGATION WITH FILSHIE CLIPS (Bilateral)  Patient Location: PACU  Anesthesia Type: Spinal  Level of Consciousness: awake and alert   Airway and Oxygen Therapy: Patient Spontanous Breathing  Post-op Pain: mild  Post-op Assessment: Post-op Vital signs reviewed, Patient's Cardiovascular Status Stable, Respiratory Function Stable, Patent Airway and No signs of Nausea or vomiting  Last Vitals:  Filed Vitals:   04/24/15 1715  BP: 109/70  Pulse: 88  Temp:   Resp: 15    Post-op Vital Signs: stable   Complications: No apparent anesthesia complications

## 2015-04-25 ENCOUNTER — Telehealth: Payer: Self-pay | Admitting: General Practice

## 2015-04-25 ENCOUNTER — Encounter: Payer: Medicaid Other | Admitting: Obstetrics & Gynecology

## 2015-04-25 ENCOUNTER — Encounter (HOSPITAL_COMMUNITY): Payer: Self-pay | Admitting: Obstetrics & Gynecology

## 2015-04-25 MED ORDER — HYDROCODONE-ACETAMINOPHEN 5-325 MG PO TABS: 1.0000 | ORAL_TABLET | Freq: Four times a day (QID) | ORAL | Status: DC | PRN

## 2015-04-25 MED ORDER — PRENATAL MULTIVITAMIN CH: 1.0000 | ORAL_TABLET | Freq: Every day | ORAL | Status: DC

## 2015-04-25 MED ORDER — IBUPROFEN 600 MG PO TABS: 600.0000 mg | ORAL_TABLET | Freq: Four times a day (QID) | ORAL | Status: DC | PRN

## 2015-04-25 NOTE — Progress Notes (Signed)
Pt is discharged in the care of Mother. Downstairs per ambulatory. Stable. Denies any pain or discomfort.Infant to remain in [place]. Discharged instructions with Rx were given tp pt with good understanding. Spirits are good. No equipment needed for home use.

## 2015-04-25 NOTE — Progress Notes (Addendum)
Patient refused to ambulate, has been sleeping off and on during the evening. Awaken to ambulate and empty bladder, crying in pain.  Encouraged patient to ambulate, keep bladder empty, to turn on sides and not to lay in position constantly.  Incentive spirometer initiated.  Encouraged patient she needed to pump breast q3h since baby is in NICU. She has not pump since earlier on Tuesday am before surgery.

## 2015-04-25 NOTE — Telephone Encounter (Signed)
Patient's mother called and left message stating her HR has not received her FMLA papers yet. Patient's mother left name of Neysa BonitoChristy to fax to @ [phone]

## 2015-04-25 NOTE — Discharge Summary (Signed)
Obstetric Discharge Summary Reason for Admission: rupture of membranes and abruption 04/09/15 Expand All Collapse All     Carrie Bean is a 29 y.o. female presenting for known abruption with bleeding (recurrent) She is now 6331w0d She was hospitalized 10/29-30 for repeat abruption, left AMA due to child care issues. Bleeding had stopped while in hospital. This morning new bleeding, like a heavy period. Also with some abdominal pain. Contractions: Irritability. Vag. Bleeding: Small. Movement: Present. Denies leaking of fluid. So she returns tonight for readmission and monitoring/observation. SIUP at 2631w0d  Placental Abruption  Bleeding in the 3rd trimester      Induction of labor done at 34.0 wk. After PPROM at 32 + wk.while in hospital     Prenatal Procedures: betamethasone Intrapartum Procedures: spontaneous vaginal delivery 04/23/15  Delivery Note At 3:34 PM a healthy female was delivered via Vaginal, Spontaneous Delivery (Presentation: ROA). APGAR: 9, 9; weight 4 lb 4.8 oz (1949 g).  Placenta status: retained, see note below. Cord: 3-vessel, manually extracted.  Anesthesia: Epidural  Episiotomy: None Lacerations: None Suture Repair: N/A Est. Blood Loss (mL): 250  Mom to postpartum. Baby to NICU.  Carrie Bean is a 29 y.o. female [id number] 3127G5P3205 with IUP at 275w0d admitted for chronic abruption then PPROM while inpatient. Labor was induced at 34 weeks for PPROM. She progressed to complete and pushed ~10 minutes to deliver. Infant initially with slow transition so cord clamped by CNM, cut by FOB and infant taken to warmer to NICU team who were present for delivery. Placenta delayed by more than 30 minutes. Delivered small piece/lobe of placenta disconnected from rest of placenta, so difficult to apply cord traction. CNM attempt at manual extraction unsuccessful so Dr Carrie Bean called to room. Placenta manually extracted in room, then curette used to remove placental  fragments. Pt tolerated well with epidural. Perineum intact with minimal bleeding. Infant to NICU r/t gestational age. Mom stable prior to transfer to postpartum. She plans on breastfeeding. She requests BTL for birth control.  Carrie Bean 04/23/2015, 4:19 PM  Postpartum Procedures:Called to room to remove placenta. In a sterile fashion, I removed the placenta which was calcified and adherent to the anterior, right wall. The endometrium still felt rough. Two were attempts were made manually to remove the material. A sharp curette was then used to remove several small pieces. Uterus firmed with minimal blood loss. One dose of Zosyn given due to instrumentation.Carrie Bean.   Attestation of Attending Supervision of Advanced Practitioner (CNM/NP): Evaluation and management procedures were performed by the Advanced Practitioner under my supervision and collaboration. I have reviewed the Advanced Practitioner's note and chart, and I agree with the management and plan.  Carrie DukesLEGGETT,KELLY H., MD 1:35 PM    P.P. tubal ligation    Complications-Operative and Postpartum: retained placenta, see above HEMOGLOBIN  Date Value Ref Range Status  04/24/2015 9.4* 12.0 - 15.0 g/dL Final   HCT  Date Value Ref Range Status  04/24/2015 29.6* 36.0 - 46.0 % Final    Physical Exam:  General: alert, cooperative and mild distress low pain tolerances Lochia: appropriate Uterine Fundus: firm Incision: healing well DVT Evaluation: No evidence of DVT seen on physical exam.  Discharge Diagnoses: pregnancy 34.0wk  Discharge Information: Date: 04/25/2015 Activity: pelvic rest Diet: routine and vicodin, ibuprofen Medications: Ibuprofen and Vicodin Condition: stable Instructions: refer to practice specific booklet Discharge to: home Follow-up Information    [facility]   Specialty:  Obstetrics and Gynecology   Why:  postpartum visit   Contact information:   [address] [id number] mc [place] [address] [phone]      Newborn Data: Live born female  Birth Weight: 4 lb 4.8 oz (1949 g) APGAR: 9, 9  Home with still in NICU.  Carrie Bean 04/25/2015, 7:30 AM

## 2015-04-26 NOTE — Telephone Encounter (Signed)
Called patient's mother and left message stating we are trying to return your phone call and wanted to let you know we have received your message and someone is working on that paperwork for you. If you have other questions you may call us back at the clinics

## 2015-05-09 ENCOUNTER — Encounter: Payer: Medicaid Other | Admitting: Family Medicine

## 2015-05-16 ENCOUNTER — Encounter: Payer: Medicaid Other | Admitting: Obstetrics & Gynecology

## 2015-05-23 ENCOUNTER — Encounter: Payer: Medicaid Other | Admitting: Family

## 2015-05-29 ENCOUNTER — Ambulatory Visit: Payer: Self-pay | Admitting: Advanced Practice Midwife

## 2015-05-29 ENCOUNTER — Ambulatory Visit (INDEPENDENT_AMBULATORY_CARE_PROVIDER_SITE_OTHER): Payer: Medicaid Other | Admitting: Advanced Practice Midwife

## 2015-05-29 VITALS — BP 105/66 | HR 87 | Temp 98.4°F | Resp 20 | Ht 70.0 in | Wt 142.6 lb

## 2015-05-29 DIAGNOSIS — O9229 Other disorders of breast associated with pregnancy and the puerperium: Secondary | ICD-10-CM

## 2015-05-29 DIAGNOSIS — R300 Dysuria: Secondary | ICD-10-CM

## 2015-05-29 LAB — POCT URINALYSIS DIP (DEVICE)
GLUCOSE, UA: NEGATIVE mg/dL
Ketones, ur: NEGATIVE mg/dL
NITRITE: NEGATIVE
Protein, ur: 100 mg/dL — AB
UROBILINOGEN UA: 2 mg/dL — AB (ref 0.0–1.0)
pH: 6 (ref 5.0–8.0)

## 2015-05-29 NOTE — Progress Notes (Signed)
Subjective:     Carrie Bean is a 29 y.o. female who presents for a postpartum visit. She is 5 weeks postpartum following a vaginal delivery. I have fully reviewed the prenatal and intrapartum course. The delivery was at 34 gestational weeks. Outcome: infant went to NICU x1 week. Anesthesia: epidural. Postpartum course has been complicated by breastfeeding issues. She also had urinary retention and had foley catheter x10 days - [facility]  Baby's course has been uncomplicated. Baby is feeding by breast and bottle. Bleeding normal - started period on 12/19. Bowel function is normal. Bladder function - pt is having soreness with urination. Patient is not sexually active. Contraception method is BTS done after delivery. Postpartum depression screening: 5  The following portions of the patient's history were reviewed and updated as appropriate: allergies, current medications, past family history, past medical history, past social history, past surgical history and problem list.  Review of Systems Pertinent items noted in HPI and remainder of comprehensive ROS otherwise negative.   Objective:    BP 105/66 mmHg  Pulse 87  Temp(Src) 98.4 F (36.9 C) (Oral)  Resp 20  Ht 5\' 10"  (1.778 m)  Wt 142 lb 9.6 oz (64.683 kg)  BMI 20.46 kg/m2  LMP 05/27/2015  Breastfeeding? Yes  General:  alert, cooperative and no distress   Breasts:    Lungs: clear to auscultation bilaterally  Heart:  regular rate and rhythm, S1, S2 normal, no murmur, click, rub or gallop  Abdomen: soft, non-tender; bowel sounds normal; no masses,  no organomegaly        Assessment:     1. Dysuria --Likely irritation from catheter that was placed postpartum for >1 week - Culture, OB Urine --Return if symptoms worsen/persist  2. Postpartum examination following vaginal delivery   3. Sore nipples due to lactation --Discussed safe treatments including polysporin and hydrocortisone cream, pt given contact  information for outpatient lactation appointments, encouraged to see LC to evaluate infant latch.     Plan:    1. Contraception: tubal ligation  2.  Urine culture pending r/t dysuria 3. Pt to follow up with lactation services to improve breastfeeding/decrease pain 4. Follow up in: 1 year or as needed.

## 2015-05-29 NOTE — Progress Notes (Signed)
Patient ID: Carrie BimlerSieddha D Schlag, female   DOB: 04/03/1986, 29 y.o.   MRN: [id number] Pt is having problems with breastfeeding and feeling frustrated often.

## 2015-05-29 NOTE — Patient Instructions (Signed)
Recommend Polysporin ointment and hydrocortisone cream for sore/cracked nipples.   Outpatient Breastfeeding Consultation After being discharged from the hospital, if you require additional assistance with breastfeeding, contact a Lactation Consultant at [phone]. We will set up an outpatient appointment at [facility] for you, your baby, and if you like, your support person. We will file your insurance.   Breastfeeding Challenges and Solutions Even though breastfeeding is natural, it can be challenging, especially in the first few weeks after childbirth. It is normal for problems to arise when starting to breastfeed your new baby, even if you have breastfed before. This document provides some solutions to the most common breastfeeding challenges.  CHALLENGES AND SOLUTIONS Challenge--Cracked or Sore Nipples Cracked or sore nipples are commonly experienced by breastfeeding mothers. Cracked or sore nipples often are caused by inadequate latching (when your baby's mouth attaches to your breast to breastfeed). Soreness can also happen if your baby is not positioned properly at your breast. Although nipple cracking and soreness are common during the first week after birth, nipple pain is never normal. If you experience nipple cracking or soreness that lasts longer than 1 week or nipple pain, call your health care provider or lactation consultant.  Solution Ensure proper latching and positioning of your baby by following the steps below:  Find a comfortable place to sit or lie down, with your neck and back well supported.  Place a pillow or rolled up blanket under your baby to bring him or her to the level of your breast (if you are seated).  Make sure that your baby's abdomen is facing your abdomen.  Gently massage your breast. With your fingertips, massage from your chest wall toward your nipple in a circular motion. This encourages milk flow. You may need to continue this action during the  feeding if your milk flows slowly.  Support your breast with 4 fingers underneath and your thumb above your nipple. Make sure your fingers are well away from your nipple and your baby's mouth.  Stroke your baby's lips gently with your finger or nipple.  When your baby's mouth is open wide enough, quickly bring your baby to your breast, placing your entire nipple and as much of the colored area around your nipple (areola) as possible into your baby's mouth.  More areola should be visible above your baby's upper lip than below the lower lip.  Your baby's tongue should be between his or her lower gum and your breast.  Ensure that your baby's mouth is correctly positioned around your nipple (latched). Your baby's lips should create a seal on your breast and be turned out (everted).  It is common for your baby to suck for about 2-3 minutes in order to start the flow of breast milk. Signs that your baby has successfully latched on to your nipple include:   Quietly tugging or quietly sucking without causing you pain.   Swallowing heard between every 3-4 sucks.   Muscle movement above and in front of his or her ears with sucking.  Signs that your baby has not successfully latched on to nipple include:   Sucking sounds or smacking sounds from your baby while nursing.   Nipple pain.  Ensure that your breasts stay moisturized and healthy by:  Avoiding the use of soap on your nipples.   Wearing a supportive bra. Avoid wearing underwire-style bras or tight bras.   Air drying your nipples for 3-4 minutes after each feeding.   Using only cotton bra pads  to absorb breast milk leakage. Leaking of breast milk between feedings is normal. Be sure to change the pads if they become soaked with milk.  Using lanolin on your nipples after nursing. Lanolin helps to maintain your skin's normal moisture barrier. If you use pure lanolin you do not need to wash it off before feeding your baby again.  Pure lanolin is not toxic to your baby. You may also hand express a few drops of breast milk and gently massage that milk into your nipples, allowing it to air dry. Challenge--Breast Engorgement Breast engorgement is the overfilling of your breasts with breast milk. In the first few weeks after giving birth, you may experience breast engorgement. Breast engorgement can make your breasts throb and feel hard, tightly stretched, warm, and tender. Engorgement peaks about the fifth day after you give birth. Having breast engorgement does not mean you have to stop breastfeeding your baby. Solution  Breastfeed when you feel the need to reduce the fullness of your breasts or when your baby shows signs of hunger. This is called "breastfeeding on demand."  Newborns (babies younger than 4 weeks) often breastfeed every 1-3 hours during the day. You may need to awaken your baby to feed if he or she is asleep at a feeding time.  Do not allow your baby to sleep longer than 5 hours during the night without a feeding.  Pump or hand express breast milk before breastfeeding to soften your breast, areola, and nipple.  Apply warm, moist heat (in the shower or with warm water-soaked hand towels) just before feeding or pumping, or massage your breast before or during breastfeeding. This increases circulation and helps your milk to flow.  Completely empty your breasts when breastfeeding or pumping. Afterward, wear a snug bra (nursing or regular) or tank top for 1-2 days to signal your body to slightly decrease milk production. Only wear snug bras or tank tops to treat engorgement. Tight bras typically should be avoided by breastfeeding mothers. Once engorgement is relieved, return to wearing regular, loose-fitting clothes.  Apply ice packs to your breasts to lessen the pain from engorgement and relieve swelling, unless the ice is uncomfortable for you.  Do not delay feedings. Try to relax when it is time to feed your  baby. This helps to trigger your "let-down reflex," which releases milk from your breast.  Ensure your baby is latched on to your breast and positioned properly while breastfeeding.  Allow your baby to remain at your breast as long as he or she is latched on well and actively sucking. Your baby will let you know when he or she is done breastfeeding by pulling away from your breast or falling asleep.  Avoid introducing bottles or pacifiers to your baby in the early weeks of breastfeeding. Wait to introduce these things until after resolving any breastfeeding challenges.  Try to pump your milk on the same schedule as when your baby would breastfeed if you are returning to work or away from home for an extended period.  Drink plenty of fluids to avoid dehydration, which can eventually put you at greater risk of breast engorgement. If you follow these suggestions, your engorgement should improve in 24-48 hours. If you are still experiencing difficulty, call your lactation consultant or health care provider.  Challenge--Plugged Milk Ducts Plugged milk ducts occur when the duct does not drain milk effectively and becomes swollen. Wearing a tight-fitting nursing bra or having difficulty with latching may cause plugged milk ducts. Not drinking  enough water (8-10 c [1.9-2.4 L] per day) can contribute to plugged milk ducts. Once a duct has become plugged, hard lumps, soreness, and redness may develop in your breast.  Solution Do not delay feedings. Feed your baby frequently and try to empty your breasts of milk at each feeding. Try breastfeeding from the affected side first so there is a better chance that the milk will drain completely from that breast. Apply warm, moist towels to your breasts for 5-10 minutes before feeding. Alternatively, a hot shower right before breastfeeding can provide the moist heat that can encourage milk flow. Gentle massage of the sore area before and during a feeding may also help.  Avoid wearing tight clothing or bras that put pressure on your breasts. Wear bras that offer good support to your breasts, but avoid underwire bras. If you have a plugged milk duct and develop a fever, you need to see your health care provider.  Challenge--Mastitis Mastitis is inflammation of your breast. It usually is caused by a bacterial infection and can cause flu-like symptoms. You may develop redness in your breast and a fever. Often when mastitis occurs, your breast becomes firm, warm, and very painful. The most common causes of mastitis are poor latching, ineffective sucking from your baby, consistent pressure on your breast (possibly from wearing a tight-fitting bra or shirt that restricts the milk flow), unusual stress or fatigue, or missed feedings.  Solution You will be given antibiotic medicine to treat the infection. It is still important to breastfeed frequently to empty your breasts. Continuing to breastfeed while you recover from mastitis will not harm your baby. Make sure your baby is positioned properly during every feeding. Apply moist heat to your breasts for a few minutes before feeding to help the milk flow and to help your breasts empty more easily. Challenge--Thrush Ginette Pitman is a yeast infection that can form on your nipples, in your breast, or in your baby's mouth. It causes itching, soreness, burning or stabbing pain, and sometimes a rash.  Solution You will be given a medicated ointment for your nipples, and your baby will be given a liquid medicine for his or her mouth. It is important that you and your baby are treated at the same time because thrush can be passed between you and your baby. Change disposable nursing pads often. Any bras, towels, or clothing that come in contact with infected areas of your body or your baby's body need to be washed in very hot water every day. Wash your hands and your baby's hands often. All pacifiers, bottle nipples, or toys your baby puts in his  or her mouth should be boiled once a day for 20 minutes. After 1 week of treatment, discard pacifiers and bottle nipples and buy new ones. All breast pump parts that touch the milk need to be boiled for 20 minutes every day. Challenge--Low Milk Supply You may not be producing enough milk if your baby is not gaining the proper amount of weight. Breast milk production is based on a supply-and-demand system. Your milk supply depends on how frequently and effectively your baby empties your breast. Solution The more you breastfeed and pump, the more breast milk you will produce. It is important that your baby empties at least one of your breasts at each feeding. If this is not happening, then use a breast pump or hand express any milk that remains. This will help to drain as much milk as possible at each feeding. It will also  signal your body to produce more milk. If your baby is not emptying your breasts, it may be due to latching, sucking, or positioning problems. If low milk supply continues after addressing these issues, contact your health care provider or a lactation specialist as soon as possible. Challenge--Inverted or Flat Nipples Some women have nipples that turn inward instead of protruding outward. Other women have nipples that are flat. Inverted or flat nipples can sometimes make it more difficult for your baby to latch onto your breast. Solution You may be given a small device that pulls out inverted nipples. This device should be applied right before your baby is brought to your breast. You can also try using a breast pump for a short time before placing the baby at your breast. The pump can pull your nipple outwards to help your infant latch more easily. The baby's sucking motion will help the inverted nipple protrude as well.  If you have flat nipples, encourage your baby to latch onto your breast and feed frequently in the early days after birth. This will give your baby practice latching on  correctly while your breast is still soft. When your milk supply increases, between the second and fifth day after birth and your breasts become full, your baby will have an easier time latching.  Contact a lactation consultant if you still have concerns. She or he can teach you additional techniques to address breastfeeding problems related to nipple shape and position.  FOR MORE INFORMATION [employer]: www.llli.org   This information is not intended to replace advice given to you by your health care provider. Make sure you discuss any questions you have with your health care provider.   Document Released: 11/16/2005 Document Revised: 06/15/2014 Document Reviewed: 11/18/2012 Elsevier Interactive Patient Education [employer]

## 2015-05-30 ENCOUNTER — Encounter: Payer: Medicaid Other | Admitting: Obstetrics & Gynecology

## 2015-05-30 ENCOUNTER — Encounter (HOSPITAL_COMMUNITY): Payer: Self-pay | Admitting: *Deleted

## 2015-05-30 LAB — CULTURE, OB URINE: Colony Count: 6000

## 2015-07-31 ENCOUNTER — Encounter: Payer: Self-pay | Admitting: *Deleted

## 2015-09-11 ENCOUNTER — Emergency Department (HOSPITAL_BASED_OUTPATIENT_CLINIC_OR_DEPARTMENT_OTHER)
Admission: EM | Admit: 2015-09-11 | Discharge: 2015-09-11 | Disposition: A | Discharge status: Home / Self Care | Payer: Medicaid Other | Attending: Emergency Medicine | Admitting: Emergency Medicine

## 2015-09-11 ENCOUNTER — Encounter (HOSPITAL_BASED_OUTPATIENT_CLINIC_OR_DEPARTMENT_OTHER): Payer: Self-pay

## 2015-09-11 DIAGNOSIS — Z87440 Personal history of urinary (tract) infections: Secondary | ICD-10-CM | POA: Insufficient documentation

## 2015-09-11 DIAGNOSIS — H16042 Marginal corneal ulcer, left eye: Secondary | ICD-10-CM | POA: Insufficient documentation

## 2015-09-11 DIAGNOSIS — Z8619 Personal history of other infectious and parasitic diseases: Secondary | ICD-10-CM | POA: Insufficient documentation

## 2015-09-11 DIAGNOSIS — H578 Other specified disorders of eye and adnexa: Secondary | ICD-10-CM | POA: Diagnosis present

## 2015-09-11 DIAGNOSIS — Z862 Personal history of diseases of the blood and blood-forming organs and certain disorders involving the immune mechanism: Secondary | ICD-10-CM | POA: Diagnosis not present

## 2015-09-11 DIAGNOSIS — H16002 Unspecified corneal ulcer, left eye: Secondary | ICD-10-CM

## 2015-09-11 DIAGNOSIS — Z79899 Other long term (current) drug therapy: Secondary | ICD-10-CM | POA: Insufficient documentation

## 2015-09-11 MED ORDER — POLYMYXIN B-TRIMETHOPRIM 10000-0.1 UNIT/ML-% OP SOLN: 1.0000 [drp] | OPHTHALMIC | Status: DC

## 2015-09-11 MED ORDER — ERYTHROMYCIN 5 MG/GM OP OINT: TOPICAL_OINTMENT | Freq: Four times a day (QID) | OPHTHALMIC | Status: DC

## 2015-09-11 MED ORDER — FLUORESCEIN SODIUM 1 MG OP STRP
1.0000 | ORAL_STRIP | Freq: Once | OPHTHALMIC | Status: AC
  Administered 2015-09-11: 1 via OPHTHALMIC
  Filled 2015-09-11: qty 1

## 2015-09-11 MED ORDER — TETRACAINE HCL 0.5 % OP SOLN
2.0000 [drp] | Freq: Once | OPHTHALMIC | Status: AC
  Administered 2015-09-11: 2 [drp] via OPHTHALMIC
  Filled 2015-09-11: qty 4

## 2015-09-11 NOTE — ED Notes (Signed)
C/o left d/c, redness, pain x 2 days-NAD-steady gait

## 2015-09-11 NOTE — ED Provider Notes (Signed)
CSN: [id number]     Arrival date & time 09/11/15  1619 History   First MD Initiated Contact with Patient 09/11/15 1629     Chief Complaint  Patient presents with  . Eye Drainage     (Consider location/radiation/quality/duration/timing/severity/associated sxs/prior Treatment) HPI Comments: 30 y.o. Female who wears glasses presents for left eye pain and redness x2 days.  The patient reports otherwise feeling well.  She does have some seasonal allergies.  She reports a sensation of something in her eye yesterday but not currently.  She says she feels a ball on her medial eye where the pain seems to be worse but that the whole eye is burning and painful.  She reports the vision in the eye is blurry.   Past Medical History  Diagnosis Date  . Preterm labor   . Urinary tract infection   . Gonorrhea   . Chlamydia   . Abnormal Pap smear     f/u was normal  . Anemia    Past Surgical History  Procedure Laterality Date  . No past surgeries    . Tubal ligation Bilateral 04/24/2015    Procedure: POST PARTUM TUBAL LIGATION WITH FILSHIE CLIPS;  Surgeon: Adam PhenixJames G Arnold, MD;  Location: [facility];  Service: Gynecology;  Laterality: Bilateral;   Family History  Problem Relation Age of Onset  . Other Neg Hx   . Hearing loss Neg Hx   . Hyperlipidemia Mother   . Asthma Daughter   . Asthma Son   . Hypertension Maternal Grandmother   . Stroke Maternal Grandmother    Social History  Substance Use Topics  . Smoking status: Never Smoker   . Smokeless tobacco: Never Used  . Alcohol Use: Yes     Comment: occ   OB History    Gravida Para Term Preterm AB TAB SAB Ectopic Multiple Living   5 5 3 2  0 0 0 0 0 5     Review of Systems  Constitutional: Negative for fever, chills and fatigue.  HENT: Positive for congestion. Negative for ear discharge, ear pain and postnasal drip.   Eyes: Positive for pain, discharge and redness.  Respiratory: Negative for cough and chest tightness.   Cardiovascular:  Negative for chest pain.  Gastrointestinal: Negative for abdominal pain.  Genitourinary: Negative for flank pain.  Musculoskeletal: Negative for back pain and arthralgias.  Skin: Negative for rash and wound.  Neurological: Negative for dizziness and weakness.  Hematological: Does not bruise/bleed easily.      Allergies  Review of patient's allergies indicates no known allergies.  Home Medications   Prior to Admission medications   Medication Sig Start Date End Date Taking? Authorizing Provider  HYDROcodone-acetaminophen (NORCO/VICODIN) 5-325 MG tablet Take 1 tablet by mouth every 6 (six) hours as needed for moderate pain. May take with ibuprofen 04/25/15   Tilda BurrowJohn Ferguson V, MD  ibuprofen (ADVIL,MOTRIN) 600 MG tablet Take 1 tablet (600 mg total) by mouth every 6 (six) hours as needed. 04/25/15   Tilda BurrowJohn Ferguson V, MD  Prenatal Vit-Fe Fumarate-FA (PRENATAL COMPLETE) 14-0.4 MG TABS Take 14 mg by mouth daily. Patient taking differently: Take 14 mg by mouth daily.  01/14/15   Reva Boresanya S Pratt, MD  Prenatal Vit-Fe Fumarate-FA (PRENATAL MULTIVITAMIN) TABS tablet Take 1 tablet by mouth daily at 12 noon. 04/25/15   Tilda BurrowJohn Ferguson V, MD   BP 115/89 mmHg  Pulse 94  Temp(Src) 98 F (36.7 C) (Oral)  Resp 18  Ht 5\' 10"  (1.778 m)  Wt  142 lb (64.411 kg)  BMI 20.37 kg/m2  SpO2 99%  LMP 09/03/2015 Physical Exam  Constitutional: She is oriented to person, place, and time. She appears well-developed and well-nourished. No distress.  HENT:  Head: Normocephalic and atraumatic.  Right Ear: External ear normal.  Left Ear: External ear normal.  Nose: Nose normal.  Mouth/Throat: Oropharynx is clear and moist. No oropharyngeal exudate.  Eyes: EOM are normal. Pupils are equal, round, and reactive to light. Right eye exhibits no chemosis, no discharge, no exudate and no hordeolum. No foreign body present in the right eye. Left eye exhibits discharge (clear). Left eye exhibits no chemosis. Right conjunctiva is  not injected. Right conjunctiva has no hemorrhage. Left conjunctiva is injected. Left conjunctiva has no hemorrhage. No scleral icterus.  Slit lamp exam:      The right eye shows no corneal abrasion.       The left eye shows corneal ulcer. The left eye shows no corneal abrasion, no corneal flare and no foreign body.    Neck: Normal range of motion. Neck supple.  Cardiovascular: Normal rate, regular rhythm, normal heart sounds and intact distal pulses.   No murmur heard. Pulmonary/Chest: Effort normal. No respiratory distress. She has no wheezes. She has no rales.  Abdominal: Soft. She exhibits no distension. There is no tenderness.  Musculoskeletal: Normal range of motion. She exhibits no edema or tenderness.  Neurological: She is alert and oriented to person, place, and time.  Skin: Skin is warm and dry. No rash noted. She is not diaphoretic.  Vitals reviewed.   ED Course  Procedures (including critical care time) Labs Review Labs Reviewed - No data to display  Imaging Review No results found. I have personally reviewed and evaluated these images and lab results as part of my medical decision-making.   EKG Interpretation None      MDM  Patient was seen and evaluated in stable condition.  Does not wear contacts, not diabetic.  Eye pain and blurry vision improved with proparicaine.  Ulceration seen on examination.  Discussed with Dr. Katrina Stack (ophtho) who recommended polytrim drops which were prescribed.  Patient to be seen in his office tomorrow.  Discussed plan of care with patient who expressed understanding and agreement.  Patient was discharged home in stable condition. Final diagnoses:  None    1. Corneal ulceration    Leta Baptist, MD 09/12/15 [phone]

## 2015-09-11 NOTE — Discharge Instructions (Signed)
You were seen and evaluated today for your eye redness. 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 This needs to be followed up closely with an ophthalmologist. He need to be seen within the next 24 hours. I spoke to the ophthalmologist on call. He can call his office first thing in the morning to arrange follow-up or just present to his office. Use the Polytrim drops as prescribed every 4 hours.  Corneal Ulcer A corneal ulcer is an open sore on the cornea. The cornea is the clear covering at the front and center of the eye.  CAUSES  Most corneal ulcers are caused by infection, but there are other causes as well.  Bacterial infection. A bacterial infection can occur and cause a corneal ulcer if:  Contact lenses are worn too long (especially overnight) or are not properly cared for.  An eye injury occurs, allowing bacteria to infect the area of injury.  Viral infection. A viral infection can occur and cause a corneal ulcer if:  The eye becomes infected with a virus, such as the herpes simplex (cold sore) virus, chickenpox virus, or shingles virus.  Fungal infection. A fungal infection can occur and cause a corneal ulcer if:  An eye injury resulted from contact with a plant or plant material.  An anti-inflammatory eye drop is overused.  You have a weakened immune system.  Contact lenses are improperly cared for or become infected.  Foreign bodies in the eye, such as sand, glass, or small pieces of glass or metal.  Dry eyes.  Certain disorders that prevent eyelids from closing completely, such as Bell's palsy.  Contact lenses, especially extended-wear soft contact lenses. Contact lenses can:  Scratch the cornea's surface, allowing bacteria to enter the scratch.  Trap dirt underneath the contact lens, which can scratch the cornea.  Harbor bacteria and fungi, making it more likely for bacterial infections to occur.  Block oxygen from the cornea, making it more likely for  infections to occur. SYMPTOMS   Eye pain that is often severe.  Blurry vision.  Light sensitivity.  Pus or thick discharge coming from your eye.  Eye redness.  Feeling like something is in your eye.  Watery or itchy eye.  Burning or stinging feeling. Some ulcers that are very big may be seen as a white spot on the cornea. DIAGNOSIS  An eye exam will be performed. Your health care provider may use a special kind of microscope (slit lamp) to look at the cornea. Eye drops may be put into the eye to make the ulcer easier to see. If it is suspected that an infection caused the corneal ulcer, tissue samples or cultures from the eye may be taken. Numbing eye drops will be given before any samples or cultures are taken. The samples or cultures will be examined in the lab to check for bacteria, viruses, or fungi. TREATMENT  Treatment of the corneal ulcer depends on the cause. If your ulcer is severe, you may be given antibiotic eye drops up until your health care provider knows the test results. Other treatments can include:  Antibacterial, antiviral, or antifungal eye drops or ointment.  Removing the foreign body that caused the eye injury.  Artificial tears or a bandage contact lens if severe dry eyes caused the corneal ulcer.  Over-the-counter or prescription pain medicine.  Steroidal eye drops if the eye is inflamed and swollen.  Antibiotic medicines by mouth.  An injection of medicine under the thin membrane covering the eyeball (  conjunctiva). This allows medicine to reach the ulcer in high doses.  Eye patching to reduce irritation from blinking and bright light. An eye patch may not be given if the ulcer was caused by a bacterial infection. If the corneal ulcer causes a scar on the cornea that interferes with vision, hospitalization and surgery may be needed to replace the cornea (corneal transplant). HOME CARE INSTRUCTIONS   If prescribed, use your antibiotic pills, eye drops,  or ointment as directed. Continue using them even if you start to feel better. You may have to apply eye drops as often as every few minutes to every hour, for days. It may be necessary to set your alarm clock every few minutes to every hour during the night. This is absolutely necessary.  Only take over-the-counter or prescription medicines as directed by your health care provider.  Apply artificial tears as needed if you have dry eyes.  Do not touch or rub your eye, because this may increase the irritation and spread the infection.  Avoid wearing eye makeup.  Stay in a dark room and use sunglasses if you have light sensitivity.  Apply cool packs to your eye to relieve discomfort and swelling.  If your eye is patched, you should not drive or use machinery. You will have reduced side vision and ability to judge distance.  Do not drive or operate machinery until approved by your health care provider. Your ability to judge distances is impaired.  Follow up with your health care provider as directed.  Do not wear contact lenses until your health care provider approves. If you normally wear contact lenses, follow these general rules to avoid the risk of a corneal ulcer:  Do not wear contact lenses while you sleep.  Wash your hands before removing contact lenses.  Properly sterilize and store your contact lenses.  Regularly clean your contact lens case.  Do not use your saliva or tap water to clean or wet your contact lenses.  Remove your contact lenses if your eye becomes irritated. You may put them back in once your eyes feel better. SEEK IMMEDIATE MEDICAL CARE IF:   You notice a change in your vision.  Your pain is getting worse, not better.  You have increasing discharge from the eye. MAKE SURE YOU:   Understand these instructions.  Will watch your condition.  Will get help right away if you are not doing well or get worse.   This information is not intended to replace  advice given to you by your health care provider. Make sure you discuss any questions you have with your health care provider.   Document Released: 07/02/2004 Document Revised: 06/15/2014 Document Reviewed: 10/25/2012 Elsevier Interactive Patient Education [employer]2016 [employer]

## 2015-09-11 NOTE — ED Notes (Signed)
MD at bedside. 

## 2016-06-19 ENCOUNTER — Emergency Department (HOSPITAL_BASED_OUTPATIENT_CLINIC_OR_DEPARTMENT_OTHER)
Admission: EM | Admit: 2016-06-19 | Discharge: 2016-06-20 | Disposition: A | Discharge status: Home / Self Care | Payer: Medicaid Other | Attending: Emergency Medicine | Admitting: Emergency Medicine

## 2016-06-19 ENCOUNTER — Encounter (HOSPITAL_BASED_OUTPATIENT_CLINIC_OR_DEPARTMENT_OTHER): Payer: Self-pay | Admitting: *Deleted

## 2016-06-19 DIAGNOSIS — J029 Acute pharyngitis, unspecified: Secondary | ICD-10-CM | POA: Insufficient documentation

## 2016-06-19 NOTE — ED Triage Notes (Signed)
Pt with sore throat x 2 days and cough x 18 hours. Denies fever

## 2016-06-20 LAB — RAPID STREP SCREEN (MED CTR MEBANE ONLY): Streptococcus, Group A Screen (Direct): NEGATIVE

## 2016-06-20 NOTE — Discharge Instructions (Signed)
Ibuprofen 600 mg every 6 hours as needed for pain.  Return to the ER if symptoms really worsen or change.

## 2016-06-20 NOTE — ED Provider Notes (Signed)
[facility]   CSN: [id number] Arrival date & time: 06/19/16  2340     History   Chief Complaint Chief Complaint  Patient presents with  . Sore Throat    HPI Carrie ZOOEY SCHREURS is a 31 y.o. female.  Patient is a 31 year old female with no significant past medical history. She presents for evaluation of sore throat. This is worsened over the past 2 days. She denies any fevers or chills. She does report some slight cough. She has 5 children at home, 3 of which are sick with upper respiratory symptoms.   The history is provided by the patient.  Sore Throat  This is a new problem. The current episode started 2 days ago. The problem occurs constantly. The problem has not changed since onset.Pertinent negatives include no chest pain and no shortness of breath. The symptoms are aggravated by swallowing. Nothing relieves the symptoms. She has tried nothing for the symptoms.    Past Medical History:  Diagnosis Date  . Abnormal Pap smear    f/u was normal  . Anemia   . Chlamydia   . Gonorrhea   . Preterm labor   . Urinary tract infection     There are no active problems to display for this patient.   Past Surgical History:  Procedure Laterality Date  . NO PAST SURGERIES    . TUBAL LIGATION Bilateral 04/24/2015   Procedure: POST PARTUM TUBAL LIGATION WITH FILSHIE CLIPS;  Surgeon: Adam Phenix, MD;  Location: [facility];  Service: Gynecology;  Laterality: Bilateral;    OB History    Gravida Para Term Preterm AB Living   5 5 3 2  0 5   SAB TAB Ectopic Multiple Live Births   0 0 0 0 5       Home Medications    Prior to Admission medications   Not on File    Family History Family History  Problem Relation Age of Onset  . Hyperlipidemia Mother   . Asthma Daughter   . Asthma Son   . Hypertension Maternal Grandmother   . Stroke Maternal Grandmother   . Other Neg Hx   . Hearing loss Neg Hx     Social History Social History  Substance Use  Topics  . Smoking status: Never Smoker  . Smokeless tobacco: Never Used  . Alcohol use No     Comment: occ     Allergies   Patient has no known allergies.   Review of Systems Review of Systems  Respiratory: Negative for shortness of breath.   Cardiovascular: Negative for chest pain.  All other systems reviewed and are negative.    Physical Exam Updated Vital Signs BP 122/72 (BP Location: Left Arm)   Pulse 81   Temp 98 F (36.7 C) (Oral)   Resp 16   Ht 5\' 11"  (1.803 m)   Wt 135 lb (61.2 kg)   LMP 06/14/2016 (Exact Date)   SpO2 100%   BMI 18.83 kg/m   Physical Exam  Constitutional: She is oriented to person, place, and time. She appears well-developed and well-nourished. No distress.  HENT:  Head: Normocephalic and atraumatic.  Mouth/Throat: Oropharynx is clear and moist. No oropharyngeal exudate.  Neck: Normal range of motion. Neck supple.  Cardiovascular: Normal rate and regular rhythm.  Exam reveals no gallop and no friction rub.   No murmur heard. Pulmonary/Chest: Effort normal and breath sounds normal. No respiratory distress. She has no wheezes.  Abdominal: Soft. Bowel sounds  are normal. She exhibits no distension. There is no tenderness.  Musculoskeletal: Normal range of motion.  Lymphadenopathy:    She has no cervical adenopathy.  Neurological: She is alert and oriented to person, place, and time.  Skin: Skin is warm and dry. She is not diaphoretic.  Nursing note and vitals reviewed.    ED Treatments / Results  Labs (all labs ordered are listed, but only abnormal results are displayed) Labs Reviewed  RAPID STREP SCREEN (NOT AT [facility])    EKG  EKG Interpretation None       Radiology No results found.  Procedures Procedures (including critical care time)  Medications Ordered in ED Medications - No data to display   Initial Impression / Assessment and Plan / ED Course  I have reviewed the triage vital signs and the nursing  notes.  Pertinent labs & imaging results that were available during my care of the patient were reviewed by me and considered in my medical decision making (see chart for details).  Clinical Course     Strep test is negative. Symptoms are most likely viral in nature. Will discharge, to return as needed for any problems.  Final Clinical Impressions(s) / ED Diagnoses   Final diagnoses:  None    New Prescriptions New Prescriptions   No medications on file     Geoffery Lyonsouglas Eren Ryser, MD 06/20/16 0310

## 2016-06-21 LAB — CULTURE, GROUP A STREP (THRC)

## 2017-01-24 IMAGING — US US OB FOLLOW-UP
1 series · 14 of 28 positions shown · non-contrast
Comparison: none

[Series 1: us ob follow-up · 56 acquisitions, 14 frames shown]
[im 3/56]
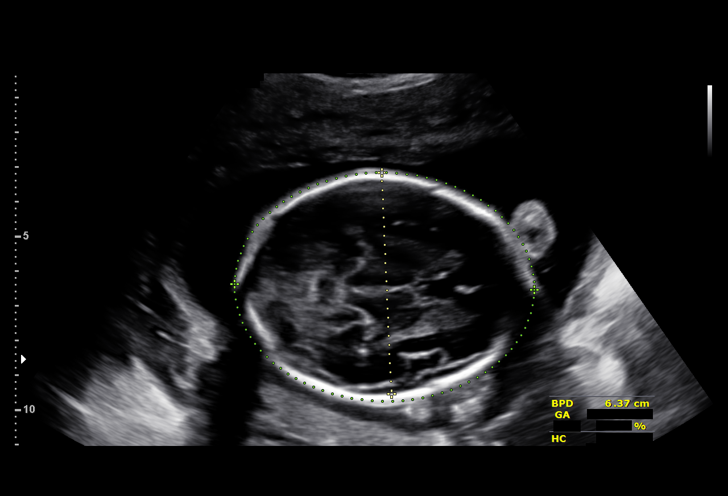
[im 7/56]
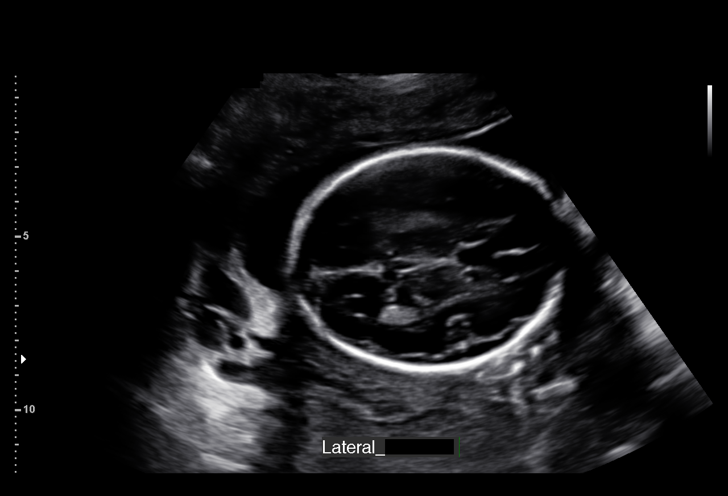
[im 11/56]
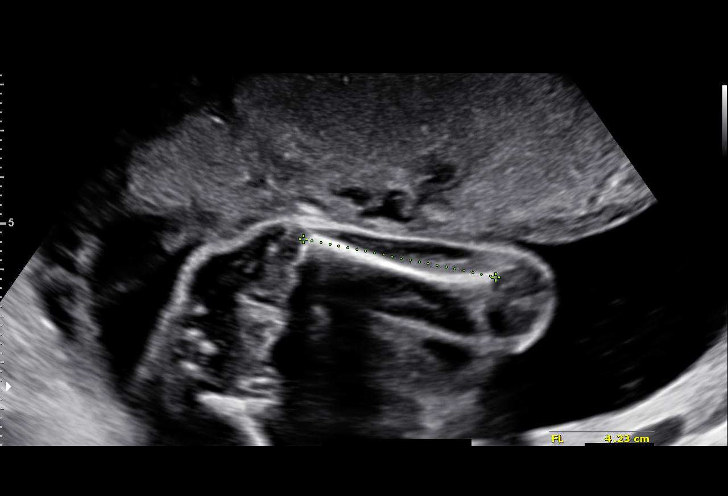
[im 15/56]
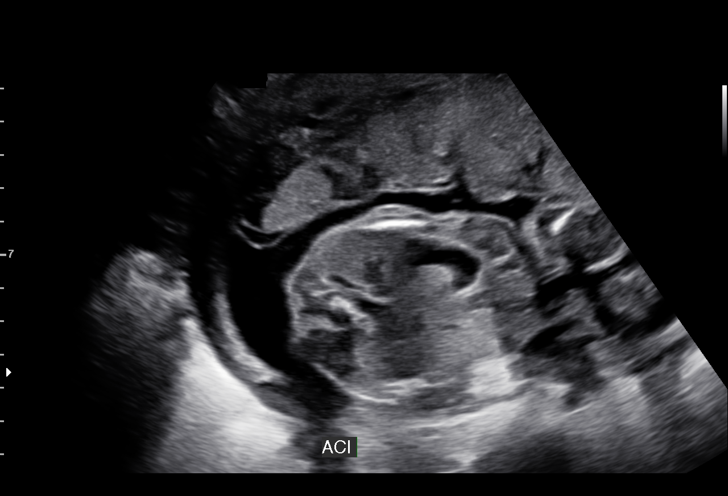
[im 19/56]
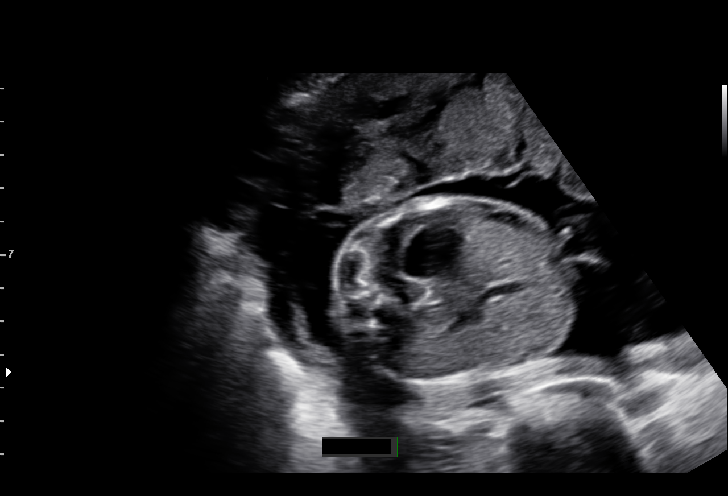
[im 23/56]
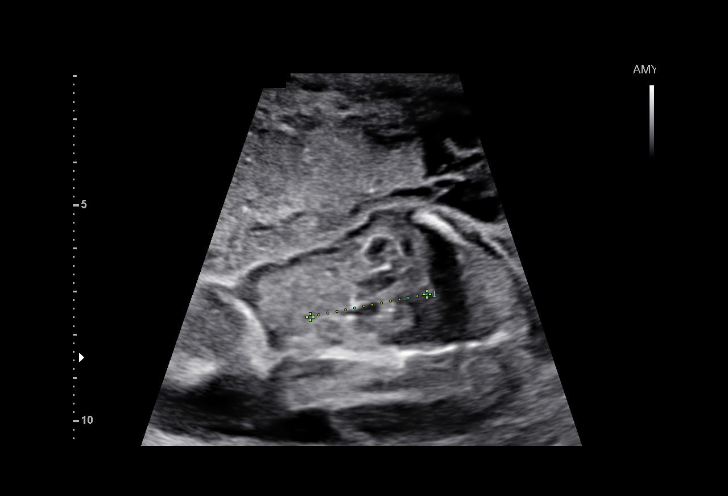
[im 27/56]
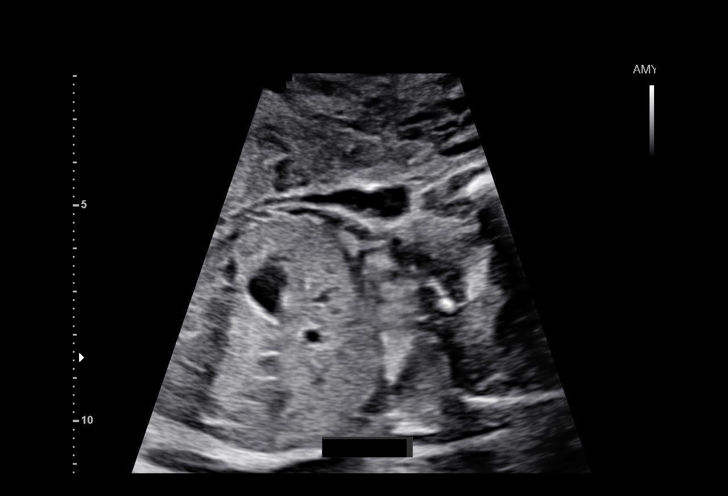
[im 31/56]
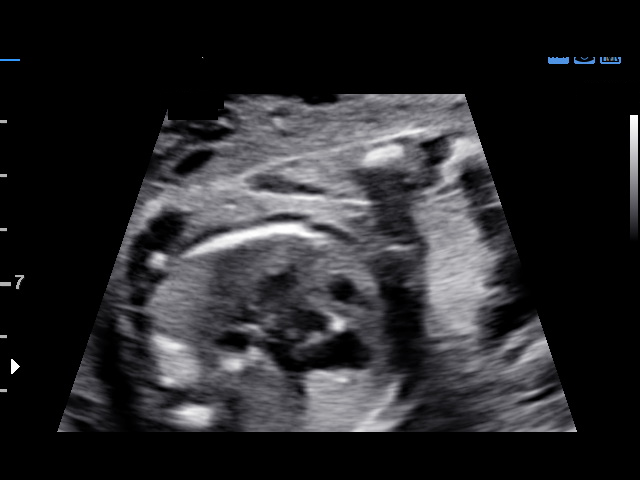
[im 35/56]
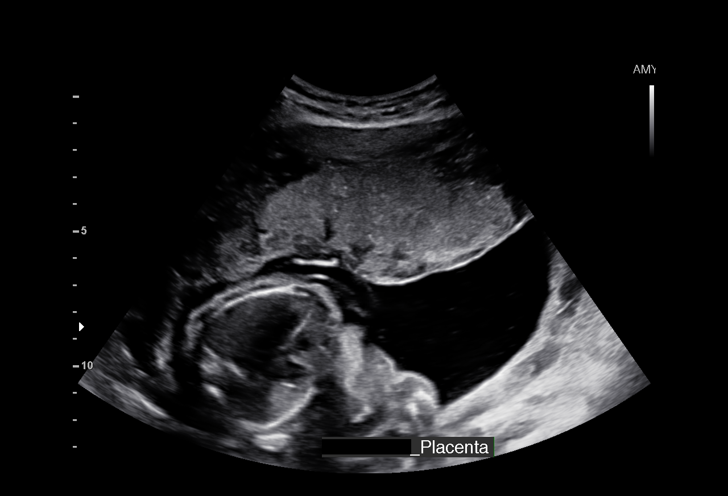
[im 39/56]
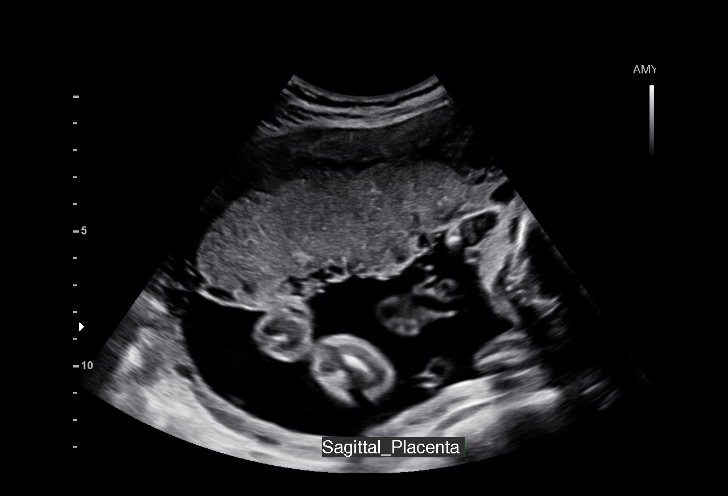
[im 43/56]
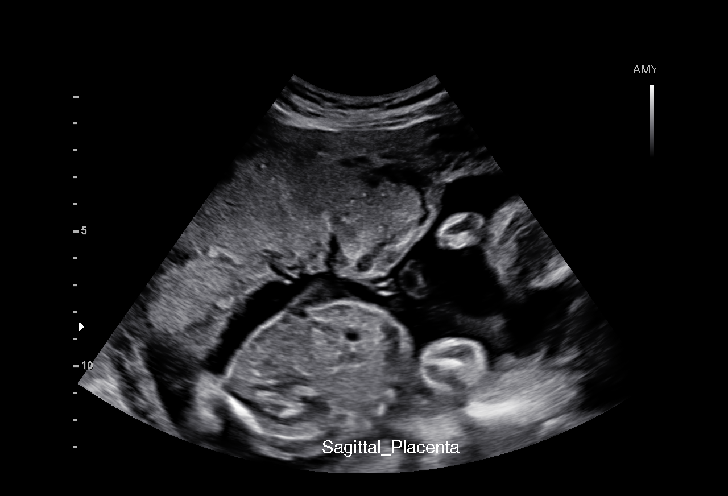
[im 47/56]
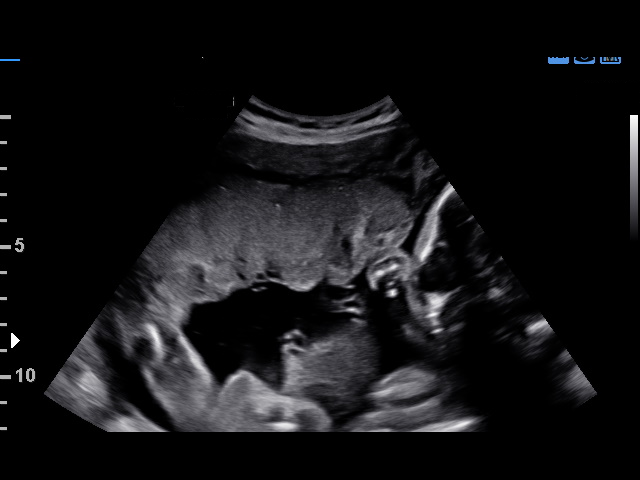
[im 51/56]
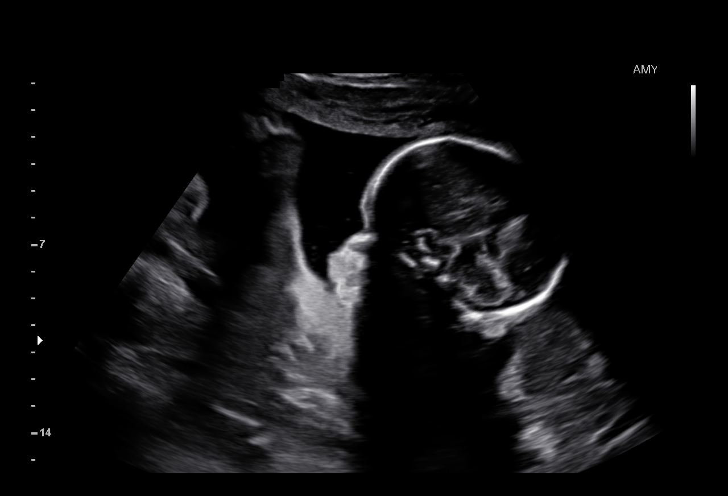
[im 56/56]
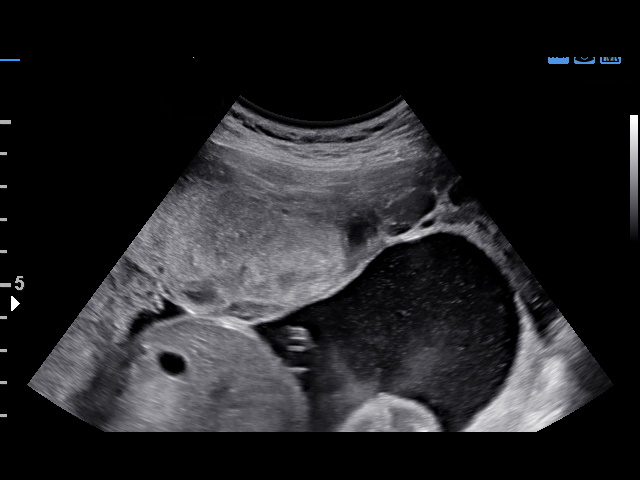

[14 of 28 positions shown; findings below may reference images not displayed]

OBSTETRICS REPORT
(Signed Final 02/25/2015 [DATE])

Date:

Service(s) Provided

US OB FOLLOW UP                                        76816.1
Indications

Poor obstetric history: Previous preterm delivery
36 [DATE] wks; 17P
Hemorrhage in early pregnancy, unspecified;
subchorionic x 2
Vaginal bleeding in pregnancy, second trimester
25 weeks gestation of pregnancy
Fetal Evaluation

Num Of             1
Fetuses:
Fetal Heart        130                          bpm
Rate:
Cardiac Activity:  Observed
Presentation:      Cephalic
Placenta:          Anterior, above cervical
os
P. Cord            Previously Visualized
Insertion:

Amniotic Fluid
AFI FV:      Subjectively within normal limits
Larg Pckt:      4.3  cm
Biometry

BPD:       64   m    G. Age:   25w 6d                 CI:         71.1   70 - 86
m
FL/HC:      17.6   18.6 -
20.4
HC:     241.8   m    G. Age:   26w 2d        40  %    HC/AC:      1.17   1.04 -
m
AC:     206.1   m    G. Age:   25w 1d        22  %    FL/BPD      66.4   71 - 87
m                                     :
FL:      42.5   m    G. Age:   23w 6d       < 3  %    FL/AC:      20.6   20 - 24
m
HUM:     40.3   m    G. Age:   24w 3d        12  %
m
Est.         741   gm   1 lb 10 oz      30   %
FW:
Gestational Age

LMP:           25w 6d        Date:  08/28/14                  EDD:   06/04/15
U/S Today:     25w 2d                                         EDD:   06/08/15
Best:          25w 6d    Det. By:   LMP  (08/28/14)           EDD:   06/04/15
Anatomy

Cranium:          Appears normal         Aortic Arch:       Previously seen
Fetal Cavum:      Appears normal         Ductal Arch:       Previously seen
Ventricles:       Appears normal         Diaphragm:         Appears normal
Choroid Plexus:   Previously seen        Stomach:           Appears normal,
left sided
Cerebellum:       Appears normal         Abdomen:           Previously seen
Posterior         Appears normal         Abdominal          Previously seen
Fossa:                                   Wall:
Nuchal Fold:      Previously seen        Cord Vessels:      Appears normal (3
vessel cord)
Face:             Orbits and profile     Kidneys:           Appear normal
previously seen
Lips:             Previously seen        Bladder:           Appears normal
Heart:            Appears normal         Spine:             Previously seen
(4CH, axis, and
situs)
RVOT:             Appears normal         Lower              Previously seen
Extremities:
LVOT:             Appears normal         Upper              Previously seen
Extremities:

Other:   Heels and 5th digit previously seen. Male gender.
Cervix Uterus Adnexa

Cervical Length:    3.2       cm

Cervix:       Normal appearance by transabdominal scan.
Appears closed, without funnelling.
Impression

SIUP at 25+6 weeks
Normal interval anatomy; anatomic survey complete
Normal amniotic fluid volume
Appropriate interval growth with EFW at the 30th %tile
Anterior placenta with SCH at superior margin of placenta
measuring 8 x 1.3 x 3.4 cms (same one as on prior US)
Recommendations

Follow-up ultrasound for growth in 3 weeks

## 2017-04-10 ENCOUNTER — Encounter (HOSPITAL_BASED_OUTPATIENT_CLINIC_OR_DEPARTMENT_OTHER): Payer: Self-pay | Admitting: Emergency Medicine

## 2017-04-10 ENCOUNTER — Emergency Department (HOSPITAL_BASED_OUTPATIENT_CLINIC_OR_DEPARTMENT_OTHER)
Admission: EM | Admit: 2017-04-10 | Discharge: 2017-04-10 | Disposition: A | Discharge status: Home / Self Care | Payer: Self-pay | Attending: Emergency Medicine | Admitting: Emergency Medicine

## 2017-04-10 DIAGNOSIS — K011 Impacted teeth: Secondary | ICD-10-CM | POA: Insufficient documentation

## 2017-04-10 DIAGNOSIS — Z791 Long term (current) use of non-steroidal anti-inflammatories (NSAID): Secondary | ICD-10-CM | POA: Insufficient documentation

## 2017-04-10 DIAGNOSIS — D649 Anemia, unspecified: Secondary | ICD-10-CM | POA: Insufficient documentation

## 2017-04-10 DIAGNOSIS — Z79899 Other long term (current) drug therapy: Secondary | ICD-10-CM | POA: Insufficient documentation

## 2017-04-10 DIAGNOSIS — K0889 Other specified disorders of teeth and supporting structures: Secondary | ICD-10-CM

## 2017-04-10 MED ORDER — HYDROCODONE-ACETAMINOPHEN 5-325 MG PO TABS: 1.0000 | ORAL_TABLET | Freq: Four times a day (QID) | ORAL | 0 refills | Status: AC | PRN

## 2017-04-10 NOTE — ED Triage Notes (Signed)
Pt c/o dental pain. Pt is currently taking amoxicillin and ibuprofen prescribed by dentist for impacted wisdom teeth. Pt has follow up with dentist x 1 week.

## 2017-04-10 NOTE — ED Provider Notes (Addendum)
[facility] Provider Note: Carrie Dell, MD, FACEP  CSN: [id number] MRN: [id number] ARRIVAL: 04/10/17 at 0253 ROOM: [facility]   CHIEF COMPLAINT  Dental Pain   HISTORY OF PRESENT ILLNESS  04/10/17 4:54 AM Carrie Bean is a 31 y.o. female who has been having pain in her impacted wisdom teeth for some time.  She was seen by her dentist about a week ago and placed on amoxicillin and ibuprofen.  Over the past 2 days her pain has worsened and she now states it is a 10 out of 10.  Pain is worse with eating.  The pain is worse on the right side than the left.  She is waiting for a referral to an oral surgeon but states she does not have insurance.  She has not been referred yet.   Consultation with the [facility] state controlled substances database reveals the patient has received no opioid pain prescriptions in the past year..   Past Medical History:  Diagnosis Date  . Abnormal Pap smear    f/u was normal  . Anemia   . Chlamydia   . Gonorrhea   . Preterm labor   . Urinary tract infection     Past Surgical History:  Procedure Laterality Date  . NO PAST SURGERIES    . TUBAL LIGATION Bilateral 04/24/2015   Procedure: POST PARTUM TUBAL LIGATION WITH FILSHIE CLIPS;  Surgeon: Adam Phenix, MD;  Location: [facility];  Service: Gynecology;  Laterality: Bilateral;    Family History  Problem Relation Age of Onset  . Hyperlipidemia Mother   . Asthma Daughter   . Asthma Son   . Hypertension Maternal Grandmother   . Stroke Maternal Grandmother   . Other Neg Hx   . Hearing loss Neg Hx     Social History  Substance Use Topics  . Smoking status: Never Smoker  . Smokeless tobacco: Never Used  . Alcohol use No     Comment: occ    Prior to Admission medications   Medication Sig Start Date End Date Taking? Authorizing Provider  amoxicillin (AMOXIL) 500 MG tablet Take 500 mg by mouth 2 (two) times daily.   Yes [provider]  ibuprofen (ADVIL,MOTRIN)  800 MG tablet Take 800 mg by mouth every 8 (eight) hours as needed.   Yes [provider]    Allergies Patient has no known allergies.   REVIEW OF SYSTEMS  Negative except as noted here or in the History of Present Illness.   PHYSICAL EXAMINATION  Initial Vital Signs Blood pressure 116/77, pulse 83, temperature 98 F (36.7 C), temperature source Oral, resp. rate 16, height 5\' 10"  (1.778 m), weight 64.9 kg (143 lb), SpO2 100 %, currently breastfeeding.  Examination General: Well-developed, well-nourished female in no acute distress; appearance consistent with age of record HENT: normocephalic; atraumatic; good dentition; third molars appear impacted Eyes: pupils equal, round and reactive to light; extraocular muscles intact Neck: supple; no lymphadenopathy Heart: regular rate and rhythm Lungs: clear to auscultation bilaterally Abdomen: soft; nondistended; nontender; bowel sounds present Extremities: No deformity; full range of motion Neurologic: Awake, alert and oriented; motor function intact in all extremities and symmetric; no facial droop Skin: Warm and dry Psychiatric: Normal mood and affect   RESULTS  Summary of this visit's results, reviewed by myself:   EKG Interpretation  Date/Time:    Ventricular Rate:    PR Interval:    QRS Duration:   QT Interval:    QTC Calculation:  R Axis:     Text Interpretation:        Laboratory Studies: No results found for this or any previous visit (from the past 24 hour(s)). Imaging Studies: No results found.  ED COURSE  Nursing notes and initial vitals signs, including pulse oximetry, reviewed.  Vitals:   04/10/17 0300  BP: 116/77  Pulse: 83  Resp: 16  Temp: 98 F (36.7 C)  TempSrc: Oral  SpO2: 100%  Weight: 64.9 kg (143 lb)  Height: 5\' 10"  (1.778 m)    PROCEDURES    ED DIAGNOSES     ICD-10-CM   1. Impacted third molar tooth K01.1   2. Pain, dental K08.89        Paula LibraMolpus, Tameeka Luo, MD 04/10/17  0503    Paula LibraMolpus, Mitsuko Luera, MD 04/10/17 [phone]

## 2017-04-10 NOTE — ED Notes (Signed)
Advised pt of extended wait time to see MD of approximately 2 hours. Pt verbalized understanding.  

## 2017-10-30 ENCOUNTER — Emergency Department (HOSPITAL_BASED_OUTPATIENT_CLINIC_OR_DEPARTMENT_OTHER)
Admission: EM | Admit: 2017-10-30 | Discharge: 2017-10-30 | Disposition: A | Discharge status: Home / Self Care | Payer: Self-pay | Attending: Emergency Medicine | Admitting: Emergency Medicine

## 2017-10-30 ENCOUNTER — Encounter (HOSPITAL_BASED_OUTPATIENT_CLINIC_OR_DEPARTMENT_OTHER): Payer: Self-pay | Admitting: Emergency Medicine

## 2017-10-30 ENCOUNTER — Emergency Department (HOSPITAL_BASED_OUTPATIENT_CLINIC_OR_DEPARTMENT_OTHER): Payer: Self-pay

## 2017-10-30 ENCOUNTER — Other Ambulatory Visit: Payer: Self-pay

## 2017-10-30 DIAGNOSIS — J029 Acute pharyngitis, unspecified: Secondary | ICD-10-CM | POA: Insufficient documentation

## 2017-10-30 DIAGNOSIS — J069 Acute upper respiratory infection, unspecified: Secondary | ICD-10-CM | POA: Insufficient documentation

## 2017-10-30 DIAGNOSIS — B9789 Other viral agents as the cause of diseases classified elsewhere: Secondary | ICD-10-CM | POA: Insufficient documentation

## 2017-10-30 MED ORDER — ALBUTEROL SULFATE HFA 108 (90 BASE) MCG/ACT IN AERS
1.0000 | INHALATION_SPRAY | RESPIRATORY_TRACT | Status: DC | PRN
  Administered 2017-10-30: 1 via RESPIRATORY_TRACT
  Filled 2017-10-30: qty 6.7

## 2017-10-30 MED ORDER — GUAIFENESIN-CODEINE 100-10 MG/5ML PO SYRP: 5.0000 mL | ORAL_SOLUTION | Freq: Three times a day (TID) | ORAL | 0 refills | Status: AC | PRN

## 2017-10-30 NOTE — ED Triage Notes (Signed)
Sore throat and cough x weeks.

## 2017-10-30 NOTE — ED Provider Notes (Signed)
[facility] EMERGENCY DEPARTMENT Provider Note   CSN: [id number] Arrival date & time: 10/30/17  1000     History   Chief Complaint Chief Complaint  Patient presents with  . Cough  . Sore Throat    HPI Carrie Bean is a 32 y.o. female.  HPI   Carrie Bean is a 32 year old female with no significant past medical history who presents to the emergency department for evaluation of cough and sore throat.  Patient reports that her cough has been persistent for over a week now.  She reports it was initially productive of a green sputum, but has become dry.  She reports some chest soreness with cough only, denies pain outside of cough.  Also reports some intermittent wheezing, denies history of asthma or needing to use an inhaler in the past.  She also reports throat pain which is described as "sore."  States that throat pain is worse at nighttime and in the morning and also after coughing.  Reports pain is about 6/10 in severity at this time.  She had some congestion and rhinorrhea previously, but this is since resolved.  Has had some intermittent right sided ear pain.  She denies fevers, chills, headache, neck pain, sinus pain, dysphagia, shortness of breath, abdominal pain, nausea/vomiting.  She has been taking over-the-counter cough and cold medicine without relief.  Her child at home was recently diagnosed with pneumonia.  No other known sick contacts.  She works in [employer]nel [employer] and is concerned about going back to work given her consistent symptoms.  Past Medical History:  Diagnosis Date  . Abnormal Pap smear    f/u was normal  . Anemia   . Chlamydia   . Gonorrhea   . Preterm labor   . Urinary tract infection     There are no active problems to display for this patient.   Past Surgical History:  Procedure Laterality Date  . NO PAST SURGERIES    . TUBAL LIGATION Bilateral 04/24/2015   Procedure: POST PARTUM TUBAL LIGATION WITH FILSHIE CLIPS;  Surgeon: Adam Phenix, MD;  Location: [facility];  Service: Gynecology;  Laterality: Bilateral;     OB History    Gravida  5   Para  5   Term  3   Preterm  2   AB  0   Living  5     SAB  0   TAB  0   Ectopic  0   Multiple  0   Live Births  5            Home Medications    Prior to Admission medications   Medication Sig Start Date End Date Taking? Authorizing Provider  amoxicillin (AMOXIL) 500 MG tablet Take 500 mg by mouth 2 (two) times daily.    [provider]  HYDROcodone-acetaminophen (NORCO) 5-325 MG tablet Take 1 tablet by mouth every 6 (six) hours as needed (for pain). 04/10/17   Molpus, John, MD  ibuprofen (ADVIL,MOTRIN) 800 MG tablet Take 800 mg by mouth every 8 (eight) hours as needed.    [provider]    Family History Family History  Problem Relation Age of Onset  . Hyperlipidemia Mother   . Asthma Daughter   . Asthma Son   . Hypertension Maternal Grandmother   . Stroke Maternal Grandmother   . Other Neg Hx   . Hearing loss Neg Hx     Social History Social History   Tobacco Use  .  Smoking status: Never Smoker  . Smokeless tobacco: Never Used  Substance Use Topics  . Alcohol use: No    Comment: occ  . Drug use: No     Allergies   Patient has no known allergies.   Review of Systems Review of Systems  Constitutional: Negative for chills and fever.  HENT: Positive for ear pain (right intermittent ear pain) and sore throat. Negative for congestion, rhinorrhea, sinus pressure, sinus pain and trouble swallowing.   Respiratory: Positive for cough and wheezing. Negative for shortness of breath.   Cardiovascular: Positive for chest pain (with cough only).  Gastrointestinal: Negative for abdominal pain, nausea and vomiting.  Musculoskeletal: Negative for neck pain and neck stiffness.  Skin: Negative for rash.  Neurological: Negative for headaches.  Psychiatric/Behavioral: Negative for agitation.     Physical Exam Updated Vital  Signs BP 131/82 (BP Location: Right Arm)   Pulse 93   Temp 99.5 F (37.5 C) (Oral)   Resp 18   Ht  (1.778 m)   Wt 64.4 kg (142 lb)   LMP 10/28/2017   SpO2 100%   BMI 20.37 kg/m   Physical Exam  Constitutional: She is oriented to person, place, and time. She appears well-developed and well-nourished. No distress.  Sitting at bedside in no apparent distress, nontoxic-appearing.  HENT:  Head: Normocephalic and atraumatic.  Right Ear: Tympanic membrane and ear canal normal.  Left Ear: Tympanic membrane and ear canal normal.  Mucous memories moist.  Posterior oropharynx without erythema.  No tonsillar swelling or exudate.  Uvula midline.  Airway patent and able to handle oral secretions.  No tenderness over the frontal or maxillary sinuses.  Eyes: Pupils are equal, round, and reactive to light. Conjunctivae are normal. Right eye exhibits no discharge. Left eye exhibits no discharge.  Neck: Normal range of motion. Neck supple.  No nuchal rigidity.  Cardiovascular: Normal rate, regular rhythm and intact distal pulses.  No murmur heard. Pulmonary/Chest: Effort normal and breath sounds normal. No stridor. No respiratory distress. She has no wheezes. She has no rales.  No respiratory distress, speaking in full sentences.  Lungs clear to auscultation.  Abdominal: Soft. Bowel sounds are normal. There is no tenderness.  Musculoskeletal: Normal range of motion.  Lymphadenopathy:    She has no cervical adenopathy.  Neurological: She is alert and oriented to person, place, and time. Coordination normal.  Skin: Skin is warm and dry. She is not diaphoretic.  Psychiatric: She has a normal mood and affect. Her behavior is normal.  Nursing note and vitals reviewed.    ED Treatments / Results  Labs (all labs ordered are listed, but only abnormal results are displayed) Labs Reviewed - No data to display  EKG None  Radiology Dg Chest 2 View  Result Date: 10/30/2017 CLINICAL DATA:   Cough for 2 weeks with congestion EXAM: CHEST - 2 VIEW COMPARISON:  None. FINDINGS: Minimal S-shaped thoracolumbar spine curvature. Midline trachea. Normal heart size and mediastinal contours. No pleural effusion or pneumothorax. Clear lungs. IMPRESSION: Normal chest. Electronically Signed   By: Jeronimo Greaves M.D.   On: 10/30/2017 11:48    Procedures Procedures (including critical care time)  Medications Ordered in ED Medications - No data to display   Initial Impression / Assessment and Plan / ED Course  I have reviewed the triage vital signs and the nursing notes.  Pertinent labs & imaging results that were available during my care of the patient were reviewed by me and considered in  my medical decision making (see chart for details).     Pt CXR negative for acute infiltrate. Patients symptoms are consistent with URI, likely viral etiology.  No signs of otitis on exam.  Discussed that antibiotics are not indicated for viral infections.  She does report some wheezing, no wheezing auscultated on exam.  Will discharge with albuterol inhaler as needed and cough medicine.  Discussed warm liquids, salt water gargles and over-the-counter throat lozenges for throat pain.  Counseled her on reasons to return to the emergency department.  She verbalizes understanding and is agreeable with plan. Pt is hemodynamically stable & in NAD prior to dc.   Final Clinical Impressions(s) / ED Diagnoses   Final diagnoses:  Viral URI with cough    ED Discharge Orders    None       Lawrence Marseilles 10/30/17 1300    Tilden Fossa, MD 10/31/17 [phone]

## 2017-10-30 NOTE — Discharge Instructions (Addendum)
Your chest x-ray was reassuring.  No pneumonia.  Please use over-the-counter throat lozenges and warm liquids to help soothe the back of the throat.  I written you a prescription for cough medicine with codeine in it.  Remember that this medicine can make you drowsy so please do not drive or work while taking it.  Please return to the emergency department if you have any new or concerning symptoms like trouble breathing, wheezing that does not improve with inhaler, chest pain outside of cough, fever greater than 100.4 F.

## 2020-08-06 ENCOUNTER — Other Ambulatory Visit: Payer: Self-pay

## 2020-08-06 ENCOUNTER — Encounter (HOSPITAL_BASED_OUTPATIENT_CLINIC_OR_DEPARTMENT_OTHER): Payer: Self-pay | Admitting: *Deleted

## 2020-08-06 ENCOUNTER — Emergency Department (HOSPITAL_BASED_OUTPATIENT_CLINIC_OR_DEPARTMENT_OTHER)
Admission: EM | Admit: 2020-08-06 | Discharge: 2020-08-06 | Disposition: A | Discharge status: Home / Self Care | Payer: Self-pay | Attending: Emergency Medicine | Admitting: Emergency Medicine

## 2020-08-06 ENCOUNTER — Other Ambulatory Visit (HOSPITAL_COMMUNITY): Payer: Self-pay | Admitting: Emergency Medicine

## 2020-08-06 DIAGNOSIS — N3001 Acute cystitis with hematuria: Secondary | ICD-10-CM | POA: Insufficient documentation

## 2020-08-06 DIAGNOSIS — R300 Dysuria: Secondary | ICD-10-CM

## 2020-08-06 LAB — URINALYSIS, ROUTINE W REFLEX MICROSCOPIC
Bilirubin Urine: NEGATIVE
Glucose, UA: NEGATIVE mg/dL
Ketones, ur: NEGATIVE mg/dL
Nitrite: NEGATIVE
Protein, ur: NEGATIVE mg/dL
Specific Gravity, Urine: 1.025 (ref 1.005–1.030)
pH: 6 (ref 5.0–8.0)

## 2020-08-06 LAB — URINALYSIS, MICROSCOPIC (REFLEX): WBC, UA: >50 WBC/hpf (ref 0–5)

## 2020-08-06 MED ORDER — CEPHALEXIN 500 MG PO CAPS: 500.0000 mg | ORAL_CAPSULE | Freq: Two times a day (BID) | ORAL | 0 refills | Status: AC

## 2020-08-06 MED ORDER — CEPHALEXIN 250 MG PO CAPS
500.0000 mg | ORAL_CAPSULE | Freq: Once | ORAL | Status: AC
  Administered 2020-08-06: 500 mg via ORAL
  Filled 2020-08-06: qty 2

## 2020-08-06 MED FILL — CEPHALEXIN 500 MG CAPSULE: 500 | 5 days supply | Qty: 10 | Fill #0

## 2020-08-06 NOTE — Discharge Instructions (Signed)
It is important you complete the FULL course of antibiotics as prescribed.  Do not stop taking them early even if you feel better.

## 2020-08-06 NOTE — ED Provider Notes (Signed)
[facility] EMERGENCY DEPARTMENT Provider Note   CSN: [id number] Arrival date & time: 08/06/20  2458     History Chief Complaint  Patient presents with  . Urinary Urgency    Carrie Bean is a 35 y.o. female presented to the ED with dysuria for 1 day.  She reports she had a UTI about a month ago and the symptoms are very similar.  She is to get urine infections all the time in the past.  She describes some pressure sensation of urinary frequency for the past 1 to 2 days.  She denies fevers or chills or vomiting.  She denies flank pain.  She says a month ago when she last had her UTI she did not complete the full antibiotic course because she was feeling better and her dysuria resolved.  She thinks she may have been on Keflex.  No known drug allergies.  HPI     Past Medical History:  Diagnosis Date  . Abnormal Pap smear    f/u was normal  . Anemia   . Chlamydia   . Gonorrhea   . Preterm labor   . Urinary tract infection     There are no problems to display for this patient.   Past Surgical History:  Procedure Laterality Date  . NO PAST SURGERIES    . TUBAL LIGATION Bilateral 04/24/2015   Procedure: POST PARTUM TUBAL LIGATION WITH FILSHIE CLIPS;  Surgeon: Adam Phenix, MD;  Location: [facility];  Service: Gynecology;  Laterality: Bilateral;     OB History    Gravida  5   Para  5   Term  3   Preterm  2   AB  0   Living  5     SAB  0   IAB  0   Ectopic  0   Multiple  0   Live Births  5           Family History  Problem Relation Age of Onset  . Hyperlipidemia Mother   . Asthma Daughter   . Asthma Son   . Hypertension Maternal Grandmother   . Stroke Maternal Grandmother   . Other Neg Hx   . Hearing loss Neg Hx     Social History   Tobacco Use  . Smoking status: Never Smoker  . Smokeless tobacco: Never Used  Substance Use Topics  . Alcohol use: No    Comment: occ  . Drug use: No    Home Medications Prior to Admission  medications   Medication Sig Start Date End Date Taking? Authorizing Provider  cephALEXin (KEFLEX) 500 MG capsule Take 1 capsule (500 mg total) by mouth 2 (two) times daily for 5 days. 08/06/20 08/11/20 Yes Vilda Zollner, Kermit Balo, MD  amoxicillin (AMOXIL) 500 MG tablet Take 500 mg by mouth 2 (two) times daily.    [provider]  guaiFENesin-codeine (ROBITUSSIN AC) 100-10 MG/5ML syrup Take 5 mLs by mouth 3 (three) times daily as needed for cough. 10/30/17   Kellie Shropshire, PA-C  HYDROcodone-acetaminophen (NORCO) 5-325 MG tablet Take 1 tablet by mouth every 6 (six) hours as needed (for pain). 04/10/17   Molpus, John, MD  ibuprofen (ADVIL,MOTRIN) 800 MG tablet Take 800 mg by mouth every 8 (eight) hours as needed.    [provider]    Allergies    Patient has no known allergies.  Review of Systems   Review of Systems  Constitutional: Negative for chills and fever.  Respiratory: Negative  for cough and shortness of breath.   Cardiovascular: Negative for chest pain and palpitations.  Gastrointestinal: Negative for abdominal pain and vomiting.  Genitourinary: Positive for dysuria. Negative for flank pain and hematuria.  Musculoskeletal: Negative for arthralgias and back pain.  Skin: Negative for color change and rash.  Neurological: Negative for syncope, weakness and light-headedness.  All other systems reviewed and are negative.   Physical Exam Updated Vital Signs BP 128/86 (BP Location: Right Arm)   Pulse 88   Temp 98.4 F (36.9 C) (Oral)   Resp 16   Ht 5\' 10"  (1.778 m)   Wt 71.7 kg   LMP 07/30/2020 (Exact Date)   SpO2 100%   BMI 22.67 kg/m   Physical Exam Constitutional:      General: She is not in acute distress. HENT:     Head: Normocephalic and atraumatic.  Eyes:     Conjunctiva/sclera: Conjunctivae normal.     Pupils: Pupils are equal, round, and reactive to light.  Cardiovascular:     Rate and Rhythm: Normal rate and regular rhythm.  Pulmonary:      Effort: Pulmonary effort is normal. No respiratory distress.  Skin:    General: Skin is warm and dry.  Neurological:     General: No focal deficit present.     Mental Status: She is alert. Mental status is at baseline.  Psychiatric:        Mood and Affect: Mood normal.        Behavior: Behavior normal.     ED Results / Procedures / Treatments   Labs (all labs ordered are listed, but only abnormal results are displayed) Labs Reviewed  URINALYSIS, ROUTINE W REFLEX MICROSCOPIC - Abnormal; Notable for the following components:      Result Value   Color, Urine STRAW (*)    APPearance HAZY (*)    Hgb urine dipstick TRACE (*)    Leukocytes,Ua MODERATE (*)    All other components within normal limits  URINALYSIS, MICROSCOPIC (REFLEX) - Abnormal; Notable for the following components:   Bacteria, UA FEW (*)    Non Squamous Epithelial PRESENT (*)    All other components within normal limits  URINE CULTURE    EKG None  Radiology No results found.  Procedures Procedures   Medications Ordered in ED Medications  cephALEXin (KEFLEX) capsule 500 mg (500 mg Oral Given 08/06/20 1044)    ED Course  I have reviewed the triage vital signs and the nursing notes.  Pertinent labs & imaging results that were available during my care of the patient were reviewed by me and considered in my medical decision making (see chart for details).   35 yo female here with dysuria - likely UTI per her history Will check UA, consider tx with antibiotics, send urine culture if needed  Doubt PID, pyelonephritis, sepsis, or significant intraabdominal infection She is well appearing on exam      Final Clinical Impression(s) / ED Diagnoses Final diagnoses:  Dysuria  Acute cystitis with hematuria    Rx / DC Orders ED Discharge Orders         Ordered    cephALEXin (KEFLEX) 500 MG capsule  2 times daily        08/06/20 1040           10/06/20, MD 08/06/20 1109

## 2020-08-06 NOTE — ED Triage Notes (Signed)
Urinary urgency and decreased output x 2 days  Denies pain denies burning w urination  States feels the same as past uti

## 2020-08-07 LAB — URINE CULTURE

## 2021-06-17 ENCOUNTER — Other Ambulatory Visit (HOSPITAL_BASED_OUTPATIENT_CLINIC_OR_DEPARTMENT_OTHER): Payer: Self-pay

## 2021-06-17 ENCOUNTER — Emergency Department (HOSPITAL_BASED_OUTPATIENT_CLINIC_OR_DEPARTMENT_OTHER)
Admission: EM | Admit: 2021-06-17 | Discharge: 2021-06-17 | Disposition: A | Discharge status: Home / Self Care | Payer: Self-pay | Attending: Emergency Medicine | Admitting: Emergency Medicine

## 2021-06-17 ENCOUNTER — Encounter (HOSPITAL_BASED_OUTPATIENT_CLINIC_OR_DEPARTMENT_OTHER): Payer: Self-pay | Admitting: *Deleted

## 2021-06-17 ENCOUNTER — Other Ambulatory Visit: Payer: Self-pay

## 2021-06-17 DIAGNOSIS — R3915 Urgency of urination: Secondary | ICD-10-CM

## 2021-06-17 DIAGNOSIS — N760 Acute vaginitis: Secondary | ICD-10-CM | POA: Insufficient documentation

## 2021-06-17 DIAGNOSIS — B9689 Other specified bacterial agents as the cause of diseases classified elsewhere: Secondary | ICD-10-CM

## 2021-06-17 DIAGNOSIS — R350 Frequency of micturition: Secondary | ICD-10-CM

## 2021-06-17 LAB — URINALYSIS, ROUTINE W REFLEX MICROSCOPIC
Bilirubin Urine: NEGATIVE
Glucose, UA: NEGATIVE mg/dL
Ketones, ur: NEGATIVE mg/dL
Leukocytes,Ua: NEGATIVE
Nitrite: NEGATIVE
Protein, ur: NEGATIVE mg/dL
Specific Gravity, Urine: 1.025 (ref 1.005–1.030)
pH: 7.5 (ref 5.0–8.0)

## 2021-06-17 LAB — WET PREP, GENITAL
Sperm: NONE SEEN
Trich, Wet Prep: NONE SEEN
WBC, Wet Prep HPF POC: >=10 — AB (ref <10)
Yeast Wet Prep HPF POC: NONE SEEN

## 2021-06-17 LAB — URINALYSIS, MICROSCOPIC (REFLEX)

## 2021-06-17 LAB — PREGNANCY, URINE: Preg Test, Ur: NEGATIVE

## 2021-06-17 MED ORDER — METRONIDAZOLE 500 MG PO TABS
500.0000 mg | ORAL_TABLET | Freq: Two times a day (BID) | ORAL | 0 refills | Status: AC
  Filled 2021-06-17: qty 14, 7d supply, fill #0

## 2021-06-17 NOTE — Discharge Instructions (Addendum)
You were seen in today for evaluation of your urinary urgency and frequency.  Your urinalysis was normal.  I think that your urgency and frequency is likely due to pelvic floor dysfunction.  Follow-up with your gynecologist, if you do not have one a referral for one has been included in his discharge paperwork you will receive a call them.  Incidentally, it was discovered that he had bacterial vaginosis.  Metronidazole is an antibiotic use for this.  Please see this further at the next week.  This medication in combination with alcohol can cause severe abdominal pain and side effects.  Please do not drink while on this medication.  If you have any concern, new or worsening symptoms, please return to the nearest emergency department for evaluation.

## 2021-06-17 NOTE — ED Provider Notes (Signed)
[facility] EMERGENCY DEPARTMENT Provider Note   CSN: [id number] Arrival date & time: 06/17/21  1551     History Chief Complaint  Patient presents with   Recurrent UTI    Carrie Bean is a 36 y.o. female presents to the ED for evaluation of urinary urgency and frequency for the past 10 days. The patient reports that she has had more urinary urgency/frequency since her BTL 6 years, but it has worsened over the past 10 days. She denies any dysuria, hematuria, abdominal pain, nausea, fever, diarrhea, constipation, back pain, flank pain, abnormal vaginal discharge, or vaginal bleeding.  She denies any trauma to the area.  She denies any concern for STDs.  Medical history includes anemia.  Surgical history includes bilateral tubal ligation.  No allergies.  No daily medications.  Denies any tobacco, EtOH, illicit drug use ever.  HPI     Home Medications Prior to Admission medications   Medication Sig Start Date End Date Taking? Authorizing Provider  metroNIDAZOLE (FLAGYL) 500 MG tablet Take 1 tablet (500 mg total) by mouth 2 (two) times daily. 06/17/21  Yes Sherrell Puller, PA-C  guaiFENesin-codeine (ROBITUSSIN AC) 100-10 MG/5ML syrup Take 5 mLs by mouth 3 (three) times daily as needed for cough. 10/30/17   Glyn Ade, PA-C  HYDROcodone-acetaminophen (NORCO) 5-325 MG tablet Take 1 tablet by mouth every 6 (six) hours as needed (for pain). 04/10/17   Molpus, John, MD  ibuprofen (ADVIL,MOTRIN) 800 MG tablet Take 800 mg by mouth every 8 (eight) hours as needed.    [provider]      Allergies    Patient has no known allergies.    Review of Systems   Review of Systems  Constitutional:  Negative for chills and fever.  HENT:  Negative for ear pain and sore throat.   Eyes:  Negative for pain and visual disturbance.  Respiratory:  Negative for cough and shortness of breath.   Cardiovascular:  Negative for chest pain and palpitations.  Gastrointestinal:  Negative  for abdominal pain and vomiting.  Endocrine: Negative for polyuria.  Genitourinary:  Positive for frequency and urgency. Negative for decreased urine volume, difficulty urinating, dysuria, enuresis, flank pain, hematuria, pelvic pain, vaginal bleeding and vaginal discharge.  Musculoskeletal:  Negative for arthralgias and back pain.  Skin:  Negative for color change and rash.  Neurological:  Negative for seizures and syncope.  All other systems reviewed and are negative.  Physical Exam Updated Vital Signs BP 124/81 (BP Location: Left Arm)    Pulse 100    Temp 98.6 F (37 C) (Oral)    Resp 18    Ht 5\' 10"  (1.778 m)    Wt 71.7 kg    LMP 05/22/2021    SpO2 100%    BMI 22.68 kg/m  Physical Exam Vitals and nursing note reviewed. Exam conducted with a chaperone present Pharmacist, hospital, RN).  Constitutional:      General: She is not in acute distress.    Appearance: Normal appearance. She is not ill-appearing or toxic-appearing.  HENT:     Head: Normocephalic and atraumatic.  Eyes:     General: No scleral icterus. Cardiovascular:     Rate and Rhythm: Normal rate and regular rhythm.  Pulmonary:     Effort: Pulmonary effort is normal. No respiratory distress.     Breath sounds: Normal breath sounds.  Abdominal:     General: Abdomen is flat. Bowel sounds are normal.     Palpations: Abdomen is soft.  Tenderness: There is no abdominal tenderness. There is no right CVA tenderness, left CVA tenderness, guarding or rebound.  Genitourinary:    Exam position: Lithotomy position.     Labia:        Right: No rash, tenderness, lesion or injury.        Left: No rash, tenderness, lesion or injury.      Urethra: No prolapse, urethral swelling or urethral lesion.     Vagina: Vaginal discharge and bleeding present. No tenderness.     Cervix: Discharge present. No cervical motion tenderness, friability, lesion or erythema.     Comments: Clear mucous discharge with some darker blood. The patient reports  this is around the time she is supposed to start her menstrual period. No CMT or lesions seen on the cervix or vaginal canal. No mucopurulent discharge noted.  Musculoskeletal:        General: No deformity.     Cervical back: Normal range of motion.  Skin:    General: Skin is warm and dry.  Neurological:     General: No focal deficit present.     Mental Status: She is alert. Mental status is at baseline.    ED Results / Procedures / Treatments   Labs (all labs ordered are listed, but only abnormal results are displayed) Labs Reviewed  WET PREP, GENITAL - Abnormal; Notable for the following components:      Result Value   Clue Cells Wet Prep HPF POC PRESENT (*)    WBC, Wet Prep HPF POC >=10 (*)    All other components within normal limits  URINALYSIS, ROUTINE W REFLEX MICROSCOPIC - Abnormal; Notable for the following components:   Hgb urine dipstick TRACE (*)    All other components within normal limits  URINALYSIS, MICROSCOPIC (REFLEX) - Abnormal; Notable for the following components:   Bacteria, UA RARE (*)    All other components within normal limits  PREGNANCY, URINE  GC/CHLAMYDIA PROBE AMP ([place]) NOT AT [facility]    EKG None  Radiology No results found.  Procedures Procedures  Hemodynamically stable  Medications Ordered in ED Medications - No data to display  ED Course/ Medical Decision Making/ A&P                           Medical Decision Making  36 year old female presents emergency department for worsening urgency and frequency over the past 10 days.  Differential diagnosis includes but is not limited to pelvic floor dysfunction, UTI, renal stone, STD, prolapse.  Vital signs unremarkable.  Physical exam unremarkable, no abdominal tenderness palpation no CVA tenderness.  Pelvic exam showed some clear mucus discharge along with some scant amount of dark blood.  Patient reports she is not start her period soon.  No CMT tenderness.  No lesions visualized.  No  mucopurulent discharge noted.  Labs ordered.  Urinalysis shows trace blood, however patient has on the beginning of her cycle, otherwise normal.  No nitrates leukocytes seen.  Rare bacteria, however 0-5 white blood cells seen.  Negative hCG.  Wet prep and GC chlamydia obtained.  Wet prep shows clue cells present as well as greater than 10 white blood cells.  Gonorrhea and Chlamydia still pending.   Overall, I think her symptoms are related to stress incontinence as she has given vaginal births to 5 children.  Recommended follow-up with her gynecologist and to perform Kegel exercises to strengthen her pelvic floor.  Incidentally, found bacterial vaginosis on  exam.  Will treat with metronidazole.  Warned patient of the disulfiram effects of metronidazole with alcohol.  Recommended follow-up with gynecologist.  I recommended that she follow-up with her gonorrhea chlamydia results on her MyChart and advised her that if something was positive so we will call in some medication, however she should still check in the next few days.  Strict return precautions given.  Patient is a plan.  Patient is stable be discharged home in good condition.   Final Clinical Impression(s) / ED Diagnoses Final diagnoses:  Bacterial vaginosis  Urinary urgency  Urinary frequency    Rx / DC Orders ED Discharge Orders          Ordered    metroNIDAZOLE (FLAGYL) 500 MG tablet  2 times daily        06/17/21 1734              Sherrell Puller, PA-C 06/17/21 [place], [place], DO 06/17/21 1934

## 2021-06-17 NOTE — ED Triage Notes (Signed)
Urinary frequency and urgency with scanty urine output for a week.

## 2021-06-18 LAB — GC/CHLAMYDIA PROBE AMP (~~LOC~~) NOT AT ARMC
Chlamydia: NEGATIVE
Comment: NEGATIVE
Comment: NORMAL
Neisseria Gonorrhea: NEGATIVE

## 2022-09-25 ENCOUNTER — Encounter (HOSPITAL_BASED_OUTPATIENT_CLINIC_OR_DEPARTMENT_OTHER): Payer: Self-pay | Admitting: Emergency Medicine

## 2022-09-25 ENCOUNTER — Other Ambulatory Visit: Payer: Self-pay

## 2022-09-25 ENCOUNTER — Emergency Department (HOSPITAL_BASED_OUTPATIENT_CLINIC_OR_DEPARTMENT_OTHER)
Admission: EM | Admit: 2022-09-25 | Discharge: 2022-09-25 | Disposition: A | Discharge status: Home / Self Care | Payer: Self-pay | Attending: Emergency Medicine | Admitting: Emergency Medicine

## 2022-09-25 DIAGNOSIS — R051 Acute cough: Secondary | ICD-10-CM | POA: Insufficient documentation

## 2022-09-25 DIAGNOSIS — Z1152 Encounter for screening for COVID-19: Secondary | ICD-10-CM | POA: Insufficient documentation

## 2022-09-25 LAB — SARS CORONAVIRUS 2 BY RT PCR: SARS Coronavirus 2 by RT PCR: NEGATIVE

## 2022-09-25 MED ORDER — BENZONATATE 100 MG PO CAPS: 100.0000 mg | ORAL_CAPSULE | Freq: Three times a day (TID) | ORAL | 0 refills | Status: AC

## 2022-09-25 NOTE — ED Triage Notes (Signed)
Cough x 1 week  and lost her voice , has congestion

## 2022-09-25 NOTE — ED Provider Notes (Signed)
[place] [facility] Provider Note   CSN: [id number] Arrival date & time: 09/25/22  0347     History  Chief Complaint  Patient presents with   Cough    Carrie Bean is a 37 y.o. female presented with 6 days of dry cough.  Patient that she went to a concert and since then has been having a dry cough.  Patient also endorsed having laryngitis that she was seen at [facility] earlier this week but not given any steroids.  Patient states that she continues to have a cough but that is not productive.  Patient denies any history of asthma or COPD.  Patient Dors is a dry throat with this nonproductive cough but still able to tolerate food and fluids orally without pain.  Patient does note that she does have seasonal allergies but does not take any antihistamines.  Patient had chest pain, shortness of breath, abdominal pain, nausea/vomiting, leg swelling, recent travel/hospitalization/surgeries/blood clots  Home Medications Prior to Admission medications   Medication Sig Start Date End Date Taking? Authorizing Provider  benzonatate (TESSALON) 100 MG capsule Take 1 capsule (100 mg total) by mouth every 8 (eight) hours. 09/25/22  Yes Boone Gear, Beverly Gust, PA-C  guaiFENesin-codeine (ROBITUSSIN AC) 100-10 MG/5ML syrup Take 5 mLs by mouth 3 (three) times daily as needed for cough. 10/30/17   Kellie Shropshire, PA-C  HYDROcodone-acetaminophen (NORCO) 5-325 MG tablet Take 1 tablet by mouth every 6 (six) hours as needed (for pain). 04/10/17   Molpus, John, MD  ibuprofen (ADVIL,MOTRIN) 800 MG tablet Take 800 mg by mouth every 8 (eight) hours as needed.    [provider]  metroNIDAZOLE (FLAGYL) 500 MG tablet Take 1 tablet (500 mg total) by mouth 2 (two) times daily. 06/17/21   Achille Rich, PA-C      Allergies    Patient has no known allergies.    Review of Systems   Review of Systems  Respiratory:  Positive for cough.   See HPI  Physical  Exam Updated Vital Signs BP 128/76 (BP Location: Left Arm)   Pulse 99   Temp 98.2 F (36.8 C) (Oral)   Resp 17   Ht 5\' 10"  (1.778 m)   Wt 71.7 kg   SpO2 96%   BMI 22.67 kg/m  Physical Exam Vitals reviewed.  Constitutional:      General: She is not in acute distress. HENT:     Head: Normocephalic and atraumatic.     Right Ear: Tympanic membrane, ear canal and external ear normal.     Left Ear: Tympanic membrane, ear canal and external ear normal.     Nose: Nose normal.     Mouth/Throat:     Mouth: Mucous membranes are moist.     Pharynx: No oropharyngeal exudate or posterior oropharyngeal erythema.  Eyes:     Extraocular Movements: Extraocular movements intact.     Conjunctiva/sclera: Conjunctivae normal.     Pupils: Pupils are equal, round, and reactive to light.  Cardiovascular:     Rate and Rhythm: Normal rate and regular rhythm.     Pulses: Normal pulses.     Heart sounds: Normal heart sounds.     Comments: 2+ bilateral radial/dorsalis pedis pulses with regular rate Pulmonary:     Effort: Pulmonary effort is normal. No respiratory distress.     Breath sounds: Normal breath sounds.  Abdominal:     Palpations: Abdomen is soft.     Tenderness: There is  no abdominal tenderness. There is no guarding or rebound.  Musculoskeletal:        General: Normal range of motion.     Cervical back: Normal range of motion and neck supple.     Right lower leg: No edema.     Left lower leg: No edema.     Comments: 5 out of 5 bilateral grip/leg extension strength  Skin:    General: Skin is warm and dry.     Capillary Refill: Capillary refill takes less than 2 seconds.  Neurological:     Mental Status: She is alert.     Comments: Sensation intact in all 4 limbs  Psychiatric:        Mood and Affect: Mood normal.     ED Results / Procedures / Treatments   Labs (all labs ordered are listed, but only abnormal results are displayed) Labs Reviewed  SARS CORONAVIRUS 2 BY RT PCR     EKG None  Radiology No results found.  Procedures Procedures    Medications Ordered in ED Medications - No data to display  ED Course/ Medical Decision Making/ A&P                             Medical Decision Making  Carrie Bean 37 y.o. presented today for dry cough.  Working DDx that I considered at this time includes, but not limited to, seasonal allergies, asthma/COPD exacerbation, URI, viral illness, anemia, ACS, PE, pneumonia, pleural effusion, lung cancer.  R/o DDx: asthma/COPD exacerbation, anemia, ACS, PE, pneumonia, pleural effusion, lung cancer.: These are considered less likely due to history of present illness and physical exam findings  Review of prior external notes: 08/28/2022 ED  Unique Tests and My Interpretation:  COVID: Pending  Discussion with Independent Historian: None  Discussion of Management of Tests: None  Risk: Medium: prescription drug management  Risk Stratification Score:  PERC: 0  Plan: Patient presented for nonproductive cough.  On exam patient was in no acute distress and stable vitals.  Patient was not hypoxic upon arrival requiring oxygen.  Patient denied history of any respiratory diseases.  Patient stated that she has had a dry throat with a dry cough the past few days and notes seasonal allergies but does not take antihistamines and was recently at a concert amongst people that she believes were sick with a viral illness.  Patient that she is most concerned about the cough however on physical exam no cough is noted and patient had clear lungs bilaterally along with normal heart sounds.  Chart states that patient's pulse rate is 99 however in the room heart rate was lower and so I suspect that pulse rate was after he had finished walking to the room as opposed to any cardiopulmonary etiology.  I spoke to the patient about now that we can swab her and discharge her and that she may follow-up with the COVID results on MyChart and  follow-up with her primary care provider.  I will also give the patient Tessalon for her cough and encouraged her to take antihistamines in the morning such as Zyrtec or Claritin as a suspect this is most likely seasonal allergies.  Patient had a PERC score of 0 and will take in consideration patient's history I have low suspicion that patient's symptoms are related to a PE and advanced imaging and a D-dimer would not be ordered today.  Patient was given return precautions. Patient stable for  discharge at this time.  Patient verbalized understanding of plan.         Final Clinical Impression(s) / ED Diagnoses Final diagnoses:  Acute cough    Rx / DC Orders ED Discharge Orders          Ordered    benzonatate (TESSALON) 100 MG capsule  Every 8 hours        09/25/22 0942              Netta Corrigan, PA-C 09/25/22 0944    Terald Sleeper, MD 09/25/22 [phone]

## 2022-09-25 NOTE — Discharge Instructions (Signed)
Please pick up the Tessalon I have prescribed for you to help with your cough.  You may take Claritin or Zyrtec in the morning as this cough may be related to seasonal allergies.  Please follow-up with your COVID results on the MyChart app.  You are outside the window for Paxlovid as symptoms have been going on longer than 5 days and if you test positive you may alternate every 6 hours between ibuprofen and Tylenol and be sure to take in plenty of fluids and food as tolerated.  Please follow-up with primary care provider regarding recent symptoms and ER visit.  If symptoms worsen please return to ER.
